# Patient Record
Sex: Female | Born: 1935
Health system: Southern US, Community
[De-identification: ages and names within clinical notes are randomized; demographics above are authoritative.]

## PROBLEM LIST (undated history)

## (undated) DIAGNOSIS — E559 Vitamin D deficiency, unspecified: Secondary | ICD-10-CM

## (undated) DIAGNOSIS — E039 Hypothyroidism, unspecified: Secondary | ICD-10-CM

## (undated) DIAGNOSIS — C801 Malignant (primary) neoplasm, unspecified: Secondary | ICD-10-CM

## (undated) DIAGNOSIS — K648 Other hemorrhoids: Secondary | ICD-10-CM

## (undated) DIAGNOSIS — I428 Other cardiomyopathies: Secondary | ICD-10-CM

## (undated) DIAGNOSIS — D509 Iron deficiency anemia, unspecified: Secondary | ICD-10-CM

## (undated) DIAGNOSIS — K5732 Diverticulitis of large intestine without perforation or abscess without bleeding: Secondary | ICD-10-CM

## (undated) DIAGNOSIS — C4490 Unspecified malignant neoplasm of skin, unspecified: Secondary | ICD-10-CM

## (undated) DIAGNOSIS — Z90710 Acquired absence of both cervix and uterus: Secondary | ICD-10-CM

## (undated) DIAGNOSIS — Z5111 Encounter for antineoplastic chemotherapy: Secondary | ICD-10-CM

## (undated) DIAGNOSIS — E785 Hyperlipidemia, unspecified: Secondary | ICD-10-CM

## (undated) DIAGNOSIS — I10 Essential (primary) hypertension: Secondary | ICD-10-CM

## (undated) DIAGNOSIS — I447 Left bundle-branch block, unspecified: Secondary | ICD-10-CM

## (undated) DIAGNOSIS — K219 Gastro-esophageal reflux disease without esophagitis: Secondary | ICD-10-CM

## (undated) DIAGNOSIS — C8589 Other specified types of non-Hodgkin lymphoma, extranodal and solid organ sites: Secondary | ICD-10-CM

## (undated) HISTORY — DX: Other specified types of non-hodgkin lymphoma, extranodal and solid organ sites: C85.89

## (undated) HISTORY — DX: Hyperlipidemia, unspecified: E78.5

## (undated) HISTORY — DX: Diverticulitis of large intestine without perforation or abscess without bleeding: K57.32

## (undated) HISTORY — DX: Left bundle-branch block, unspecified: I44.7

## (undated) HISTORY — DX: Other cardiomyopathies: I42.8

## (undated) HISTORY — DX: Gastro-esophageal reflux disease without esophagitis: K21.9

## (undated) HISTORY — DX: Other hemorrhoids: K64.8

## (undated) HISTORY — DX: Malignant (primary) neoplasm, unspecified: C80.1

## (undated) HISTORY — DX: Vitamin D deficiency, unspecified: E55.9

## (undated) HISTORY — DX: Unspecified malignant neoplasm of skin, unspecified: C44.90

## (undated) HISTORY — DX: Essential (primary) hypertension: I10

## (undated) HISTORY — PX: OTHER SURGICAL HISTORY: SHX169

## (undated) HISTORY — PX: TOE SURGERY: SHX1073

## (undated) HISTORY — DX: Hypothyroidism, unspecified: E03.9

## (undated) HISTORY — DX: Iron deficiency anemia, unspecified: D50.9

## (undated) HISTORY — DX: Acquired absence of both cervix and uterus: Z90.710

---
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
## 1975-07-07 DIAGNOSIS — Z90710 Acquired absence of both cervix and uterus: Secondary | ICD-10-CM

## 1975-07-07 HISTORY — DX: Acquired absence of both cervix and uterus: Z90.710

## 2002-12-18 ENCOUNTER — Encounter: Admission: RE | Admit: 2002-12-18 | Discharge: 2002-12-18 | Payer: Self-pay | Admitting: Orthopaedic Surgery

## 2002-12-18 ENCOUNTER — Encounter: Payer: Self-pay | Admitting: Orthopaedic Surgery

## 2002-12-19 ENCOUNTER — Ambulatory Visit (HOSPITAL_BASED_OUTPATIENT_CLINIC_OR_DEPARTMENT_OTHER): Admission: RE | Admit: 2002-12-19 | Discharge: 2002-12-19 | Payer: Self-pay | Admitting: Orthopaedic Surgery

## 2003-04-26 ENCOUNTER — Encounter: Admission: RE | Admit: 2003-04-26 | Discharge: 2003-07-03 | Payer: Self-pay | Admitting: Orthopaedic Surgery

## 2005-05-01 ENCOUNTER — Ambulatory Visit: Payer: Self-pay | Admitting: Cardiology

## 2005-05-19 ENCOUNTER — Ambulatory Visit: Payer: Self-pay

## 2005-05-19 ENCOUNTER — Encounter: Payer: Self-pay | Admitting: Cardiology

## 2006-03-11 ENCOUNTER — Ambulatory Visit: Payer: Self-pay | Admitting: Gastroenterology

## 2006-03-17 ENCOUNTER — Ambulatory Visit: Payer: Self-pay | Admitting: Gastroenterology

## 2006-03-31 ENCOUNTER — Ambulatory Visit: Payer: Self-pay | Admitting: Cardiology

## 2006-04-06 ENCOUNTER — Encounter: Payer: Self-pay | Admitting: Cardiovascular Disease

## 2006-04-06 ENCOUNTER — Ambulatory Visit: Payer: Self-pay

## 2006-12-01 ENCOUNTER — Ambulatory Visit: Payer: Self-pay | Admitting: Gastroenterology

## 2007-04-12 ENCOUNTER — Ambulatory Visit: Payer: Self-pay | Admitting: Cardiology

## 2007-04-12 LAB — CONVERTED CEMR LAB
ALT: 21 units/L (ref 0–35)
Albumin: 3.6 g/dL (ref 3.5–5.2)
BUN: 15 mg/dL (ref 6–23)
Bilirubin, Direct: 0.1 mg/dL (ref 0.0–0.3)
Cholesterol: 112 mg/dL (ref 0–200)
Creatinine, Ser: 0.6 mg/dL (ref 0.4–1.2)
Glucose, Bld: 96 mg/dL (ref 70–99)
HDL: 53.9 mg/dL (ref >39.0)
Potassium: 3.8 meq/L (ref 3.5–5.1)
Sodium: 145 meq/L (ref 135–145)

## 2007-04-19 ENCOUNTER — Ambulatory Visit: Payer: Self-pay | Admitting: Cardiology

## 2007-07-20 ENCOUNTER — Ambulatory Visit: Payer: Self-pay | Admitting: Gastroenterology

## 2007-08-02 ENCOUNTER — Ambulatory Visit: Payer: Self-pay | Admitting: Cardiology

## 2007-08-02 LAB — CONVERTED CEMR LAB
AST: 20 units/L (ref 0–37)
Cholesterol: 222 mg/dL (ref 0–200)
Total CHOL/HDL Ratio: 4.4
Triglycerides: 217 mg/dL (ref 0–149)
VLDL: 43 mg/dL — ABNORMAL HIGH (ref 0–40)

## 2007-09-13 ENCOUNTER — Ambulatory Visit: Payer: Self-pay | Admitting: Cardiology

## 2007-09-13 LAB — CONVERTED CEMR LAB
AST: 18 units/L (ref 0–37)
Albumin: 3.4 g/dL — ABNORMAL LOW (ref 3.5–5.2)
HDL: 54.4 mg/dL (ref >39.0)
Triglycerides: 118 mg/dL (ref 0–149)
VLDL: 24 mg/dL (ref 0–40)

## 2007-12-09 DIAGNOSIS — K5732 Diverticulitis of large intestine without perforation or abscess without bleeding: Secondary | ICD-10-CM | POA: Insufficient documentation

## 2007-12-09 DIAGNOSIS — I428 Other cardiomyopathies: Secondary | ICD-10-CM | POA: Insufficient documentation

## 2007-12-09 DIAGNOSIS — I10 Essential (primary) hypertension: Secondary | ICD-10-CM | POA: Insufficient documentation

## 2007-12-09 DIAGNOSIS — Z8572 Personal history of non-Hodgkin lymphomas: Secondary | ICD-10-CM | POA: Insufficient documentation

## 2007-12-09 DIAGNOSIS — I447 Left bundle-branch block, unspecified: Secondary | ICD-10-CM | POA: Insufficient documentation

## 2007-12-09 DIAGNOSIS — E785 Hyperlipidemia, unspecified: Secondary | ICD-10-CM | POA: Insufficient documentation

## 2007-12-09 DIAGNOSIS — I5020 Unspecified systolic (congestive) heart failure: Secondary | ICD-10-CM | POA: Insufficient documentation

## 2008-04-23 ENCOUNTER — Ambulatory Visit: Payer: Self-pay | Admitting: Cardiology

## 2008-04-23 LAB — CONVERTED CEMR LAB
ALT: 14 units/L (ref 0–35)
Bilirubin, Direct: 0.1 mg/dL (ref 0.0–0.3)
CO2: 32 meq/L (ref 19–32)
Cholesterol: 270 mg/dL (ref 0–200)
Direct LDL: 157.9 mg/dL
Glucose, Bld: 92 mg/dL (ref 70–99)
HDL: 46.5 mg/dL (ref >39.0)
Potassium: 3.9 meq/L (ref 3.5–5.1)
Sodium: 141 meq/L (ref 135–145)
Total Protein: 6.6 g/dL (ref 6.0–8.3)
VLDL: 38 mg/dL (ref 0–40)

## 2008-04-25 ENCOUNTER — Ambulatory Visit: Payer: Self-pay | Admitting: Cardiology

## 2008-06-05 ENCOUNTER — Ambulatory Visit: Payer: Self-pay | Admitting: Cardiology

## 2008-06-05 LAB — CONVERTED CEMR LAB
ALT: 17 units/L (ref 0–35)
AST: 23 units/L (ref 0–37)
Albumin: 3.4 g/dL — ABNORMAL LOW (ref 3.5–5.2)
Cholesterol: 138 mg/dL (ref 0–200)
Total Bilirubin: 0.7 mg/dL (ref 0.3–1.2)

## 2008-11-26 ENCOUNTER — Encounter: Payer: Self-pay | Admitting: Cardiology

## 2009-02-12 ENCOUNTER — Encounter (INDEPENDENT_AMBULATORY_CARE_PROVIDER_SITE_OTHER): Payer: Self-pay | Admitting: *Deleted

## 2009-04-24 ENCOUNTER — Ambulatory Visit: Payer: Self-pay | Admitting: Cardiology

## 2009-04-30 ENCOUNTER — Encounter (INDEPENDENT_AMBULATORY_CARE_PROVIDER_SITE_OTHER): Payer: Self-pay | Admitting: *Deleted

## 2009-04-30 LAB — CONVERTED CEMR LAB
BUN: 14 mg/dL (ref 6–23)
Bilirubin, Direct: 0 mg/dL (ref 0.0–0.3)
Calcium: 9 mg/dL (ref 8.4–10.5)
Cholesterol: 134 mg/dL (ref 0–200)
GFR calc non Af Amer: 102.34 mL/min (ref >60)
Glucose, Bld: 89 mg/dL (ref 70–99)
HDL: 50.3 mg/dL (ref >39.00)
Sodium: 144 meq/L (ref 135–145)
Total Bilirubin: 0.7 mg/dL (ref 0.3–1.2)
Total Protein: 6.2 g/dL (ref 6.0–8.3)
Triglycerides: 132 mg/dL (ref 0.0–149.0)

## 2009-05-02 ENCOUNTER — Ambulatory Visit: Payer: Self-pay | Admitting: Cardiology

## 2009-05-20 ENCOUNTER — Ambulatory Visit: Payer: Self-pay

## 2009-05-20 ENCOUNTER — Ambulatory Visit: Payer: Self-pay | Admitting: Internal Medicine

## 2009-05-20 ENCOUNTER — Encounter: Payer: Self-pay | Admitting: Cardiology

## 2009-05-20 ENCOUNTER — Ambulatory Visit (HOSPITAL_COMMUNITY): Admission: RE | Admit: 2009-05-20 | Discharge: 2009-05-20 | Payer: Self-pay | Admitting: Cardiology

## 2009-05-23 ENCOUNTER — Telehealth (INDEPENDENT_AMBULATORY_CARE_PROVIDER_SITE_OTHER): Payer: Self-pay

## 2009-05-27 ENCOUNTER — Ambulatory Visit: Payer: Self-pay | Admitting: Cardiovascular Disease

## 2009-05-27 ENCOUNTER — Ambulatory Visit: Payer: Self-pay

## 2009-05-27 ENCOUNTER — Encounter (HOSPITAL_COMMUNITY): Admission: RE | Admit: 2009-05-27 | Discharge: 2009-07-03 | Payer: Self-pay | Admitting: Cardiology

## 2009-06-10 ENCOUNTER — Telehealth: Payer: Self-pay | Admitting: Gastroenterology

## 2009-06-11 ENCOUNTER — Ambulatory Visit: Payer: Self-pay | Admitting: Internal Medicine

## 2009-06-11 DIAGNOSIS — K5900 Constipation, unspecified: Secondary | ICD-10-CM | POA: Insufficient documentation

## 2009-06-11 DIAGNOSIS — R12 Heartburn: Secondary | ICD-10-CM | POA: Insufficient documentation

## 2009-06-19 LAB — CONVERTED CEMR LAB
Basophils Absolute: 0.1 10*3/uL (ref 0.0–0.1)
Basophils Relative: 0.6 % (ref 0.0–3.0)
Eosinophils Relative: 0.4 % (ref 0.0–5.0)
MCHC: 34.7 g/dL (ref 30.0–36.0)
MCV: 94.2 fL (ref 78.0–100.0)
Monocytes Absolute: 0.7 10*3/uL (ref 0.1–1.0)
Neutro Abs: 7.6 10*3/uL (ref 1.4–7.7)
RBC: 4.35 M/uL (ref 3.87–5.11)
WBC: 11 10*3/uL — ABNORMAL HIGH (ref 4.5–10.5)

## 2009-07-10 ENCOUNTER — Ambulatory Visit: Payer: Self-pay | Admitting: Gastroenterology

## 2009-07-18 ENCOUNTER — Telehealth: Payer: Self-pay | Admitting: Cardiology

## 2009-09-30 ENCOUNTER — Telehealth: Payer: Self-pay | Admitting: Cardiology

## 2009-10-10 ENCOUNTER — Telehealth: Payer: Self-pay | Admitting: Cardiology

## 2010-02-04 ENCOUNTER — Telehealth: Payer: Self-pay | Admitting: Cardiology

## 2010-02-11 ENCOUNTER — Ambulatory Visit: Payer: Self-pay | Admitting: Cardiology

## 2010-02-12 LAB — CONVERTED CEMR LAB
Calcium: 8.9 mg/dL (ref 8.4–10.5)
GFR calc non Af Amer: 120.47 mL/min (ref >60)
Glucose, Bld: 95 mg/dL (ref 70–99)
Sodium: 140 meq/L (ref 135–145)

## 2010-04-14 ENCOUNTER — Ambulatory Visit: Payer: Self-pay | Admitting: Cardiology

## 2010-04-24 ENCOUNTER — Telehealth: Payer: Self-pay | Admitting: Gastroenterology

## 2010-08-03 LAB — CONVERTED CEMR LAB
CO2: 30 meq/L (ref 19–32)
Chloride: 103 meq/L (ref 96–112)
Glucose, Bld: 83 mg/dL (ref 70–99)
Potassium: 4.4 meq/L (ref 3.5–5.1)
Sodium: 140 meq/L (ref 135–145)

## 2010-08-07 NOTE — Progress Notes (Signed)
Summary: refill-please resend   Phone Note Refill Request Message from:  Patient on July 18, 2009 10:39 AM  Refills Requested: Medication #1:  crestor 5mg  [facility]    Method Requested: Telephone to Pharmacy Initial call taken by: Migdalia Dk,  July 18, 2009 10:40 AM  Follow-up for Phone Call        called in medication to pharmacy 1 year supply. called and told pt it was sent in Follow-up by: Kem Parkinson,  July 18, 2009 11:05 AM

## 2010-08-07 NOTE — Progress Notes (Signed)
Summary: Pt request call  Medications Added LISINOPRIL 10 MG TABS (LISINOPRIL) Take one tablet by mouth daily       Phone Note Call from Patient Call back at [facility] Phone [phone]   Caller: Patient Summary of Call: Pt request call Initial call taken by: Judie Grieve,  February 04, 2010 10:08 AM  Follow-up for Phone Call        [place] Scherrie Bateman, LPN  February 04, 2010 5:34 PM Pt returning call Judie Grieve  February 05, 2010 8:36 AM Left message to call back Deliah Goody, RN  February 05, 2010 2:31 PM  pt is c/o cramping in her feet and legs. sometimes they are so bad her legs are sore the next day.  she has not taken the lisinopril HCT yesterday or today and the cramping is better. she does not track her bp at home. will foward for dr Jens Som review Deliah Goody, RN  February 05, 2010 3:04 PM   Additional Follow-up for Phone Call Additional follow up Details #1::        dc lisinopril hct and treat with lisinopril 10 mg by mouth daily; bmet one week Ferman Hamming, MD, [facility]  February 05, 2010 3:48 PM  spoke with pt, aware of med change and will come by next week for labs Deliah Goody, RN  February 05, 2010 4:26 PM\par    New/Updated Medications: LISINOPRIL 10 MG TABS (LISINOPRIL) Take one tablet by mouth daily Prescriptions: LISINOPRIL 10 MG TABS (LISINOPRIL) Take one tablet by mouth daily  #30 x 12   Entered by:   Deliah Goody, RN   Authorized by:   Ferman Hamming, MD, [facility]   Signed by:   Deliah Goody, RN on 02/05/2010   Method used:   Faxed to ...       [id number] (retail)       [address]       [place], [place]  [address]       Ph: [phone]       Fax: [phone]   RxID:   [id number]

## 2010-08-07 NOTE — Assessment & Plan Note (Signed)
Summary: yearly/sl  Medications Added METOPROLOL SUCCINATE 25 MG XR24H-TAB (METOPROLOL SUCCINATE) Take one tablet by mouth daily        Primary Tessy Pawelski:  Nolon Nations, MD  CC:  yearly check up.  History of Present Illness: Cindy Mccormick is a pleasant  female with a history of nonischemic cardiomyopathy improved by most recent echocardiogram.  Previous Myoview in February 2003 showed a small nonreversible apical defect. No ischemia. Last echocardiogram was performed in November of 2010. This showed an ejection fraction of 35-40%. However a followup MUGA revealed an ejection fraction of 50% with mild apical hypokinesis. I last saw her in October of 2010. Since then the patient denies any dyspnea on exertion, orthopnea, PND, pedal edema, palpitations, syncope or chest pain. She does occasionally have leg cramping.   Current Medications (verified): 1)  Lisinopril 10 Mg Tabs (Lisinopril) .... Take One Tablet By Mouth Daily 2)  Multivitamins   Tabs (Multiple Vitamin) .... Tab By Mouth Once Daily 3)  Icaps  Caps (Multiple Vitamins-Minerals) .Marland Kitchen.. 1 Tab By Mouth Two Times A Day  Allergies: 1)  ! Furadantin (Nitrofurantoin) 2)  ! Sulfa 3)  ! * Oxycodone 4)  ! * Oxycontin 5)  ! Vicodin 6)  ! Zetia (Ezetimibe)  Past History:  Past Medical History: CARDIOMYOPATHY (ICD-425.4) HYPERTENSION (ICD-401.9) HYPERLIPIDEMIA (ICD-272.4) LEFT BUNDLE BRANCH BLOCK (ICD-426.3) HEMORRHOIDS, INTERNAL (ICD-455.0) Hx of DIVERTICULITIS, COLON (ICD-562.11) Hx of LYMPHOMA (ICD-202.80)  Past Surgical History: Reviewed history from 06/11/2009 and no changes required. Appendectomy1949 Hysterectomy1977 Lymphoma biopsies Toe surgery  Social History: Reviewed history from 07/10/2009 and no changes required.  She does not smoke.  She drinks rarely.  She is widowed, and retired.    Daily Caffeine Use drinks 1/2 and 1/2  Review of Systems       Frequent urination at night but no fevers or chills, productive  cough, hemoptysis, dysphasia, odynophagia, melena, hematochezia, dysuria, hematuria, rash, seizure activity, orthopnea, PND, pedal edema, claudication. Remaining systems are negative.   Vital Signs:  Patient profile:   77 year old female Height:      62 inches Weight:      124 pounds BMI:     22.76 Pulse rate:   70 / minute Resp:     12 per minute BP sitting:   150 / 72  (left arm)  Vitals Entered By: Kem Parkinson (April 14, 2010 9:33 AM)  Physical Exam  General:  Well-developed well-nourished in no acute distress.  Skin is warm and dry.  HEENT is normal.  Neck is supple. No thyromegaly.  Chest is clear to auscultation with normal expansion.  Cardiovascular exam is regular rate and rhythm.  Abdominal exam nontender or distended. No masses palpated. Extremities show no edema. neuro grossly intact    EKG  Procedure date:  04/14/2010  Findings:      Sinus rhythm at a rate of 70. Left bundle branch block.  Impression & Recommendations:  Problem # 1:  CARDIOMYOPATHY (ICD-425.4)  LV function mildly reduced. Continue ACE inhibitor. Check potassium and renal function. Add Toprol 25 mg p.o. daily. The following medications were removed from the medication list:    Carvedilol 6.25 Mg Tabs (Carvedilol) .Marland Kitchen... Take one tablet by mouth twice a day Her updated medication list for this problem includes:    Lisinopril 10 Mg Tabs (Lisinopril) .Marland Kitchen... Take one tablet by mouth daily    Metoprolol Succinate 25 Mg Xr24h-tab (Metoprolol succinate) .Marland Kitchen... Take one tablet by mouth daily  The following medications were removed  from the medication list:    Carvedilol 6.25 Mg Tabs (Carvedilol) .Marland Kitchen... Take one tablet by mouth twice a day Her updated medication list for this problem includes:    Lisinopril 10 Mg Tabs (Lisinopril) .Marland Kitchen... Take one tablet by mouth daily    Metoprolol Succinate 25 Mg Xr24h-tab (Metoprolol succinate) .Marland Kitchen... Take one tablet by mouth daily  Problem # 2:  HYPERTENSION  (ICD-401.9)  Blood pressure mildly elevated. At Toprol as described above. The following medications were removed from the medication list:    Carvedilol 6.25 Mg Tabs (Carvedilol) .Marland Kitchen... Take one tablet by mouth twice a day Her updated medication list for this problem includes:    Lisinopril 10 Mg Tabs (Lisinopril) .Marland Kitchen... Take one tablet by mouth daily    Metoprolol Succinate 25 Mg Xr24h-tab (Metoprolol succinate) .Marland Kitchen... Take one tablet by mouth daily  Orders: TLB-BMP (Basic Metabolic Panel-BMET) (80048-METABOL)  The following medications were removed from the medication list:    Carvedilol 6.25 Mg Tabs (Carvedilol) .Marland Kitchen... Take one tablet by mouth twice a day Her updated medication list for this problem includes:    Lisinopril 10 Mg Tabs (Lisinopril) .Marland Kitchen... Take one tablet by mouth daily    Metoprolol Succinate 25 Mg Xr24h-tab (Metoprolol succinate) .Marland Kitchen... Take one tablet by mouth daily  Problem # 3:  HYPERLIPIDEMIA (ICD-272.4) Management per primary care.  Problem # 4:  Hx of LYMPHOMA (ICD-202.80) Followed at [facility].  Patient Instructions: 1)  Your physician recommends that you schedule a follow-up appointment in: ONE YEAR 2)  Your physician has recommended you make the following change in your medication: START METOPROLOL SUCC 25MG  ONE TABLET ONCE DAILY Prescriptions: LISINOPRIL 10 MG TABS (LISINOPRIL) Take one tablet by mouth daily  #90 x 12   Entered by:   Deliah Goody, RN   Authorized by:   Ferman Hamming, MD, [facility]   Signed by:   Deliah Goody, RN on 04/14/2010   Method used:   Electronically to        [facility] (retail)       [address]       [place], [place]  [address]       Ph: [phone]       Fax: [phone]   RxID:   [id number] METOPROLOL SUCCINATE 25 MG XR24H-TAB (METOPROLOL SUCCINATE) Take one tablet by mouth daily  #30 x 12   Entered by:   Deliah Goody, RN   Authorized by:   Ferman Hamming, MD, [facility]   Signed by:   Deliah Goody, RN  on 04/14/2010   Method used:   Electronically to        [facility] (retail)       [address]       [place], [place]  [address]       Ph: [phone]       Fax: [phone]   RxID:   [id number]

## 2010-08-07 NOTE — Assessment & Plan Note (Signed)
Summary: F/U/ nausea    History of Present Illness Visit Type: Follow-up Visit Primary GI MD: Melvia Heaps MD [facility] Primary Provider: Nolon Nations, MD Chief Complaint: follow-up abdominal pain h/o diverticulitis  pt. states she is feeling better History of Present Illness:   Cindy Mccormick has returned for followup of her abdominal pain, nausea and vomiting.  She received a course of Cipro and Flagyl with resolution of her symptoms.  She described tightness, pain, nausea and vomiting .  Over  3-4 days she did not move her bowels She has known diverticular disease and was presumed to have acute diverticulitis.   GI Review of Systems      Denies abdominal pain, acid reflux, belching, bloating, chest pain, dysphagia with liquids, dysphagia with solids, heartburn, loss of appetite, nausea, vomiting, vomiting blood, weight loss, and  weight gain.        Denies anal fissure, black tarry stools, change in bowel habit, constipation, diarrhea, diverticulosis, fecal incontinence, heme positive stool, hemorrhoids, irritable bowel syndrome, jaundice, light color stool, liver problems, rectal bleeding, and  rectal pain.    Current Medications (verified): 1)  Lisinopril-Hydrochlorothiazide 10-12.5 Mg Tabs (Lisinopril-Hydrochlorothiazide) .Marland Kitchen.. 1 Tab By Mouth Once Daily 2)  Multivitamins   Tabs (Multiple Vitamin) .... Tab By Mouth Once Daily 3)  Icaps  Caps (Multiple Vitamins-Minerals) .Marland Kitchen.. 1 Tab By Mouth Two Times A Day 4)  Metoprolol Succinate 25 Mg Xr24h-Tab (Metoprolol Succinate) .... Take One Tablet By Mouth Daily 5)  Promethazine Hcl 12.5 Mg Tabs (Promethazine Hcl) .... Take One By Mouth Every 4-6 Hours As Needed For Nausea. 6)  Cipro 250 Mg Tabs (Ciprofloxacin Hcl) .... Take 1 Tab Twice Daily X 10 Days 7)  Flagyl 500 Mg Tabs (Metronidazole) .... Take 1 Tab 3 Times Daily X 10 Days 8)  Prochlorperazine Maleate 10 Mg Tabs (Prochlorperazine Maleate) .... Take Every 4-6 Hours As Needed For  Nausea  Allergies (verified): 1)  ! Furadantin (Nitrofurantoin) 2)  ! Sulfa 3)  ! * Oxycodone 4)  ! * Oxycontin 5)  ! Vicodin 6)  ! Zetia (Ezetimibe)  Past History:  Past Medical History: Reviewed history from 05/01/2009 and no changes required. Current Problems:  CARDIOMYOPATHY (ICD-425.4) HYPERTENSION (ICD-401.9) HYPERLIPIDEMIA (ICD-272.4) LEFT BUNDLE BRANCH BLOCK (ICD-426.3) HEMORRHOIDS, INTERNAL (ICD-455.0) Hx of DIVERTICULITIS, COLON (ICD-562.11) ENCOUNTER FOR LONG-TERM USE OF OTHER MEDICATIONS (ICD-V58.69) Hx of LYMPHOMA (ICD-202.80)  Past Surgical History: Reviewed history from 06/11/2009 and no changes required. Appendectomy1949 Hysterectomy1977 Lymphoma biopsies Toe surgery  Family History: Reviewed history from 06/11/2009 and no changes required.   Pertinent for mother with lymphoma.  No FH of Colon Cancer:  Social History: Reviewed history from 05/01/2009 and no changes required.  She does not smoke.  She drinks rarely.  She is widowed, and retired.    Daily Caffeine Use drinks 1/2 and 1/2  Review of Systems       The patient complains of sleeping problems and urination - excessive.  The patient denies allergy/sinus, anemia, anxiety-new, arthritis/joint pain, back pain, blood in urine, breast changes/lumps, change in vision, confusion, cough, coughing up blood, depression-new, fainting, fatigue, fever, headaches-new, hearing problems, heart murmur, heart rhythm changes, itching, muscle pains/cramps, night sweats, nosebleeds, shortness of breath, skin rash, sore throat, swelling of feet/legs, swollen lymph glands, thirst - excessive, urination changes/pain, urine leakage, vision changes, and voice change.    Vital Signs:  Patient profile:   77 year old female Height:      62 inches Weight:      125  pounds BMI:     22.95 Pulse rate:   80 / minute Pulse rhythm:   regular BP sitting:   140 / 70  (left arm)  Vitals Entered By: Milford Cage NCMA (July 10, 2009 9:00 AM)   Impression & Recommendations:  Problem # 1:  ABDOMINAL PAIN -GENERALIZED (ICD-789.07) Assessment Improved Though she was treated for acute diverticulitis, symptoms also suggest possible bowel obstruction.  In either case, symptoms have resolved.  Recommendations #1 if patient develops recurrent symptoms I would proceed with plain films of the abdomen and possible CT of the abdomen  Patient Instructions: 1)  CC Dr. Nolon Nations

## 2010-08-07 NOTE — Progress Notes (Signed)
Summary: PT HAVING PROBLEMS WITH NEW MEDICATION   Phone Note Call from Patient   Caller: Patient Summary of Call: PT CALLING PROBLEMS WITH THE NEW MEDICATION Initial call taken by: Judie Grieve,  October 10, 2009 4:23 PM  Follow-up for Phone Call        spoke with pt, on monday started coreg. she is having trouble sleeping. alittle itching and she just doesn't feel well. pt wants to stop the coreg, okay given for pt to stop coreg. will foward to dr Jens Som for his review Deliah Goody, RN  October 10, 2009 5:48 PM\par  Additional Follow-up for Phone Call Additional follow up Details #1::        Continue off coreg Ferman Hamming, MD, [facility]  October 12, 2009 4:16 PM Left message to call back Deliah Goody, RN  October 15, 2009 11:01 AM  pt aware Deliah Goody, RN  October 15, 2009 1:58 PM\par

## 2010-08-07 NOTE — Progress Notes (Signed)
Summary: SIDE EFFCET FROM MEDICATION  Medications Added CARVEDILOL 6.25 MG TABS (CARVEDILOL) Take one tablet by mouth twice a day       Phone Note Call from Patient Call back at Home Phone [phone] Call back at [phone]   Caller: Patient Summary of Call: SIDE EFFECT FROM MEDICATION( METOPROLOL) Initial call taken by: Judie Grieve,  September 30, 2009 8:33 AM  Follow-up for Phone Call        She has been having side effects since Dec.  Unable to sleep and itching 30 min after taking the pill.  At first she just thought it was because the loss of her husband and daughter and then the holidays but now she thinks its the pill.  Will ask Dr Jens Som tomorrow what other medication he wants to try and call her back.  If she is not at home ok to leave a message. Dennis Bast, RN, BSN  September 30, 2009 3:11 PM  Additional Follow-up for Phone Call Additional follow up Details #1::        dc toprol; coreg 6.25 mg by mouth two times a day Ferman Hamming, MD, [facility]  September 30, 2009 4:09 PM Left message to call back Deliah Goody, RN  October 01, 2009 4:03 PM Pt returning call about medication call pt at [phone] [facility]  October 02, 2009 8:17 AM  spoke with pt, she is aware of the med change and will call if she has any further problems Deliah Goody, RN  October 04, 2009 4:25 PM      New/Updated Medications: CARVEDILOL 6.25 MG TABS (CARVEDILOL) Take one tablet by mouth twice a day Prescriptions: CARVEDILOL 6.25 MG TABS (CARVEDILOL) Take one tablet by mouth twice a day  #60 x 12   Entered by:   Deliah Goody, RN   Authorized by:   Ferman Hamming, MD, [facility]   Signed by:   Deliah Goody, RN on 10/04/2009   Method used:   Electronically to        [facility] [address] (retail)       [address] [place], [place]  [address]       Ph: [phone] or [phone]       Fax: [phone]   RxID:   [id number]

## 2010-08-07 NOTE — Progress Notes (Signed)
Summary: loose stool   Phone Note Call from Patient Call back at Home Phone [phone]   Caller: Patient Call For: Dr. Arlyce Dice Reason for Call: Talk to Nurse Summary of Call: starting Monday morning, pt has had very loose stool Initial call taken by: Vallarie Mare,  April 24, 2010 9:37 AM  Follow-up for Phone Call        Patient c/o a few days of loose stool.  Stools are semiformed.  She  is advised to start on a bland diet, Imodium or Pepto as directed on the box/bottle.  She is asked to call back if her symptoms don't improve.   Follow-up by: Darcey Nora RN, CGRN,  April 24, 2010 9:54 AM  Additional Follow-up for Phone Call Additional follow up Details #1::        ok Additional Follow-up by: Louis Meckel MD,  April 24, 2010 12:13 PM

## 2010-09-05 ENCOUNTER — Encounter: Payer: Self-pay | Admitting: Cardiovascular Disease

## 2010-09-05 DIAGNOSIS — I739 Peripheral vascular disease, unspecified: Secondary | ICD-10-CM | POA: Insufficient documentation

## 2010-09-09 ENCOUNTER — Encounter (INDEPENDENT_AMBULATORY_CARE_PROVIDER_SITE_OTHER): Payer: Medicare Other

## 2010-09-09 ENCOUNTER — Encounter: Payer: Self-pay | Admitting: Cardiovascular Disease

## 2010-09-09 DIAGNOSIS — I739 Peripheral vascular disease, unspecified: Secondary | ICD-10-CM

## 2010-09-09 DIAGNOSIS — M79609 Pain in unspecified limb: Secondary | ICD-10-CM

## 2010-09-11 NOTE — Miscellaneous (Signed)
Summary: Orders Update  Clinical Lists Changes  Problems: Added new problem of UNSPECIFIED PERIPHERAL VASCULAR DISEASE (ICD-443.9) Orders: Added new Test order of Arterial Duplex Lower Extremity (Arterial Duplex Low) - Signed 

## 2010-09-30 ENCOUNTER — Telehealth: Payer: Self-pay | Admitting: Cardiology

## 2010-09-30 NOTE — Telephone Encounter (Signed)
All Cardiac faxed to Jodie @ [facility] @ [phone] 09/30/10/KM

## 2010-09-30 NOTE — Telephone Encounter (Signed)
Faxed MUGA to [facility] @ [facility] ([phone])

## 2010-11-18 NOTE — Assessment & Plan Note (Signed)
[facility]                         GASTROENTEROLOGY OFFICE NOTE   NAME:Cindy Mccormick, Cindy Mccormick                     MRN:          [id number]  DATE:07/20/2007                            DOB:          June 29, 1928    PROBLEM:  Diverticulitis.   [name] has returned for re-evaluation.  Approximately two weeks ago,  she developed severe left lower quadrant pain.  She was empirically  started on Cipro and Flagyl.  The patient eventually subsided.  This is  unlike her previous pain from the standpoint of severity.  She currently  is feeling well and is back to baseline.   EXAMINATION:  VITAL SIGNS:  Pulse 68, blood pressure 110/68, weight 128.  HEENT: EOMI.  PERRLA.  Sclerae are anicteric.  Conjunctivae are pink.  NECK:  Supple without thyromegaly, adenopathy or carotid bruits.  CHEST:  Clear to auscultation and percussion without adventitious  sounds.  CARDIAC:  Regular rhythm; normal S1 S2.  There are no murmurs, gallops  or rubs.  ABDOMEN:  Bowel sounds are normoactive.  Abdomen is soft, nontender and  nondistended.  There are no abdominal masses, tenderness, splenic  enlargement or hepatomegaly.  EXTREMITIES:  Full range of motion.  No cyanosis, clubbing or edema.  RECTAL:  Deferred.   IMPRESSION:  Acute diverticulitis - resolved.   RECOMMENDATIONS:  1. Continue Nu-Lev as needed.  2. Dietary restriction, with regard to seeds and berries.     Barbette Hair. Arlyce Dice, MD,FACG  Electronically Signed    RDK/MedQ  DD: 07/20/2007  DT: 07/20/2007  Job #: [id number]

## 2010-11-18 NOTE — Assessment & Plan Note (Signed)
[facility]                            CARDIOLOGY OFFICE NOTE   NAME:Cindy Mccormick, Cindy Mccormick                     MRN:          [id number]  DATE:04/19/2007                            DOB:          [birth year]    Cindy Mccormick is a pleasant 77 year old female who has a history of a  nonischemic cardiomyopathy felt secondary to chemotherapy, and also left  bundle branch block.  She also has hyperlipidemia.  Her most recent  echocardiogram was on April 06, 2006.  Her LV function had returned to  normal.  There was mild left atrial enlargement.  Since I last saw her,  there is no dyspnea, chest pain, palpitation, or syncope, and there is  no pedal edema.  However, she does have significant cramping in her  lower extremities.   MEDICATIONS:  Include:  1. Lisinopril/hydrochlorothiazide 10/12.5 mg p.o. daily.  2. Multivitamin daily.  3. Vytorin 10/40 daily.   PHYSICAL EXAMINATION:  Shows a blood pressure of 118/69.  Her pulse is  77.  She weighs 134 pounds.  HEENT:  Normal.  NECK:  Supple with no bruits.  CHEST:  Clear.  CARDIOVASCULAR EXAM:  Regular rate and rhythm.  ABDOMINAL EXAM:  Shows no pulsatile masses.  No bruits.  EXTREMITIES:  Show no edema.  Her electrocardiogram shows sinus rhythm with a left bundle branch  block.   DIAGNOSIS:  1. Nonischemic cardiomyopathy.  We will continue with her      lisinopril/hydrochlorothiazide.  Note, her most recent      echocardiogram showed that her LV function has returned to normal.  2. Left bundle branch block.  3. Hyperlipidemia.  Her most recent lipids and liver were outstanding.      However, she has developed possible myalgias.  I will discontinue      Vytorin.  If her symptoms improve in 4 weeks, then we will change      to Pravachol.  If her symptoms do not improve, then we will resume      her Vytorin.  4. Hypertension.  Her blood pressure is well controlled on the present      medications.  5. Remote  history of lymphoma.   We will see her back in 12 months.     Cindy Mccormick Cindy Som, MD, [facility]  Electronically Signed   BSC/MedQ  DD: 04/19/2007  DT: 04/20/2007  Job #: [id number]

## 2010-11-18 NOTE — Assessment & Plan Note (Signed)
[facility]                         GASTROENTEROLOGY OFFICE NOTE   NAME:Cindy Mccormick, Cindy Mccormick                     MRN:          [id number]  DATE:12/01/2006                            DOB:          1927/10/27    PROBLEM:  Abdominal pain.   Ms. Cindy Mccormick is a pleasant 77 year old white female who is here for  evaluation of abdominal pain.  On 2 occasions she has had severe crampy  abdominal pain in the left lower quadrant that was relieved by a bowel  movement.  On each occasion she had been eating large amounts of  strawberries.  She has extensive diverticulosis, especially in the left  colon, as demonstrated by colonoscopy in September 2007.  There is no  history of fever or hematochezia.   PAST MEDICAL HISTORY:  Pertinent for hypertension.  She has a history of  lymphoma, and nonischemic cardiomyopathy, felt secondary to  chemotherapy.  She is status post appendectomy and hysterectomy.   FAMILY HISTORY:  Pertinent for mother with lymphoma.   MEDICATIONS:  Include lisinopril and Vytorin.   ALLERGIES:  1. FURADANTIN.  2. SULFA.  3. OXYCODONE.  4. VICODIN.   She does not smoke.  She drinks rarely.  She is widowed, and retired.   REVIEW OF SYSTEMS:  Positive for loss of hearing.   PHYSICAL EXAMINATION:  Pulse 68.  Blood pressure 110/66.  Weight 129.  HEENT: EOMI. PERRLA. Sclerae are anicteric.  Conjunctivae are pink.  NECK:  Supple without thyromegaly, adenopathy or carotid bruits.  CHEST:  Clear to auscultation and percussion without adventitious  sounds.  CARDIAC:  Regular rhythm; normal S1 S2.  There are no murmurs, gallops  or rubs.  ABDOMEN:  Bowel sounds are normoactive.  Abdomen is soft, non-tender and  non-distended.  There are no abdominal masses, tenderness, splenic  enlargement or hepatomegaly.  EXTREMITIES:  Full range of motion.  No cyanosis, clubbing or edema.  RECTAL:  Deferred.   IMPRESSION:  Intermittent left lower quadrant pain.   This appears diet-  related.  I suspect this is related to her diverticulosis, and perhaps  intake of seeds.   RECOMMENDATION:  1. Avoid seeds and berries.  2. NuLev 0.25 mg sublingual p.r.n.     Barbette Hair. Cindy Dice, MD,FACG  Electronically Signed    RDK/MedQ  DD: 12/01/2006  DT: 12/01/2006  Job #: [id number]   cc:   Nolon Nations, MD

## 2010-11-18 NOTE — Assessment & Plan Note (Signed)
[facility]                            CARDIOLOGY OFFICE NOTE   NAME:Cindy Mccormick, Cindy Mccormick                     MRN:          [id number]  DATE:04/25/2008                            DOB:          05/16/1928    Cindy Mccormick is a pleasant 77 year old female with a history of nonischemic  cardiomyopathy improved by most recent echocardiogram.  This was on  April 06, 2006, which showed her LV function had returned to normal.  There was mild left atrial enlargement.  Since I last saw her, she is  doing well from symptomatic standpoint with no dyspnea, chest pain,  palpitations, or syncope.  She has had vertigo recently.  Note, she  discontinued her Pravachol as it was causing myalgias as well as Zetia  by her report.   MEDICATIONS:  At present include lisinopril and HCTZ 10/12.5 mg tablets  daily and multivitamin daily.   PHYSICAL EXAMINATION:  VITAL SIGNS:  Shows a blood pressure of 138/74  and her pulse is 69.  She weighs 128 pounds.  HEENT:  Normal.  NECK:  Supple.  CHEST:  Clear.  CARDIOVASCULAR:  Regular rhythm.  ABDOMEN:  Shows no tenderness.  EXTREMITIES:  Showed no edema.   Her electrocardiogram shows a sinus rhythm at a rate of 66.  There is  probable limb lead reversal.  There is a left bundle-branch block.   DIAGNOSES:  1. History of nonischemic cardiomyopathy - the patient's left      ventricular function has returned to normal.  She will continue on      lisinopril and HCTZ.  Her recent BMET showed normal potassium as      well as BUN and creatinine.  2. Left bundle-branch block.  3. Hyperlipidemia - she has not tolerated Vytorin or Pravachol.  I      will try Crestor 5 mg p.o. daily.  We will check lipids and liver      in 6 weeks and adjust as indicated.  Note, her recent cholesterol      was 270 with an LDL of 158.  Also note, she has not tolerated      Zetia.  4. Hypertension - blood pressure is adequately controlled.  5. Remote history  of lymphoma.   She will see [place] back in 12 months.     Madolyn Frieze Jens Som, MD, [facility]  Electronically Signed    BSC/MedQ  DD: 04/25/2008  DT: 04/26/2008  Job #: [id number]

## 2010-11-21 NOTE — Assessment & Plan Note (Signed)
[facility]                              CARDIOLOGY OFFICE NOTE   NAME:Cindy Mccormick, Cindy Mccormick                     MRN:          [id number]  DATE:03/31/2006                            DOB:          08-29-1927    Cindy Mccormick is a very pleasant 77 year old female, who has previously been  followed by Dr. Andee Lineman.  She has a history of nonischemic cardiomyopathy  felt secondary to chemotherapy and also a left bundle branch block.  She has  had her chemotherapy for a lymphoma in the past, which is apparently  quiescent.  Her most recent echocardiogram was performed on May 19, 2005.  Her ejection fraction was mild to moderately decreased, on the 40% to  45% range.  There was otherwise no significant abnormality noted.  Note she  did have a nuclear study performed in February of 2003 that showed a small,  nonreversible apical defect with no ischemia.  The ejection fraction was 56%  at that time.  Since she was last seen, she denies any dyspnea, chest pain,  palpitations or syncope.   MEDICATIONS:  1. Lisinopril HCT 10/12.5 mg tablets one p.o. daily.  2. Multivitamin 1 p.o. daily.   PHYSICAL EXAMINATION:  Shows a blood pressure of 120/78 and her pulse is 58.  She weighs 126 pounds.  NECK:  Supple with no bruits.  CHEST:  Clear.  CARDIOVASCULAR:  Exam reveals a regular rate and rhythm.  EXTREMITIES:  Show no edema.   Her electrocardiogram today shows a sinus rhythm at a rate of 58.  There is  a left bundle branch block.  It is unchanged from previous.   DIAGNOSES:  1. History of nonischemic cardiomyopathy, felt secondary to chemotherapy.  2. Left bundle branch block.  3. Remote history of lymphoma.  4. Hypertension.   PLAN:  Cindy Mccormick is doing well from a symptomatic standpoint.  Her blood  pressure is well controlled.  We will check a BMET today to follow her  potassium and renal function, given her ACE inhibitor and the diuretic use.  I will also  check a TSH and lipids.  We will repeat her echocardiogram to re-  quantify her LV function.  She will see me back in 1 year.  Note she is not  on a beta blocker, but her ejection fraction is in the 40% to 45% range, and  her heart rate is 58.  We will therefore not add this at this point.            ______________________________  Madolyn Frieze. Jens Som, MD, [facility]     BSC/MedQ  DD:  03/31/2006  DT:  04/02/2006  Job #:  [id number]

## 2010-11-21 NOTE — Op Note (Signed)
Cindy Mccormick, Cindy Mccormick                        ACCOUNT NO.:  [id number]   MEDICAL RECORD NO.:  [id number]                   PATIENT TYPE:  AMB   LOCATION:  DSC                                  FACILITY:  [facility]   PHYSICIAN:  Lubertha Basque. Jerl Santos, M.D.             DATE OF BIRTH:  [birth year]   DATE OF PROCEDURE:  12/19/2002  DATE OF DISCHARGE:                                 OPERATIVE REPORT   PREOPERATIVE DIAGNOSES:  1. Left knee joint lateral meniscus.  2. Left knee chondromalacia.   POSTOPERATIVE DIAGNOSES:  1. Left knee joint lateral meniscus.  2. Left knee chondromalacia.   PROCEDURE:  1. Partial lateral meniscectomy, left knee.  2. Left knee chondroplasty of the medial femoral condyle.   ANESTHESIA:  Knee block, MAC.   SURGEON:  Lubertha Basque. Jerl Santos, M.D.   ASSISTANT:  Lindwood Qua, P.A.   INDICATIONS FOR PROCEDURE:  The patient is a 77 year old woman with several  months of difficulty bending her left knee.  She has had pain with rest and  pain with activity, and cannot squat down.  She has failed oral anti-  inflammatories and a cortisone injection.  She is offered operative  intervention to consist of an arthroscopy.  An informed operative consent  was obtained after a discussion of the possible complications of, reaction  to anesthesia, and infection.   DESCRIPTION OF PROCEDURE:  The patient was taken to the operating suite  where a knee block was applied, along with MAC.  She was positioned supine  and prepped and draped in the normal sterile fashion.  After the  administration of preoperative IV antibiotics, an arthroscopy of the left  knee was performed through two inferior portals.  The suprapatellar pouch  was benign, as was the patellofemoral joint.  She had grade 3 changes of the  medial femoral condyle and medial compartment, addressed with a thorough  chondroplasty back to stable tissues.  Her medial meniscus appeared intact.  The ACL was intact.  The  lateral compartment exhibited a complex tear of the  posterior middle horns.  This was addressed with a 25% partial medial  meniscectomy.  Basically I removed almost the entire posterior horn back to  the popliteus.  The knee was thoroughly irrigated at the end of the case,  followed by the placement of Marcaine with epinephrine and Depo-Medrol.  Adaptic was applied to the wounds, followed by dry gauze and a loose Ace  wrap.  The estimated blood loss and intraoperative fluids can be obtained from the  anesthesia records.   DISPOSITION:  The patient is taken to the recovery room in stable condition.    PLAN:  For her to go home the same day and follow up in the office in less  than one week.  I will contact her by phone tonight.  Lubertha Basque Jerl Santos, M.D.    PGD/MEDQ  D:  12/19/2002  T:  12/19/2002  Job:  [id number]

## 2011-01-19 ENCOUNTER — Telehealth: Payer: Self-pay | Admitting: Cardiology

## 2011-01-19 DIAGNOSIS — E7800 Pure hypercholesterolemia, unspecified: Secondary | ICD-10-CM

## 2011-01-19 DIAGNOSIS — Z79899 Other long term (current) drug therapy: Secondary | ICD-10-CM

## 2011-01-19 NOTE — Telephone Encounter (Signed)
Pt would  Like to have lab work prior to appt in Oct.

## 2011-01-19 NOTE — Telephone Encounter (Signed)
Spoke with pt, orders placed for pt to have fasting labs prior to appt Cindy Mccormick

## 2011-04-14 ENCOUNTER — Encounter: Payer: Self-pay | Admitting: Cardiology

## 2011-04-14 ENCOUNTER — Encounter: Payer: Self-pay | Admitting: *Deleted

## 2011-04-15 ENCOUNTER — Encounter: Payer: Self-pay | Admitting: Cardiology

## 2011-04-15 ENCOUNTER — Ambulatory Visit (INDEPENDENT_AMBULATORY_CARE_PROVIDER_SITE_OTHER): Payer: Medicare Other | Admitting: Cardiology

## 2011-04-15 ENCOUNTER — Telehealth: Payer: Self-pay | Admitting: Cardiology

## 2011-04-15 DIAGNOSIS — I428 Other cardiomyopathies: Secondary | ICD-10-CM

## 2011-04-15 DIAGNOSIS — I10 Essential (primary) hypertension: Secondary | ICD-10-CM

## 2011-04-15 DIAGNOSIS — E785 Hyperlipidemia, unspecified: Secondary | ICD-10-CM

## 2011-04-15 MED ORDER — CARVEDILOL 3.125 MG PO TABS: 3.1250 mg | ORAL_TABLET | Freq: Two times a day (BID) | ORAL | Status: DC

## 2011-04-15 NOTE — Assessment & Plan Note (Signed)
Blood pressure mildly elevated. Add Coreg.

## 2011-04-15 NOTE — Assessment & Plan Note (Signed)
Plan continue ACE inhibitor. She did not tolerate Toprol because of itching. We'll try Coreg 3.125 mg p.o. B.i.d. Repeat echocardiogram.

## 2011-04-15 NOTE — Progress Notes (Signed)
HPI:Cindy Mccormick is a pleasant  female with a history of nonischemic cardiomyopathy improved by most recent echocardiogram.  Previous Myoview in February 2003 showed a small nonreversible apical defect. No ischemia. Last echocardiogram was performed in November of 2010. This showed an ejection fraction of 35-40%. However a followup MUGA revealed an ejection fraction of 50% with mild apical hypokinesis. I last saw her in October of 2011. Since then the patient denies any dyspnea on exertion, orthopnea, PND, pedal edema, palpitations, syncope or chest pain.   Current Outpatient Prescriptions  Medication Sig Dispense Refill  . cephALEXin (KEFLEX) 250 MG capsule Take 1 tablet by mouth Daily.        . clonazePAM (KLONOPIN) 1 MG disintegrating tablet Take 1 tablet by mouth as needed.      . desmopressin (DDAVP) 0.2 MG tablet Take 1 tablet by mouth Daily.      Marland Kitchen lisinopril (PRINIVIL,ZESTRIL) 10 MG tablet Take 1 capsule by mouth Daily.      . Multiple Vitamin (MULTIVITAMIN) capsule Take 1 capsule by mouth daily.        . Multiple Vitamins-Minerals (ICAPS PO) Take 1 tablet by mouth 2 (two) times daily.           Past Medical History  Diagnosis Date  . LEFT BUNDLE BRANCH BLOCK   . HYPERTENSION   . HYPERLIPIDEMIA   . CARDIOMYOPATHY   . LYMPHOMA   . DIVERTICULITIS, COLON     Past Surgical History  Procedure Date  . Appendectomy   . Toe surgery   . Lymphoma biopsies     History   Social History  . Marital Status: Widowed    Spouse Name: N/A    Number of Children: N/A  . Years of Education: N/A   Occupational History  . Not on file.   Social History Main Topics  . Smoking status: Former Games developer  . Smokeless tobacco: Not on file  . Alcohol Use: Yes  . Drug Use: Not on file  . Sexually Active: Not on file   Other Topics Concern  . Not on file   Social History Narrative  . No narrative on file    ROS: no fevers or chills, productive cough, hemoptysis, dysphasia, odynophagia, melena,  hematochezia, dysuria, hematuria, rash, seizure activity, orthopnea, PND, pedal edema, claudication. Remaining systems are negative.  Physical Exam: Well-developed well-nourished in no acute distress.  Skin is warm and dry.  HEENT is normal.  Neck is supple. No thyromegaly.  Chest is clear to auscultation with normal expansion.  Cardiovascular exam is regular rate and rhythm.  Abdominal exam nontender or distended. No masses palpated. Extremities show no edema. neuro grossly intact  ECG sinus rhythm at a rate of 77. Left anterior fasicular block. Cannot rule out prior septal infarct. Nonspecific T-wave changes.

## 2011-04-15 NOTE — Telephone Encounter (Signed)
Spoke with pt, questions answered Cindy Mccormick  

## 2011-04-15 NOTE — Assessment & Plan Note (Signed)
Management per primary care. 

## 2011-04-15 NOTE — Patient Instructions (Signed)
Your physician wants you to follow-up in: ONE YEAR You will receive a reminder letter in the mail two months in advance. If you don't receive a letter, please call our office to schedule the follow-up appointment.   Your physician has requested that you have an echocardiogram. Echocardiography is a painless test that uses sound waves to create images of your heart. It provides your doctor with information about the size and shape of your heart and how well your heart's chambers and valves are working. This procedure takes approximately one hour. There are no restrictions for this procedure.   START CARVEDILOL 3.125 MG ONCE DAILY

## 2011-04-15 NOTE — Telephone Encounter (Signed)
Pt was seen this am and has a question about her print out.  Please give her a call.

## 2011-04-22 ENCOUNTER — Ambulatory Visit (HOSPITAL_COMMUNITY): Payer: Medicare Other | Attending: Cardiology | Admitting: Radiology

## 2011-04-22 ENCOUNTER — Encounter: Payer: Self-pay | Admitting: *Deleted

## 2011-04-22 DIAGNOSIS — I10 Essential (primary) hypertension: Secondary | ICD-10-CM | POA: Insufficient documentation

## 2011-04-22 DIAGNOSIS — I428 Other cardiomyopathies: Secondary | ICD-10-CM

## 2011-04-22 DIAGNOSIS — E785 Hyperlipidemia, unspecified: Secondary | ICD-10-CM | POA: Insufficient documentation

## 2011-04-22 DIAGNOSIS — I080 Rheumatic disorders of both mitral and aortic valves: Secondary | ICD-10-CM | POA: Insufficient documentation

## 2011-04-27 ENCOUNTER — Telehealth: Payer: Self-pay | Admitting: Cardiology

## 2011-04-27 NOTE — Telephone Encounter (Signed)
Pt was given Dr Ludwig Clarks message about increasing the coreg and needing an ov.  No ov soon, will forward to Dr Ludwig Clarks nurse to make appt for pt.  Pt also is requesting results of echo.

## 2011-04-27 NOTE — Telephone Encounter (Signed)
Pt was calling about echo test results please call

## 2011-04-29 ENCOUNTER — Telehealth: Payer: Self-pay | Admitting: Cardiology

## 2011-04-29 NOTE — Telephone Encounter (Signed)
Please call pt about echo results

## 2011-04-30 ENCOUNTER — Telehealth: Payer: Self-pay | Admitting: *Deleted

## 2011-04-30 NOTE — Telephone Encounter (Signed)
Pt returning call to [facility] regarding ECHO results. Please call back.

## 2011-04-30 NOTE — Telephone Encounter (Signed)
Pt returning Cindy Mccormick's call, wanting results of echo--results given and f/u appointt made to see dr Jens Som for explanation--nt

## 2011-05-04 ENCOUNTER — Telehealth: Payer: Self-pay | Admitting: Cardiology

## 2011-05-04 DIAGNOSIS — I428 Other cardiomyopathies: Secondary | ICD-10-CM

## 2011-05-04 DIAGNOSIS — E785 Hyperlipidemia, unspecified: Secondary | ICD-10-CM

## 2011-05-04 DIAGNOSIS — I10 Essential (primary) hypertension: Secondary | ICD-10-CM

## 2011-05-04 MED ORDER — CARVEDILOL 3.125 MG PO TABS: 3.1250 mg | ORAL_TABLET | Freq: Two times a day (BID) | ORAL | Status: DC

## 2011-05-04 NOTE — Telephone Encounter (Signed)
Carvedilol refill  1 tab twice daily [facility] [place]

## 2011-05-08 ENCOUNTER — Telehealth: Payer: Self-pay | Admitting: Cardiology

## 2011-05-08 NOTE — Telephone Encounter (Signed)
Pt calling stating dr Jens Som wanted her to take coreg 6.125mg  BID and [facility] will not fill it as no one called them to change it(the note on ECHO result per dr Jens Som was to increase to 6.25 BID--her previous dose was 3.125-- advised pt i would take care of it and called [facility] and ordered coreg 6.25mg  BID--#60 may refillx10---nt

## 2011-05-08 NOTE — Telephone Encounter (Signed)
Pt is taking coreg 3.125 twice a day  She wants to make sure this correct Please call

## 2011-05-18 ENCOUNTER — Ambulatory Visit: Payer: Medicare Other | Admitting: Cardiology

## 2011-05-21 ENCOUNTER — Encounter: Payer: Self-pay | Admitting: Cardiology

## 2011-05-21 ENCOUNTER — Ambulatory Visit (INDEPENDENT_AMBULATORY_CARE_PROVIDER_SITE_OTHER): Payer: Medicare Other | Admitting: Cardiology

## 2011-05-21 DIAGNOSIS — E785 Hyperlipidemia, unspecified: Secondary | ICD-10-CM

## 2011-05-21 DIAGNOSIS — I10 Essential (primary) hypertension: Secondary | ICD-10-CM

## 2011-05-21 DIAGNOSIS — I428 Other cardiomyopathies: Secondary | ICD-10-CM

## 2011-05-21 MED ORDER — CARVEDILOL 12.5 MG PO TABS: 12.5000 mg | ORAL_TABLET | Freq: Two times a day (BID) | ORAL | Status: DC

## 2011-05-21 NOTE — Assessment & Plan Note (Signed)
Patient's LV function worse on recent echocardiogram. Etiology unclear. Check TSH and Myoview. She did receive chemotherapy in the past but I do not have the details. Certainly this could have contributed depending on which agent was used. Continue ACE inhibitor. Increase carvedilol to 12.5 mg p.o. B.i.d. Once her medications have been titrated plan repeat echocardiogram. If ejection fraction less than or equal to 35% we will discuss ICD.

## 2011-05-21 NOTE — Assessment & Plan Note (Signed)
Management per primary care. 

## 2011-05-21 NOTE — Assessment & Plan Note (Signed)
Blood pressure improved. Increase Coreg for cardiomyopathy.

## 2011-05-21 NOTE — Progress Notes (Signed)
HPI:Cindy Mccormick is a pleasant female with a history of nonischemic cardiomyopathy improved on fu echocardiogram. Previous Myoview in February 2003 showed a small nonreversible apical defect. No ischemia. Previous echocardiogram was performed in November of 2010. This showed an ejection fraction of 35-40%. However a followup MUGA revealed an ejection fraction of 50% with mild apical hypokinesis. Repeat echo in Oct of 2012 showed EF 25 to 30, mild LAE and trace AI and MR. I last saw her in October of 2012. Since then the patient denies any dyspnea on exertion, orthopnea, PND, pedal edema, palpitations, syncope or chest pain.    Current Outpatient Prescriptions  Medication Sig Dispense Refill  . carvedilol (COREG) 3.125 MG tablet Take 1 tablet (3.125 mg total) by mouth 2 (two) times daily.  60 tablet  11  . cephALEXin (KEFLEX) 250 MG capsule Take 1 tablet by mouth Daily.        Marland Kitchen desmopressin (DDAVP) 0.2 MG tablet Take 1 tablet by mouth Daily.      Marland Kitchen lisinopril (PRINIVIL,ZESTRIL) 10 MG tablet Take 1 capsule by mouth Daily.      . Multiple Vitamin (MULTIVITAMIN) capsule Take 1 capsule by mouth daily.        . Multiple Vitamins-Minerals (ICAPS PO) Take 1 tablet by mouth 2 (two) times daily.           Past Medical History  Diagnosis Date  . LEFT BUNDLE BRANCH BLOCK   . HYPERTENSION   . HYPERLIPIDEMIA   . CARDIOMYOPATHY   . LYMPHOMA   . DIVERTICULITIS, COLON   . Hemorrhoids, internal   . H/O: hysterectomy 1977    Past Surgical History  Procedure Date  . Appendectomy 1949  . Toe surgery   . Lymphoma biopsies     History   Social History  . Marital Status: Widowed    Spouse Name: N/A    Number of Children: N/A  . Years of Education: N/A   Occupational History  . retired Other   Social History Main Topics  . Smoking status: Former Games developer  . Smokeless tobacco: Not on file  . Alcohol Use: Yes     rarely  . Drug Use: Not on file  . Sexually Active: Not on file   Other Topics  Concern  . Not on file   Social History Narrative  . No narrative on file    ROS: no fevers or chills, productive cough, hemoptysis, dysphasia, odynophagia, melena, hematochezia, dysuria, hematuria, rash, seizure activity, orthopnea, PND, pedal edema, claudication. Remaining systems are negative.  Physical Exam: Well-developed well-nourished in no acute distress.  Skin is warm and dry.  HEENT is normal.  Neck is supple. No thyromegaly.  Chest is clear to auscultation with normal expansion.  Cardiovascular exam is regular rate and rhythm.  Abdominal exam nontender or distended. No masses palpated. Extremities show no edema. neuro grossly intact

## 2011-05-21 NOTE — Patient Instructions (Signed)
Your physician recommends that you schedule a follow-up appointment in: 8 WEEKS  Your physician recommends that you return for lab work in: TODAY  Your physician has requested that you have an adenosine myoview. For further information please visit [email or web address] Please follow instruction sheet, as given.  INCREASE CARVEDILOL 12.5 MGS TWICE DAILY

## 2011-05-25 ENCOUNTER — Other Ambulatory Visit: Payer: Self-pay | Admitting: Cardiology

## 2011-05-25 ENCOUNTER — Telehealth: Payer: Self-pay | Admitting: Cardiology

## 2011-05-25 NOTE — Telephone Encounter (Signed)
Pt rtn your call

## 2011-05-25 NOTE — Telephone Encounter (Signed)
New message:  Pt has some information to give you.

## 2011-05-25 NOTE — Telephone Encounter (Signed)
Spoke with pt, she called to let dr Jens Som know the chemo drugs she has taken. From dec 1996 through June 1997 she took the following, cytoxan, vincristine, adriamycin and prednisone. She is going to have the other medicine she took after 1997 faxed to [place]. Will forward for dr Jens Som review Cindy Mccormick

## 2011-05-25 NOTE — Telephone Encounter (Signed)
Ok  Cindy Mccormick  

## 2011-05-27 ENCOUNTER — Telehealth: Payer: Self-pay | Admitting: Cardiology

## 2011-05-27 NOTE — Telephone Encounter (Signed)
New Msg: Pt calling stating that medication pt last took when she had lymphoma is being faxed to our office from Dr. Cira Servant office. Please return pt call to discuss further if necessary.

## 2011-06-08 ENCOUNTER — Encounter (HOSPITAL_COMMUNITY): Payer: Medicare Other | Admitting: Radiology

## 2011-06-08 ENCOUNTER — Ambulatory Visit (HOSPITAL_COMMUNITY): Payer: Medicare Other | Attending: Cardiology | Admitting: Radiology

## 2011-06-08 DIAGNOSIS — I10 Essential (primary) hypertension: Secondary | ICD-10-CM | POA: Insufficient documentation

## 2011-06-08 DIAGNOSIS — Z8249 Family history of ischemic heart disease and other diseases of the circulatory system: Secondary | ICD-10-CM | POA: Insufficient documentation

## 2011-06-08 DIAGNOSIS — I739 Peripheral vascular disease, unspecified: Secondary | ICD-10-CM

## 2011-06-08 DIAGNOSIS — I447 Left bundle-branch block, unspecified: Secondary | ICD-10-CM | POA: Insufficient documentation

## 2011-06-08 DIAGNOSIS — E785 Hyperlipidemia, unspecified: Secondary | ICD-10-CM | POA: Insufficient documentation

## 2011-06-08 DIAGNOSIS — I428 Other cardiomyopathies: Secondary | ICD-10-CM | POA: Insufficient documentation

## 2011-06-08 DIAGNOSIS — Z87891 Personal history of nicotine dependence: Secondary | ICD-10-CM | POA: Insufficient documentation

## 2011-06-08 MED ORDER — TECHNETIUM TC 99M TETROFOSMIN IV KIT
33.0000 | PACK | Freq: Once | INTRAVENOUS | Status: AC | PRN
  Administered 2011-06-08: 33 via INTRAVENOUS

## 2011-06-08 MED ORDER — ADENOSINE (DIAGNOSTIC) 3 MG/ML IV SOLN
0.5600 mg/kg | Freq: Once | INTRAVENOUS | Status: AC
  Administered 2011-06-08: 32.1 mg via INTRAVENOUS

## 2011-06-08 MED ORDER — TECHNETIUM TC 99M TETROFOSMIN IV KIT
11.0000 | PACK | Freq: Once | INTRAVENOUS | Status: AC | PRN
  Administered 2011-06-08: 11 via INTRAVENOUS

## 2011-06-08 NOTE — Progress Notes (Signed)
[facility] SITE 3 NUCLEAR MED [address] [place] [address] [phone]  Cardiology Nuclear Med Study  Cindy Mccormick is a 77 y.o. female [id number] 01-01-1928   Nuclear Med Background Indication for Stress Test:  Evaluation for Ischemia History:  '81 Cath:OK per patient; '03 MPS:No ischemia, EF=56%; 10/12 Echo:EF=25-30%, mild LAE, trace AI, MR Cardiac Risk Factors: Family History - CAD, History of Smoking, Hypertension, LBBB and Lipids  Symptoms:  No symptoms.   Nuclear Pre-Procedure Caffeine/Decaff Intake:  None NPO After: 9:00pm   Lungs:  Clear.  O2 SAT 98% on RA. IV 0.9% NS with Angio Cath:  20g  IV Site: R Antecubital  IV Started by:  Stanton Kidney, EMT-P  Chest Size (in):  36 Cup Size: C  Height: 5\' 2"  (1.575 m)  Weight:  126 lb (57.153 kg)  BMI:  Body mass index is 23.05 kg/(m^2). Tech Comments:  Coreg Held this am, per patient.    Nuclear Med Study 1 or 2 day study: 1 day  Stress Test Type:  Adenosine  Reading MD: Olga Millers, MD  Order Authorizing Provider:  Olga Millers, MD  Resting Radionuclide: Technetium 58m Tetrofosmin  Resting Radionuclide Dose: 11.0 mCi   Stress Radionuclide:  Technetium 79m Tetrofosmin  Stress Radionuclide Dose: 33.0 mCi           Stress Protocol Rest HR: 66 Stress HR: 83  Rest BP: 149/76 Stress BP: 141/65  Exercise Time (min): n/a METS: n/a   Predicted Max HR: 139 bpm % Max HR: 59.71 bpm Rate Pressure Product: 41324   Dose of Adenosine (mg):  32.1 Dose of Lexiscan: n/a mg  Dose of Atropine (mg): n/a Dose of Dobutamine: n/a mcg/kg/min (at max HR)  Stress Test Technologist: Smiley Houseman, CMA-N  Nuclear Technologist:  Domenic Polite, CNMT     Rest Procedure:  Myocardial perfusion imaging was performed at rest 45 minutes following the intravenous administration of Technetium 85m Tetrofosmin.  Rest ECG: LBBB  Stress Procedure:  The patient received IV adenosine at 140 mcg/kg/min for 4 minutes.   There were no significant changes with infusion.  She did c/o chest tightness.  Technetium 65m Tetrofosmin was injected at the 2 minute mark and quantitative spect images were obtained after a 45 minute delay.  Stress ECG: Uninteretable due to baseline LBBB  QPS Raw Data Images:  Acquisition technically good; normal left ventricular size. Stress Images:  There is decreased uptake in the apex. Rest Images:  There is decreased uptake in the apex, less prominent compared to the stress images. Subtraction (SDS):  These findings are consistent with apical thinning and very mild apical ischemia. Transient Ischemic Dilatation (Normal <1.22):  1.10 Lung/Heart Ratio (Normal <0.45):  0.31  Quantitative Gated Spect Images QGS EDV:  94 ml QGS ESV:  52 ml QGS cine images:  Mild global hypokinesis. QGS EF: 45%  Impression Exercise Capacity:  Adenosine study with no exercise. BP Response:  Normal blood pressure response. Clinical Symptoms:  There is chest tightness\ ECG Impression:  Baseline:  LBBB.  EKG uninterpretable due to LBBB at rest and stress. Comparison with Prior Nuclear Study: Apical defect slightly more prominent compared to previous images.  Overall Impression:  Abnormal stress nuclear study with a small partially reversible apical defect suggestive of apical thinning and very mild apical ischemia.  Olga Millers

## 2011-06-11 ENCOUNTER — Telehealth: Payer: Self-pay | Admitting: Cardiology

## 2011-06-11 NOTE — Telephone Encounter (Signed)
New Msg: Pt calling wanting to speak to nurse regarding pt coreg. Pt is taking dose in the am and pm and pt wants to discuss the possibility of reducing that dose and/or frequency. Please return pt call to discuss further.

## 2011-06-11 NOTE — Telephone Encounter (Signed)
Pt states she is becoming tired out and sleepy from the Coreg.  It affects her in the am and the pm.  It has been bothering her since the last increase in doseage.  She wants to know if she can decrease the dose or only take it at night.

## 2011-06-11 NOTE — Telephone Encounter (Signed)
Continue present dose for another 2 weeks to see if tolerance improves; if not resume previous dose at that time Cindy Mccormick

## 2011-06-11 NOTE — Telephone Encounter (Signed)
Pt was notified.  

## 2011-06-23 ENCOUNTER — Telehealth: Payer: Self-pay | Admitting: Cardiology

## 2011-06-23 NOTE — Telephone Encounter (Signed)
New message:  Pt called and wanted to ask about Coreg and taking a lower dose.  Has an appt. 1-8.

## 2011-06-23 NOTE — Telephone Encounter (Signed)
Spoke with pt, she does feel some better on the current dosage of the carvedilol. She continue with the current until seen.

## 2011-06-26 NOTE — Telephone Encounter (Signed)
New problem:  C/o sided effect from medication coreg  - itching at night.

## 2011-06-26 NOTE — Telephone Encounter (Signed)
Pt states she continued the 12.5mg  bid of coreg but is still tired out and is now itching from the higher dose.  She states she did ok on the 6.25mg  bid but not the 12.5mg .  Please advise.

## 2011-06-26 NOTE — Telephone Encounter (Signed)
Resume previous dose of coreg Cindy Mccormick  

## 2011-07-01 NOTE — Telephone Encounter (Signed)
Left message for pt to take 6.25 mg of coreg bid

## 2011-07-14 ENCOUNTER — Encounter: Payer: Self-pay | Admitting: Cardiology

## 2011-07-14 ENCOUNTER — Ambulatory Visit (INDEPENDENT_AMBULATORY_CARE_PROVIDER_SITE_OTHER): Payer: Medicare Other | Admitting: Cardiology

## 2011-07-14 DIAGNOSIS — I10 Essential (primary) hypertension: Secondary | ICD-10-CM

## 2011-07-14 DIAGNOSIS — E785 Hyperlipidemia, unspecified: Secondary | ICD-10-CM

## 2011-07-14 DIAGNOSIS — I428 Other cardiomyopathies: Secondary | ICD-10-CM

## 2011-07-14 MED ORDER — CARVEDILOL 6.25 MG PO TABS: 6.2500 mg | ORAL_TABLET | Freq: Two times a day (BID) | ORAL | Status: DC

## 2011-07-14 NOTE — Assessment & Plan Note (Signed)
Blood pressure mildly elevated in office but controlled when she checks at home. Continue present medications.

## 2011-07-14 NOTE — Progress Notes (Signed)
   HPI:Cindy Mccormick is a pleasant female with a history of nonischemic cardiomyopathy improved on fu echocardiogram. Previous echocardiogram was performed in November of 2010. This showed an ejection fraction of 35-40%. However a followup MUGA revealed an ejection fraction of 50% with mild apical hypokinesis. Repeat echo in Oct of 2012 showed EF 25 to 30, mild LAE and trace AI and MR. Because of her worsening LV function we scheduled a Myoview which was performed in November of 2012 and showed an ejection fraction of 45%. There was a small partially reversible apical defect suggestive of thinning versus mild ischemia. TSH was normal. Since I last saw her, the patient denies any dyspnea on exertion, orthopnea, PND, pedal edema, palpitations, syncope or chest pain.    Current Outpatient Prescriptions  Medication Sig Dispense Refill  . carvedilol (COREG) 6.25 MG tablet Take 6.25 mg by mouth 2 (two) times daily with a meal.        . cephALEXin (KEFLEX) 250 MG capsule Take 1 tablet by mouth Daily.        Marland Kitchen desmopressin (DDAVP) 0.2 MG tablet Take 1 tablet by mouth Daily.      Marland Kitchen lisinopril (PRINIVIL,ZESTRIL) 10 MG tablet TAKE ONE (1) TABLET(S) ONCE DAILY  90 tablet  0  . Multiple Vitamin (MULTIVITAMIN) capsule Take 1 capsule by mouth daily.        . Multiple Vitamins-Minerals (ICAPS PO) Take 1 tablet by mouth 2 (two) times daily.           Past Medical History  Diagnosis Date  . LEFT BUNDLE BRANCH BLOCK   . HYPERTENSION   . HYPERLIPIDEMIA   . CARDIOMYOPATHY   . LYMPHOMA   . DIVERTICULITIS, COLON   . Hemorrhoids, internal   . H/O: hysterectomy 1977    Past Surgical History  Procedure Date  . Appendectomy 1949  . Toe surgery   . Lymphoma biopsies     History   Social History  . Marital Status: Widowed    Spouse Name: N/A    Number of Children: N/A  . Years of Education: N/A   Occupational History  . retired Other   Social History Main Topics  . Smoking status: Former Games developer  .  Smokeless tobacco: Not on file  . Alcohol Use: Yes     rarely  . Drug Use: Not on file  . Sexually Active: Not on file   Other Topics Concern  . Not on file   Social History Narrative  . No narrative on file    ROS: no fevers or chills, productive cough, hemoptysis, dysphasia, odynophagia, melena, hematochezia, dysuria, hematuria, rash, seizure activity, orthopnea, PND, pedal edema, claudication. Remaining systems are negative.  Physical Exam: Well-developed well-nourished in no acute distress.  Skin is warm and dry.  HEENT is normal.  Neck is supple. No thyromegaly.  Chest is clear to auscultation with normal expansion.  Cardiovascular exam is regular rate and rhythm.  Abdominal exam nontender or distended. No masses palpated. Extremities show no edema. neuro grossly intact

## 2011-07-14 NOTE — Assessment & Plan Note (Signed)
Management per primary care. 

## 2011-07-14 NOTE — Assessment & Plan Note (Signed)
Left ventricular function better on nuclear study and echocardiogram. Continue present dose of lisinopril and carvedilol. She did not tolerate higher doses of Coreg because of increased fatigue. Her blood pressure at home is running 110-120. Would not advance medications at this point.

## 2011-07-14 NOTE — Patient Instructions (Signed)
Your physician wants you to follow-up in:  6 months. You will receive a reminder letter in the mail two months in advance. If you don't receive a letter, please call our office to schedule the follow-up appointment.   

## 2011-07-21 ENCOUNTER — Telehealth: Payer: Self-pay | Admitting: Cardiology

## 2011-07-21 NOTE — Telephone Encounter (Signed)
Spoke with pt, pt questions answered.

## 2011-07-21 NOTE — Telephone Encounter (Signed)
New msg Patient said she had a lot of questions she would like to discuss with you

## 2011-08-28 ENCOUNTER — Other Ambulatory Visit: Payer: Self-pay | Admitting: Cardiology

## 2012-01-14 ENCOUNTER — Telehealth: Payer: Self-pay | Admitting: Cardiology

## 2012-01-14 ENCOUNTER — Ambulatory Visit (INDEPENDENT_AMBULATORY_CARE_PROVIDER_SITE_OTHER): Payer: Medicare Other | Admitting: Cardiology

## 2012-01-14 ENCOUNTER — Encounter: Payer: Self-pay | Admitting: Cardiology

## 2012-01-14 VITALS — BP 161/86 | HR 82 | Ht 62.0 in | Wt 124.0 lb

## 2012-01-14 DIAGNOSIS — I428 Other cardiomyopathies: Secondary | ICD-10-CM

## 2012-01-14 DIAGNOSIS — E785 Hyperlipidemia, unspecified: Secondary | ICD-10-CM

## 2012-01-14 DIAGNOSIS — I10 Essential (primary) hypertension: Secondary | ICD-10-CM

## 2012-01-14 MED ORDER — METOPROLOL SUCCINATE ER 25 MG PO TB24: 25.0000 mg | ORAL_TABLET | Freq: Every day | ORAL | Status: DC

## 2012-01-14 NOTE — Telephone Encounter (Signed)
Patient would like return call from Nurse DM regarding medication discussion, she can be reached at hm#

## 2012-01-14 NOTE — Progress Notes (Signed)
   HPI Ms. Kennard is a pleasant female with a history of nonischemic cardiomyopathy improved on fu echocardiogram. Previous echocardiogram was performed in November of 2010. This showed an ejection fraction of 35-40%. However a followup MUGA revealed an ejection fraction of 50% with mild apical hypokinesis. Repeat echo in Oct of 2012 showed EF 25 to 30, mild LAE and trace AI and MR. Because of her worsening LV function we scheduled a Myoview which was performed in November of 2012 and showed an ejection fraction of 45%. There was a small partially reversible apical defect suggestive of thinning versus mild ischemia. TSH was normal. Since I last saw her in Jan 2013, the patient denies any dyspnea on exertion, orthopnea, PND, pedal edema, palpitations, syncope or chest pain. She did stop her a.m. dose of carvedilol because of fatigue. :   Current Outpatient Prescriptions  Medication Sig Dispense Refill  . cephALEXin (KEFLEX) 250 MG capsule Take 1 tablet by mouth Daily.        Marland Kitchen desmopressin (DDAVP) 0.2 MG tablet Take 1 tablet by mouth Daily.      Marland Kitchen lisinopril (PRINIVIL,ZESTRIL) 10 MG tablet TAKE ONE (1) TABLET(S) ONCE DAILY  90 tablet  2  . Multiple Vitamin (MULTIVITAMIN) capsule Take 1 capsule by mouth daily.        . Multiple Vitamins-Minerals (ICAPS PO) Take 1 tablet by mouth 2 (two) times daily.        . metoprolol succinate (TOPROL XL) 25 MG 24 hr tablet Take 1 tablet (25 mg total) by mouth daily.  30 tablet  11     Past Medical History  Diagnosis Date  . LEFT BUNDLE BRANCH BLOCK   . HYPERTENSION   . HYPERLIPIDEMIA   . CARDIOMYOPATHY   . LYMPHOMA   . DIVERTICULITIS, COLON   . Hemorrhoids, internal   . H/O: hysterectomy 1977    Past Surgical History  Procedure Date  . Appendectomy 1949  . Toe surgery   . Lymphoma biopsies     History   Social History  . Marital Status: Widowed    Spouse Name: N/A    Number of Children: N/A  . Years of Education: N/A   Occupational History    . retired Other   Social History Main Topics  . Smoking status: Former Games developer  . Smokeless tobacco: Not on file  . Alcohol Use: Yes     rarely  . Drug Use: Not on file  . Sexually Active: Not on file   Other Topics Concern  . Not on file   Social History Narrative  . No narrative on file    ROS: some fatigue but no fevers or chills, productive cough, hemoptysis, dysphasia, odynophagia, melena, hematochezia, dysuria, hematuria, rash, seizure activity, orthopnea, PND, pedal edema, claudication. Remaining systems are negative.  Physical Exam: Well-developed well-nourished in no acute distress.  Skin is warm and dry.  HEENT is normal.  Neck is supple.  Chest is clear to auscultation with normal expansion.  Cardiovascular exam is regular rate and rhythm.  Abdominal exam nontender or distended. No masses palpated. Extremities show no edema. neuro grossly intact  ECG sinus rhythm at a rate of 82. Occasional PVCs. IVCD. Cannot rule out prior anterior infarct. Nonspecific ST changes.

## 2012-01-14 NOTE — Assessment & Plan Note (Signed)
Management per primary care. 

## 2012-01-14 NOTE — Telephone Encounter (Signed)
Spoke with pt, she had tried toprol in the past and was unable to tolerate due to itching. Will make dr Jens Som aware.

## 2012-01-14 NOTE — Telephone Encounter (Signed)
Dc toprol; change coreg to 3.125 mg po BID Cindy Mccormick

## 2012-01-14 NOTE — Assessment & Plan Note (Signed)
Blood pressure is elevated today but she has followed her pressure at home and it is well controlled. Continue present medications.

## 2012-01-14 NOTE — Telephone Encounter (Signed)
Spoke with pt, Aware of dr crenshaw's recommendations.  °

## 2012-01-14 NOTE — Telephone Encounter (Signed)
Error

## 2012-01-14 NOTE — Patient Instructions (Addendum)
Your physician wants you to follow-up in: ONE YEAR WITH DR Shelda Pal will receive a reminder letter in the mail two months in advance. If you don't receive a letter, please call our office to schedule the follow-up appointment.   STOP CARVEDILOL  START METOPROLOL SUCC 25 MG ONCE DAILY AT BEDTIME

## 2012-01-14 NOTE — Assessment & Plan Note (Addendum)
Patient is doing well from a symptomatic standpoint with no dyspnea. She did decrease her carvedilol to once every evening because of fatigue when she took the medication in the morning. I will discontinue carvedilol and begin Toprol 25 mg by mouth each bedtime. We will increase medications as tolerated. We'll plan to repeat echocardiogram when she returns in one year.

## 2012-01-28 ENCOUNTER — Telehealth: Payer: Self-pay | Admitting: Cardiology

## 2012-01-28 NOTE — Telephone Encounter (Signed)
New Problem:    Patient called in to let you know that her BP: 116/60 HR: 79.  Please call back if you have any questions.

## 2012-01-28 NOTE — Telephone Encounter (Signed)
Left message for pt, BP is great. She will call back with questions or problems

## 2012-05-10 ENCOUNTER — Other Ambulatory Visit: Payer: Self-pay | Admitting: Cardiology

## 2012-05-25 ENCOUNTER — Telehealth: Payer: Self-pay | Admitting: Cardiology

## 2012-05-25 NOTE — Telephone Encounter (Signed)
Spoke with pt, she is going to mail copy of PET scan she had done.

## 2012-05-25 NOTE — Telephone Encounter (Signed)
plz return call to pt [phone], regarding info on PET Scan

## 2012-06-04 ENCOUNTER — Other Ambulatory Visit: Payer: Self-pay | Admitting: Cardiology

## 2012-08-09 ENCOUNTER — Other Ambulatory Visit: Payer: Self-pay | Admitting: Cardiology

## 2012-08-12 ENCOUNTER — Emergency Department (HOSPITAL_BASED_OUTPATIENT_CLINIC_OR_DEPARTMENT_OTHER)
Admission: EM | Admit: 2012-08-12 | Discharge: 2012-08-12 | Disposition: A | Discharge status: Home / Self Care | Payer: Medicare Other | Attending: Emergency Medicine | Admitting: Emergency Medicine

## 2012-08-12 ENCOUNTER — Encounter (HOSPITAL_BASED_OUTPATIENT_CLINIC_OR_DEPARTMENT_OTHER): Payer: Self-pay

## 2012-08-12 DIAGNOSIS — I10 Essential (primary) hypertension: Secondary | ICD-10-CM | POA: Insufficient documentation

## 2012-08-12 DIAGNOSIS — Z87891 Personal history of nicotine dependence: Secondary | ICD-10-CM | POA: Insufficient documentation

## 2012-08-12 DIAGNOSIS — Z8679 Personal history of other diseases of the circulatory system: Secondary | ICD-10-CM | POA: Insufficient documentation

## 2012-08-12 DIAGNOSIS — Z79899 Other long term (current) drug therapy: Secondary | ICD-10-CM | POA: Insufficient documentation

## 2012-08-12 DIAGNOSIS — R197 Diarrhea, unspecified: Secondary | ICD-10-CM | POA: Insufficient documentation

## 2012-08-12 DIAGNOSIS — Z8719 Personal history of other diseases of the digestive system: Secondary | ICD-10-CM | POA: Insufficient documentation

## 2012-08-12 DIAGNOSIS — Z90710 Acquired absence of both cervix and uterus: Secondary | ICD-10-CM | POA: Insufficient documentation

## 2012-08-12 DIAGNOSIS — Z859 Personal history of malignant neoplasm, unspecified: Secondary | ICD-10-CM | POA: Insufficient documentation

## 2012-08-12 DIAGNOSIS — N390 Urinary tract infection, site not specified: Secondary | ICD-10-CM | POA: Insufficient documentation

## 2012-08-12 HISTORY — DX: Encounter for antineoplastic chemotherapy: Z51.11

## 2012-08-12 LAB — BASIC METABOLIC PANEL
BUN: 9 mg/dL (ref 6–23)
CO2: 26 mEq/L (ref 19–32)
Chloride: 100 mEq/L (ref 96–112)
Creatinine, Ser: 0.5 mg/dL (ref 0.50–1.10)
GFR calc Af Amer: >90 mL/min (ref >90)
Glucose, Bld: 98 mg/dL (ref 70–99)

## 2012-08-12 LAB — CBC WITH DIFFERENTIAL/PLATELET
Basophils Relative: 1 % (ref 0–1)
HCT: 37.5 % (ref 36.0–46.0)
Hemoglobin: 12.3 g/dL (ref 12.0–15.0)
Lymphs Abs: 0.7 10*3/uL (ref 0.7–4.0)
MCHC: 32.8 g/dL (ref 30.0–36.0)
Monocytes Absolute: 0.5 10*3/uL (ref 0.1–1.0)
Monocytes Relative: 9 % (ref 3–12)
Neutro Abs: 4.6 10*3/uL (ref 1.7–7.7)

## 2012-08-12 LAB — URINE MICROSCOPIC-ADD ON

## 2012-08-12 LAB — URINALYSIS, ROUTINE W REFLEX MICROSCOPIC
Bilirubin Urine: NEGATIVE
Glucose, UA: NEGATIVE mg/dL
Ketones, ur: 15 mg/dL — AB
pH: 6 (ref 5.0–8.0)

## 2012-08-12 MED ORDER — CEPHALEXIN 500 MG PO CAPS: 500.0000 mg | ORAL_CAPSULE | Freq: Three times a day (TID) | ORAL | Status: DC

## 2012-08-12 MED ORDER — CEPHALEXIN 250 MG PO CAPS
500.0000 mg | ORAL_CAPSULE | Freq: Once | ORAL | Status: AC
  Administered 2012-08-12: 500 mg via ORAL
  Filled 2012-08-12: qty 2

## 2012-08-12 NOTE — ED Notes (Signed)
Pt reports diarrhea since Sunday.  She took Imodium yesterday, received relief but awakened during the night with diarrhea.

## 2012-08-12 NOTE — ED Notes (Signed)
MD at bedside. 

## 2012-08-12 NOTE — ED Provider Notes (Signed)
History     CSN: [id number]  Arrival date & time 08/12/12  4098   First MD Initiated Contact with Patient 08/12/12 1046      Chief Complaint  Patient presents with  . Diarrhea    (Consider location/radiation/quality/duration/timing/severity/associated sxs/prior treatment) HPI Pt presents with c/o diarrhea.  Had multiple watery stools approx 5 days ago which resolved, then symptoms recurred last night.  Today she has had no further diarrhea.  No abdominal pain.  No vomiting.  No fever/chills.  States she has been drinking increased amounts of fluids to try to maintain hydration.  Denies dysuria, no urgency or frequency.  There are no other associated systemic symptoms, there are no other alleviating or modifying factors. No blood in stools.  No recent travel or specific sick contacts.   Past Medical History  Diagnosis Date  . LEFT BUNDLE BRANCH BLOCK   . HYPERTENSION   . HYPERLIPIDEMIA   . CARDIOMYOPATHY   . LYMPHOMA   . DIVERTICULITIS, COLON   . Hemorrhoids, internal   . H/O: hysterectomy 1977  . Maintenance chemotherapy     Past Surgical History  Procedure Date  . Appendectomy 1949  . Toe surgery   . Lymphoma biopsies     Family History  Problem Relation Age of Onset  . Lymphoma Mother     History  Substance Use Topics  . Smoking status: Former Games developer  . Smokeless tobacco: Not on file  . Alcohol Use: Yes     Comment: rarely    OB History    Grav Para Term Preterm Abortions TAB SAB Ect Mult Living                  Review of Systems ROS reviewed and all otherwise negative except for mentioned in HPI  Allergies  Ezetimibe; Hydrocodone-acetaminophen; Nitrofurantoin; Oxycodone hcl; and Sulfonamide derivatives  Home Medications   Current Outpatient Rx  Name  Route  Sig  Dispense  Refill  . CARVEDILOL 3.125 MG PO TABS      TAKE ONE TABLET BY MOUTH TWICE DAILY   180 tablet   0   . CEPHALEXIN 500 MG PO CAPS   Oral   Take 1 capsule (500 mg total) by  mouth 3 (three) times daily.   30 capsule   0   . DESMOPRESSIN ACETATE 0.2 MG PO TABS   Oral   Take 1 tablet by mouth Daily.         Marland Kitchen LISINOPRIL 10 MG PO TABS      TAKE ONE (1) TABLET(S) ONCE DAILY   90 tablet   0   . METOPROLOL SUCCINATE ER 25 MG PO TB24   Oral   Take 1 tablet (25 mg total) by mouth daily.   30 tablet   11   . MULTIVITAMINS PO CAPS   Oral   Take 1 capsule by mouth daily.           . ICAPS PO   Oral   Take 1 tablet by mouth 2 (two) times daily.             BP 131/66  Pulse 73  Temp 98.5 F (36.9 C) (Oral)  Resp 16  Ht 5\' 2"  (1.575 m)  Wt 122 lb (55.339 kg)  BMI 22.31 kg/m2  SpO2 96% Vitals reviewed Physical Exam Physical Examination: General appearance - alert, well appearing, and in no distress Mental status - alert, oriented to person, place, and time Eyes - no scleral icterus, no  conjunctival injection Mouth - mucous membranes moist, pharynx normal without lesions Chest - clear to auscultation, no wheezes, rales or rhonchi, symmetric air entry Heart - normal rate, regular rhythm, normal S1, S2, no murmurs, rubs, clicks or gallops Abdomen - soft, nontender, nondistended, no masses or organomegaly, nabs Extremities - peripheral pulses normal, no pedal edema, no clubbing or cyanosis Skin - normal coloration and turgor, no rashes  ED Course  Procedures (including critical care time)  Labs Reviewed  CBC WITH DIFFERENTIAL - Abnormal; Notable for the following:    Neutrophils Relative 78 (*)     Lymphocytes Relative 11 (*)     All other components within normal limits  BASIC METABOLIC PANEL - Abnormal; Notable for the following:    GFR calc non Af Amer 88 (*)     All other components within normal limits  URINALYSIS, ROUTINE W REFLEX MICROSCOPIC - Abnormal; Notable for the following:    Color, Urine AMBER (*)  BIOCHEMICALS MAY BE AFFECTED BY COLOR   APPearance TURBID (*)     Hgb urine dipstick LARGE (*)     Ketones, ur 15 (*)      Protein, ur >300 (*)     Leukocytes, UA SMALL (*)     All other components within normal limits  URINE MICROSCOPIC-ADD ON - Abnormal; Notable for the following:    Squamous Epithelial / LPF FEW (*)     Bacteria, UA MANY (*)     All other components within normal limits  URINE CULTURE   No results found.   1. Diarrhea   2. Urinary tract infection       MDM  Pt presenting with multiple episodes of watery stools.  Vitals and exam reassuring with patient appearing well hydrated and benign abdominal exam.  Urinalysis c/w UTI- will start on keflex, urine culture sent.  Doubt bacterial cause for diarrhea.  Discharged with strict return precautions.  Pt agreeable with plan.        Ethelda Chick, MD 08/12/12 [phone]

## 2012-08-13 LAB — URINE CULTURE

## 2012-09-12 ENCOUNTER — Other Ambulatory Visit: Payer: Self-pay | Admitting: Cardiology

## 2012-11-16 ENCOUNTER — Other Ambulatory Visit: Payer: Self-pay | Admitting: Cardiology

## 2012-11-20 ENCOUNTER — Other Ambulatory Visit: Payer: Self-pay | Admitting: Cardiology

## 2012-11-22 NOTE — Telephone Encounter (Signed)
I sent a refill for  lisinopril. Seems as if Carvedilol was stopped last office visit, the hospital note has it  on med list, before i send in refill will route this to Dr. Jens Som nurse for review to see if pt is suppose to be on this. Called pt several time no answer

## 2013-01-18 ENCOUNTER — Emergency Department (HOSPITAL_BASED_OUTPATIENT_CLINIC_OR_DEPARTMENT_OTHER): Payer: Medicare Other

## 2013-01-18 ENCOUNTER — Encounter (HOSPITAL_BASED_OUTPATIENT_CLINIC_OR_DEPARTMENT_OTHER): Payer: Self-pay

## 2013-01-18 ENCOUNTER — Emergency Department (HOSPITAL_BASED_OUTPATIENT_CLINIC_OR_DEPARTMENT_OTHER)
Admission: EM | Admit: 2013-01-18 | Discharge: 2013-01-18 | Disposition: A | Discharge status: Home / Self Care | Payer: Medicare Other | Attending: Emergency Medicine | Admitting: Emergency Medicine

## 2013-01-18 DIAGNOSIS — Z8719 Personal history of other diseases of the digestive system: Secondary | ICD-10-CM | POA: Insufficient documentation

## 2013-01-18 DIAGNOSIS — Z8679 Personal history of other diseases of the circulatory system: Secondary | ICD-10-CM | POA: Insufficient documentation

## 2013-01-18 DIAGNOSIS — Y9389 Activity, other specified: Secondary | ICD-10-CM | POA: Insufficient documentation

## 2013-01-18 DIAGNOSIS — Z5111 Encounter for antineoplastic chemotherapy: Secondary | ICD-10-CM | POA: Insufficient documentation

## 2013-01-18 DIAGNOSIS — Z8639 Personal history of other endocrine, nutritional and metabolic disease: Secondary | ICD-10-CM | POA: Insufficient documentation

## 2013-01-18 DIAGNOSIS — S82831A Other fracture of upper and lower end of right fibula, initial encounter for closed fracture: Secondary | ICD-10-CM

## 2013-01-18 DIAGNOSIS — S82899A Other fracture of unspecified lower leg, initial encounter for closed fracture: Secondary | ICD-10-CM | POA: Insufficient documentation

## 2013-01-18 DIAGNOSIS — I10 Essential (primary) hypertension: Secondary | ICD-10-CM | POA: Insufficient documentation

## 2013-01-18 DIAGNOSIS — Y929 Unspecified place or not applicable: Secondary | ICD-10-CM | POA: Insufficient documentation

## 2013-01-18 DIAGNOSIS — Z79899 Other long term (current) drug therapy: Secondary | ICD-10-CM | POA: Insufficient documentation

## 2013-01-18 DIAGNOSIS — Z862 Personal history of diseases of the blood and blood-forming organs and certain disorders involving the immune mechanism: Secondary | ICD-10-CM | POA: Insufficient documentation

## 2013-01-18 DIAGNOSIS — Z87898 Personal history of other specified conditions: Secondary | ICD-10-CM | POA: Insufficient documentation

## 2013-01-18 DIAGNOSIS — X500XXA Overexertion from strenuous movement or load, initial encounter: Secondary | ICD-10-CM | POA: Insufficient documentation

## 2013-01-18 DIAGNOSIS — Z87891 Personal history of nicotine dependence: Secondary | ICD-10-CM | POA: Insufficient documentation

## 2013-01-18 MED ORDER — TRAMADOL HCL 50 MG PO TABS: 50.0000 mg | ORAL_TABLET | Freq: Four times a day (QID) | ORAL | Status: DC | PRN

## 2013-01-18 NOTE — ED Notes (Signed)
Twisted right ankle approx 7pm

## 2013-01-18 NOTE — ED Provider Notes (Signed)
Medical screening examination/treatment/procedure(s) were performed by non-physician practitioner and as supervising physician I was immediately available for consultation/collaboration.  Zade Falkner R. Temia Debroux, MD 01/18/13 2344 

## 2013-01-18 NOTE — ED Provider Notes (Signed)
History    CSN: [id number] Arrival date & time 01/18/13  1940  First MD Initiated Contact with Patient 01/18/13 2026     Chief Complaint  Patient presents with  . Ankle Injury   (Consider location/radiation/quality/duration/timing/severity/associated sxs/prior Treatment) Patient is a 79 y.o. female presenting with ankle pain. The history is provided by the patient. No language interpreter was used.  Ankle Pain Location:  Ankle Time since incident:  2 hours Injury: yes   Ankle location:  R ankle Pain details:    Quality:  Aching   Severity:  Mild   Onset quality:  Sudden   Timing:  Constant   Progression:  Worsening Worsened by:  Nothing tried Ineffective treatments:  None tried Pt twisted her right ankle,  Pt has swelling and pain Past Medical History  Diagnosis Date  . LEFT BUNDLE BRANCH BLOCK   . HYPERTENSION   . HYPERLIPIDEMIA   . CARDIOMYOPATHY   . LYMPHOMA   . DIVERTICULITIS, COLON   . Hemorrhoids, internal   . H/O: hysterectomy 1977  . Maintenance chemotherapy    Past Surgical History  Procedure Laterality Date  . Appendectomy  1949  . Toe surgery    . Lymphoma biopsies     Family History  Problem Relation Age of Onset  . Lymphoma Mother    History  Substance Use Topics  . Smoking status: Former Games developer  . Smokeless tobacco: Not on file  . Alcohol Use: No   OB History   Grav Para Term Preterm Abortions TAB SAB Ect Mult Living                 Review of Systems  Musculoskeletal: Positive for myalgias and joint swelling.  All other systems reviewed and are negative.    Allergies  Ezetimibe; Hydrocodone-acetaminophen; Nitrofurantoin; Oxycodone hcl; and Sulfonamide derivatives  Home Medications   Current Outpatient Rx  Name  Route  Sig  Dispense  Refill  . carvedilol (COREG) 3.125 MG tablet      TAKE ONE TABLET BY MOUTH TWICE DAILY   180 tablet   0   . cephALEXin (KEFLEX) 500 MG capsule   Oral   Take 1 capsule (500 mg total) by mouth  3 (three) times daily.   30 capsule   0   . desmopressin (DDAVP) 0.2 MG tablet   Oral   Take 1 tablet by mouth Daily.         Marland Kitchen lisinopril (PRINIVIL,ZESTRIL) 10 MG tablet      TAKE ONE TABLET BY MOUTH ONE TIME DAILY   90 tablet   0   . EXPIRED: metoprolol succinate (TOPROL XL) 25 MG 24 hr tablet   Oral   Take 1 tablet (25 mg total) by mouth daily.   30 tablet   11   . Multiple Vitamin (MULTIVITAMIN) capsule   Oral   Take 1 capsule by mouth daily.           . Multiple Vitamins-Minerals (ICAPS PO)   Oral   Take 1 tablet by mouth 2 (two) times daily.            BP 170/81  Pulse 106  Temp(Src) 98.8 F (37.1 C) (Oral)  Resp 20  Ht 5\' 2"  (1.575 m)  Wt 116 lb (52.617 kg)  BMI 21.21 kg/m2  SpO2 98% Physical Exam  Nursing note and vitals reviewed. Constitutional: She is oriented to person, place, and time.  Musculoskeletal: She exhibits tenderness.  Swollen right lateral ankle,  From,  nv and ns intact  Neurological: She is alert and oriented to person, place, and time. She has normal reflexes.  Skin: Skin is warm.  Psychiatric: She has a normal mood and affect.    ED Course  Procedures (including critical care time) Labs Reviewed - No data to display No results found. 1. Traumatic closed nondisplaced fracture of distal fibula, right, initial encounter     MDM  Cam walker,  Ice, elevate,   Elson Areas, PA-C 01/18/13 2133

## 2013-01-23 ENCOUNTER — Ambulatory Visit (INDEPENDENT_AMBULATORY_CARE_PROVIDER_SITE_OTHER): Payer: Medicare Other | Admitting: Family Medicine

## 2013-01-23 ENCOUNTER — Encounter: Payer: Self-pay | Admitting: Family Medicine

## 2013-01-23 VITALS — BP 127/78 | HR 87 | Ht 62.0 in | Wt 116.0 lb

## 2013-01-23 DIAGNOSIS — M25571 Pain in right ankle and joints of right foot: Secondary | ICD-10-CM

## 2013-01-23 DIAGNOSIS — M25579 Pain in unspecified ankle and joints of unspecified foot: Secondary | ICD-10-CM

## 2013-01-23 NOTE — Patient Instructions (Addendum)
You have a small distal fibula fracture. Continue wearing the cam boot every time you're up and walking around. Ok to take off to ice, bathe, sleep, if just sitting on the couch. Ice 15 minutes at a time 3-4 times a day as needed for pain and swelling. Advil or tylenol as needed for pain. ACE wrap and/or elevation to help with swelling. Follow up with me in 2 weeks to repeat x-rays. Anticipate around 6 weeks from when you injured it for this to heal completely.

## 2013-01-24 ENCOUNTER — Encounter: Payer: Self-pay | Admitting: Family Medicine

## 2013-01-24 DIAGNOSIS — M25571 Pain in right ankle and joints of right foot: Secondary | ICD-10-CM | POA: Insufficient documentation

## 2013-01-24 NOTE — Assessment & Plan Note (Signed)
Right distal fibula fracture - at the level of the ankle joint but is nondisplaced.  Pain is fairly mild at this point.  Advised we continue with cam walker.  Icing, nsaids as needed.  Elevation and ace wrap.  Should do well with conservative treatment over 6-8 weeks.  These are at higher risk of nonunion than if it was below the ankle joint.  Follow up in 2 weeks to repeat eval and x-rays.

## 2013-01-24 NOTE — Progress Notes (Signed)
Patient ID: Cindy Mccormick, female   DOB: 05/14/1928, 79 y.o.   MRN: [id number]  PCP: Garth Schlatter, MD  Subjective:   HPI: Patient is a 79 y.o. female here for right ankle injury.  Patient reports on 7/16 she was sitting in a recliner - went to get up to answer phone on the porch and when she did she felt a pop lateral ankle. Difficulty bearing weight after this and she felt nauseated. + swelling and bruising. No prior history of ankle injuries or any other fractures. No history of osteoporosis - has been screened for this. Has tramadol but not taking anything for pain. Using cam walker.  Past Medical History  Diagnosis Date  . LEFT BUNDLE BRANCH BLOCK   . HYPERTENSION   . HYPERLIPIDEMIA   . CARDIOMYOPATHY   . LYMPHOMA   . DIVERTICULITIS, COLON   . Hemorrhoids, internal   . H/O: hysterectomy 1977  . Maintenance chemotherapy     Current Outpatient Prescriptions on File Prior to Visit  Medication Sig Dispense Refill  . carvedilol (COREG) 3.125 MG tablet TAKE ONE TABLET BY MOUTH TWICE DAILY  180 tablet  0  . desmopressin (DDAVP) 0.2 MG tablet Take 1 tablet by mouth Daily.      Marland Kitchen lisinopril (PRINIVIL,ZESTRIL) 10 MG tablet TAKE ONE TABLET BY MOUTH ONE TIME DAILY  90 tablet  0  . metoprolol succinate (TOPROL XL) 25 MG 24 hr tablet Take 1 tablet (25 mg total) by mouth daily.  30 tablet  11  . Multiple Vitamin (MULTIVITAMIN) capsule Take 1 capsule by mouth daily.        . Multiple Vitamins-Minerals (ICAPS PO) Take 1 tablet by mouth 2 (two) times daily.        . traMADol (ULTRAM) 50 MG tablet Take 1 tablet (50 mg total) by mouth every 6 (six) hours as needed for pain.  15 tablet  0   No current facility-administered medications on file prior to visit.    Past Surgical History  Procedure Laterality Date  . Appendectomy  1949  . Toe surgery    . Lymphoma biopsies      Allergies  Allergen Reactions  . Ezetimibe   . Hydrocodone-Acetaminophen   . Nitrofurantoin   .  Oxycodone Hcl   . Sulfonamide Derivatives     History   Social History  . Marital Status: Widowed    Spouse Name: N/A    Number of Children: N/A  . Years of Education: N/A   Occupational History  . retired Other   Social History Main Topics  . Smoking status: Former Games developer  . Smokeless tobacco: Not on file  . Alcohol Use: No  . Drug Use: Not on file  . Sexually Active: Not on file   Other Topics Concern  . Not on file   Social History Narrative  . No narrative on file    Family History  Problem Relation Age of Onset  . Lymphoma Mother   . Sudden death Father   . Hypertension Neg Hx   . Hyperlipidemia Neg Hx   . Heart attack Neg Hx   . Diabetes Neg Hx     BP 127/78  Pulse 87  Ht 5\' 2"  (1.575 m)  Wt 116 lb (52.617 kg)  BMI 21.21 kg/m2  Review of Systems: See HPI above.    Objective:  Physical Exam:  Gen: NAD  R foot/ankle: Mild swelling lateral ankle.  Bruising into heel.  No other deformity. FROM ankle.  TTP lateral malleolus.  No navicular, base 5th, medial malleolus, other foot/ankle TTP, fibular head tenderness. Thompsons test negative. NV intact distally.    Assessment & Plan:  1. Right distal fibula fracture - at the level of the ankle joint but is nondisplaced.  Pain is fairly mild at this point.  Advised we continue with cam walker.  Icing, nsaids as needed.  Elevation and ace wrap.  Should do well with conservative treatment over 6-8 weeks.  These are at higher risk of nonunion than if it was below the ankle joint.  Follow up in 2 weeks to repeat eval and x-rays.

## 2013-01-25 ENCOUNTER — Ambulatory Visit: Payer: Medicare Other | Admitting: Cardiology

## 2013-01-27 ENCOUNTER — Telehealth: Payer: Self-pay | Admitting: Family Medicine

## 2013-01-27 NOTE — Telephone Encounter (Signed)
See above

## 2013-02-06 ENCOUNTER — Ambulatory Visit (HOSPITAL_BASED_OUTPATIENT_CLINIC_OR_DEPARTMENT_OTHER)
Admission: RE | Admit: 2013-02-06 | Discharge: 2013-02-06 | Disposition: A | Discharge status: Home / Self Care | Payer: Medicare Other | Source: Ambulatory Visit | Attending: Family Medicine | Admitting: Family Medicine

## 2013-02-06 ENCOUNTER — Ambulatory Visit (INDEPENDENT_AMBULATORY_CARE_PROVIDER_SITE_OTHER): Payer: 59 | Admitting: Family Medicine

## 2013-02-06 ENCOUNTER — Encounter: Payer: Self-pay | Admitting: Family Medicine

## 2013-02-06 VITALS — BP 162/86 | HR 80 | Ht 62.0 in | Wt 115.0 lb

## 2013-02-06 DIAGNOSIS — M25571 Pain in right ankle and joints of right foot: Secondary | ICD-10-CM

## 2013-02-06 DIAGNOSIS — S8290XD Unspecified fracture of unspecified lower leg, subsequent encounter for closed fracture with routine healing: Secondary | ICD-10-CM | POA: Insufficient documentation

## 2013-02-06 DIAGNOSIS — M25579 Pain in unspecified ankle and joints of unspecified foot: Secondary | ICD-10-CM

## 2013-02-06 NOTE — Patient Instructions (Addendum)
You have a small distal fibula fracture that is healing well. Continue wearing the cam boot every time you're up and walking around - if you use a regular shoe at nighttime be very careful! Ok to take off to ice, bathe, sleep, if just sitting on the couch. Ice 15 minutes at a time 3-4 times a day as needed for pain and swelling. Advil or tylenol as needed for pain. ACE wrap and/or elevation to help with swelling. Follow up with me around 8/27.

## 2013-02-08 ENCOUNTER — Encounter: Payer: Self-pay | Admitting: Family Medicine

## 2013-02-08 NOTE — Progress Notes (Signed)
Patient ID: Cindy Mccormick, female   DOB: 02-12-1928, 79 y.o.   MRN: [id number]  PCP: Garth Schlatter, MD  Subjective:   HPI: Patient is a 79 y.o. female here for f/u right ankle injury.  7/21: Patient reports on 7/16 she was sitting in a recliner - went to get up to answer phone on the porch and when she did she felt a pop lateral ankle. Difficulty bearing weight after this and she felt nauseated. + swelling and bruising. No prior history of ankle injuries or any other fractures. No history of osteoporosis - has been screened for this. Has tramadol but not taking anything for pain. Using cam walker.  8/4: Patient reports ankle feels much better than last visit. Pain about a 1/10. Using cam walker regularly. Not needing to ice or use meds regularly. Elevating foot a lot. Has been having lightheadedness, vertigo - not taking any meds in association with this. Has not discussed with her PCP or [facility] she [facility] for this.  Past Medical History  Diagnosis Date  . LEFT BUNDLE BRANCH BLOCK   . HYPERTENSION   . HYPERLIPIDEMIA   . CARDIOMYOPATHY   . LYMPHOMA   . DIVERTICULITIS, COLON   . Hemorrhoids, internal   . H/O: hysterectomy 1977  . Maintenance chemotherapy     Current Outpatient Prescriptions on File Prior to Visit  Medication Sig Dispense Refill  . carvedilol (COREG) 3.125 MG tablet TAKE ONE TABLET BY MOUTH TWICE DAILY  180 tablet  0  . desmopressin (DDAVP) 0.2 MG tablet Take 1 tablet by mouth Daily.      Marland Kitchen lisinopril (PRINIVIL,ZESTRIL) 10 MG tablet TAKE ONE TABLET BY MOUTH ONE TIME DAILY  90 tablet  0  . metoprolol succinate (TOPROL XL) 25 MG 24 hr tablet Take 1 tablet (25 mg total) by mouth daily.  30 tablet  11  . Multiple Vitamin (MULTIVITAMIN) capsule Take 1 capsule by mouth daily.        . Multiple Vitamins-Minerals (ICAPS PO) Take 1 tablet by mouth 2 (two) times daily.        . traMADol (ULTRAM) 50 MG tablet Take 1 tablet (50 mg  total) by mouth every 6 (six) hours as needed for pain.  15 tablet  0   No current facility-administered medications on file prior to visit.    Past Surgical History  Procedure Laterality Date  . Appendectomy  1949  . Toe surgery    . Lymphoma biopsies      Allergies  Allergen Reactions  . Ezetimibe   . Hydrocodone-Acetaminophen   . Nitrofurantoin   . Oxycodone Hcl   . Sulfonamide Derivatives     History   Social History  . Marital Status: Widowed    Spouse Name: N/A    Number of Children: N/A  . Years of Education: N/A   Occupational History  . retired Other   Social History Main Topics  . Smoking status: Former Games developer  . Smokeless tobacco: Not on file  . Alcohol Use: No  . Drug Use: Not on file  . Sexually Active: Not on file   Other Topics Concern  . Not on file   Social History Narrative  . No narrative on file    Family History  Problem Relation Age of Onset  . Lymphoma Mother   . Sudden death Father   . Hypertension Neg Hx   . Hyperlipidemia Neg Hx   . Heart attack Neg Hx   .  Diabetes Neg Hx     BP 162/86  Pulse 80  Ht 5\' 2"  (1.575 m)  Wt 115 lb (52.164 kg)  BMI 21.03 kg/m2  Review of Systems: See HPI above.    Objective:  Physical Exam:  Gen: NAD  R foot/ankle: Mild swelling lateral ankle.  Bruising improved.  No other deformity. FROM ankle. Minimal TTP lateral malleolus.  No navicular, base 5th, medial malleolus, other foot/ankle TTP, fibular head tenderness. Thompsons test negative. NV intact distally.    Assessment & Plan:  1. Right distal fibula fracture - repeat radiographs show no additional displacement.  Clinically healing well.  Continue using Cam walker.  Advil or tylenol as needed.  Icing as needed.  Elevate, ace wrap.  F/u at 6 weeks out for reevaluation.  Advised to follow-up with PCP or the [facility] she's been to previously for dizziness/vertigo.

## 2013-02-08 NOTE — Assessment & Plan Note (Signed)
Right distal fibula fracture - repeat radiographs show no additional displacement.  Clinically healing well.  Continue using Cam walker.  Advil or tylenol as needed.  Icing as needed.  Elevate, ace wrap.  F/u at 6 weeks out for reevaluation.

## 2013-02-28 ENCOUNTER — Other Ambulatory Visit: Payer: Self-pay | Admitting: Cardiology

## 2013-03-01 ENCOUNTER — Encounter: Payer: Self-pay | Admitting: Family Medicine

## 2013-03-01 ENCOUNTER — Ambulatory Visit (INDEPENDENT_AMBULATORY_CARE_PROVIDER_SITE_OTHER): Payer: 59 | Admitting: Family Medicine

## 2013-03-01 VITALS — BP 138/83 | HR 74 | Ht 62.0 in | Wt 115.0 lb

## 2013-03-01 DIAGNOSIS — M25571 Pain in right ankle and joints of right foot: Secondary | ICD-10-CM

## 2013-03-01 DIAGNOSIS — M25579 Pain in unspecified ankle and joints of unspecified foot: Secondary | ICD-10-CM

## 2013-03-02 ENCOUNTER — Telehealth: Payer: Self-pay | Admitting: Cardiology

## 2013-03-02 NOTE — Telephone Encounter (Signed)
Left message for pt to call.

## 2013-03-02 NOTE — Telephone Encounter (Signed)
New Problem  Pt returning call for medication

## 2013-03-02 NOTE — Telephone Encounter (Signed)
Pt requested a refill of carvedilol, pharmacy told her we told them not to refill it and she wonders why, pls call,  up until 1100am, or after 300pm, or cell [phone]

## 2013-03-03 ENCOUNTER — Encounter: Payer: Self-pay | Admitting: Family Medicine

## 2013-03-03 MED ORDER — CARVEDILOL 3.125 MG PO TABS: 3.1250 mg | ORAL_TABLET | Freq: Two times a day (BID) | ORAL | Status: DC

## 2013-03-03 NOTE — Assessment & Plan Note (Signed)
Right distal fibula fracture - Clinically healed at this point.  Discontinue cam walker.  Advil or tylenol if needed.  F/u prn.

## 2013-03-03 NOTE — Telephone Encounter (Signed)
F/up   Returning call about medications.

## 2013-03-03 NOTE — Telephone Encounter (Signed)
Spoke with pt, explained to pt that according to the last office note she was to stop the carvedilol and change to metoprolol. The pt reports she never got the metoprolol and continues to take carvedilol. She has a follow up appt next month. Script for carvedilol sent to pharm.

## 2013-03-03 NOTE — Progress Notes (Signed)
Patient ID: Cindy Mccormick, female   DOB: [birth year], 79 y.o.   MRN: [id number]  PCP: Garth Schlatter, MD  Subjective:   HPI: Patient is a 79 y.o. female here for f/u right ankle injury.  7/21: Patient reports on 7/16 she was sitting in a recliner - went to get up to answer phone on the porch and when she did she felt a pop lateral ankle. Difficulty bearing weight after this and she felt nauseated. + swelling and bruising. No prior history of ankle injuries or any other fractures. No history of osteoporosis - has been screened for this. Has tramadol but not taking anything for pain. Using cam walker.  8/4: Patient reports ankle feels much better than last visit. Pain about a 1/10. Using cam walker regularly. Not needing to ice or use meds regularly. Elevating foot a lot. Has been having lightheadedness, vertigo - not taking any meds in association with this. Has not discussed with her PCP or the [facility] where she [facility] for this.  8/28: Patient states she is doing well. No complaints of pain. Dizziness has gone away. Not using boot to sleep and no pain then. Able to walk around house a little without boot and no pain.  Past Medical History  Diagnosis Date  . LEFT BUNDLE BRANCH BLOCK   . HYPERTENSION   . HYPERLIPIDEMIA   . CARDIOMYOPATHY   . LYMPHOMA   . DIVERTICULITIS, COLON   . Hemorrhoids, internal   . H/O: hysterectomy 1977  . Maintenance chemotherapy     Current Outpatient Prescriptions on File Prior to Visit  Medication Sig Dispense Refill  . carvedilol (COREG) 3.125 MG tablet TAKE ONE TABLET BY MOUTH TWICE DAILY  180 tablet  0  . desmopressin (DDAVP) 0.2 MG tablet Take 1 tablet by mouth Daily.      Marland Kitchen lisinopril (PRINIVIL,ZESTRIL) 10 MG tablet TAKE ONE TABLET BY MOUTH ONE TIME DAILY  90 tablet  0  . metoprolol succinate (TOPROL XL) 25 MG 24 hr tablet Take 1 tablet (25 mg total) by mouth daily.  30 tablet  11  . Multiple Vitamin  (MULTIVITAMIN) capsule Take 1 capsule by mouth daily.        . Multiple Vitamins-Minerals (ICAPS PO) Take 1 tablet by mouth 2 (two) times daily.         No current facility-administered medications on file prior to visit.    Past Surgical History  Procedure Laterality Date  . Appendectomy  1949  . Toe surgery    . Lymphoma biopsies      Allergies  Allergen Reactions  . Ezetimibe   . Hydrocodone-Acetaminophen   . Nitrofurantoin   . Oxycodone Hcl   . Sulfonamide Derivatives     History   Social History  . Marital Status: Widowed    Spouse Name: N/A    Number of Children: N/A  . Years of Education: N/A   Occupational History  . retired Other   Social History Main Topics  . Smoking status: Former Games developer  . Smokeless tobacco: Not on file  . Alcohol Use: No  . Drug Use: Not on file  . Sexual Activity: Not on file   Other Topics Concern  . Not on file   Social History Narrative  . No narrative on file    Family History  Problem Relation Age of Onset  . Lymphoma Mother   . Sudden death Father   . Hypertension Neg Hx   . Hyperlipidemia Neg Hx   .  Heart attack Neg Hx   . Diabetes Neg Hx     BP 138/83  Pulse 74  Ht 5\' 2"  (1.575 m)  Wt 115 lb (52.164 kg)  BMI 21.03 kg/m2  Review of Systems: See HPI above.    Objective:  Physical Exam:  Gen: NAD  R foot/ankle: Mild swelling lateral ankle.  Bruising resolved.  No other deformity. FROM ankle. No TTP lateral malleolus.  No navicular, base 5th, medial malleolus, other foot/ankle TTP, fibular head tenderness. Thompsons test negative. NV intact distally.    Assessment & Plan:  1. Right distal fibula fracture - Clinically healed at this point.  Discontinue cam walker.  Advil or tylenol if needed.  F/u prn.

## 2013-03-23 ENCOUNTER — Encounter: Payer: Self-pay | Admitting: Cardiology

## 2013-03-23 ENCOUNTER — Ambulatory Visit (INDEPENDENT_AMBULATORY_CARE_PROVIDER_SITE_OTHER): Payer: 59 | Admitting: Cardiology

## 2013-03-23 VITALS — BP 124/68 | HR 68 | Ht 62.0 in | Wt 118.8 lb

## 2013-03-23 DIAGNOSIS — I10 Essential (primary) hypertension: Secondary | ICD-10-CM

## 2013-03-23 DIAGNOSIS — I428 Other cardiomyopathies: Secondary | ICD-10-CM

## 2013-03-23 MED ORDER — ASPIRIN EC 81 MG PO TBEC: 81.0000 mg | DELAYED_RELEASE_TABLET | Freq: Every day | ORAL | Status: DC

## 2013-03-23 NOTE — Progress Notes (Signed)
   HPI: Ms. Comp is a pleasant female with a history of nonischemic cardiomyopathy improved on fu echocardiogram. Previous echocardiogram was performed in November of 2010. This showed an ejection fraction of 35-40%. However a followup MUGA revealed an ejection fraction of 50% with mild apical hypokinesis. Repeat echo in Oct of 2012 showed EF 25 to 30, mild LAE and trace AI and MR. Because of her worsening LV function we scheduled a Myoview which was performed in November of 2012 and showed an ejection fraction of 45%. There was a small partially reversible apical defect suggestive of thinning versus mild ischemia. TSH was normal. Since I last saw her in July 2013, she denies dyspnea, chest pain or syncope.   Current Outpatient Prescriptions  Medication Sig Dispense Refill  . carvedilol (COREG) 3.125 MG tablet Take 1 tablet (3.125 mg total) by mouth 2 (two) times daily with a meal.  180 tablet  4  . desmopressin (DDAVP) 0.2 MG tablet Take 1 tablet by mouth Daily.      Marland Kitchen lisinopril (PRINIVIL,ZESTRIL) 10 MG tablet TAKE ONE TABLET BY MOUTH ONE TIME DAILY  90 tablet  0  . Multiple Vitamin (MULTIVITAMIN) capsule Take 1 capsule by mouth daily.         No current facility-administered medications for this visit.     Past Medical History  Diagnosis Date  . LEFT BUNDLE BRANCH BLOCK   . HYPERTENSION   . HYPERLIPIDEMIA   . CARDIOMYOPATHY   . LYMPHOMA   . DIVERTICULITIS, COLON   . Hemorrhoids, internal   . H/O: hysterectomy 1977  . Maintenance chemotherapy     Past Surgical History  Procedure Laterality Date  . Appendectomy  1949  . Toe surgery    . Lymphoma biopsies      History   Social History  . Marital Status: Widowed    Spouse Name: N/A    Number of Children: N/A  . Years of Education: N/A   Occupational History  . retired Other   Social History Main Topics  . Smoking status: Former Games developer  . Smokeless tobacco: Not on file  . Alcohol Use: No  . Drug Use: Not on file  .  Sexual Activity: Not on file   Other Topics Concern  . Not on file   Social History Narrative  . No narrative on file    ROS: no fevers or chills, productive cough, hemoptysis, dysphasia, odynophagia, melena, hematochezia, dysuria, hematuria, rash, seizure activity, orthopnea, PND, pedal edema, claudication. Remaining systems are negative.  Physical Exam: Well-developed well-nourished in no acute distress.  Skin is warm and dry.  HEENT is normal.  Neck is supple.  Chest is clear to auscultation with normal expansion.  Cardiovascular exam is regular rate and rhythm.  Abdominal exam nontender or distended. No masses palpated. Extremities show no edema. neuro grossly intact  ECG sinus rhythm, left bundle branch block. Occasional PVC.

## 2013-03-23 NOTE — Patient Instructions (Addendum)
Your physician wants you to follow-up in: ONE YEAR WITH DR Shelda Pal will receive a reminder letter in the mail two months in advance. If you don't receive a letter, please call our office to schedule the follow-up appointment.   START ASPIRIN 81 MG ONCE DAILY  Your physician has requested that you have an echocardiogram. Echocardiography is a painless test that uses sound waves to create images of your heart. It provides your doctor with information about the size and shape of your heart and how well your heart's chambers and valves are working. This procedure takes approximately one hour. There are no restrictions for this procedure.

## 2013-03-23 NOTE — Assessment & Plan Note (Signed)
Blood pressure controlled.continue present medications. 

## 2013-03-23 NOTE — Assessment & Plan Note (Signed)
Continue ACE inhibitor and beta blocker. We discussed increasing her Coreg but previous attempts at this caused hypotension and dizziness. I have added enteric-coated aspirin 81 mg daily given previous mildly abnormal nuclear study. Repeat echocardiogram.

## 2013-03-28 ENCOUNTER — Ambulatory Visit (HOSPITAL_COMMUNITY): Payer: Medicare Other | Attending: Cardiology | Admitting: Radiology

## 2013-03-28 DIAGNOSIS — I10 Essential (primary) hypertension: Secondary | ICD-10-CM | POA: Insufficient documentation

## 2013-03-28 DIAGNOSIS — I447 Left bundle-branch block, unspecified: Secondary | ICD-10-CM | POA: Diagnosis not present

## 2013-03-28 DIAGNOSIS — Z87891 Personal history of nicotine dependence: Secondary | ICD-10-CM | POA: Diagnosis not present

## 2013-03-28 DIAGNOSIS — C8589 Other specified types of non-Hodgkin lymphoma, extranodal and solid organ sites: Secondary | ICD-10-CM | POA: Diagnosis not present

## 2013-03-28 DIAGNOSIS — E119 Type 2 diabetes mellitus without complications: Secondary | ICD-10-CM | POA: Diagnosis not present

## 2013-03-28 DIAGNOSIS — Z9221 Personal history of antineoplastic chemotherapy: Secondary | ICD-10-CM | POA: Diagnosis not present

## 2013-03-28 DIAGNOSIS — I428 Other cardiomyopathies: Secondary | ICD-10-CM | POA: Insufficient documentation

## 2013-03-28 DIAGNOSIS — I080 Rheumatic disorders of both mitral and aortic valves: Secondary | ICD-10-CM | POA: Insufficient documentation

## 2013-03-28 NOTE — Progress Notes (Signed)
Echocardiogram performed.  

## 2013-04-17 ENCOUNTER — Other Ambulatory Visit: Payer: Self-pay | Admitting: Cardiology

## 2013-06-07 ENCOUNTER — Ambulatory Visit (INDEPENDENT_AMBULATORY_CARE_PROVIDER_SITE_OTHER): Payer: 59 | Admitting: Family Medicine

## 2013-06-07 VITALS — BP 136/83 | HR 91 | Ht 62.0 in | Wt 119.0 lb

## 2013-06-07 DIAGNOSIS — S39012A Strain of muscle, fascia and tendon of lower back, initial encounter: Secondary | ICD-10-CM

## 2013-06-07 DIAGNOSIS — M545 Low back pain, unspecified: Secondary | ICD-10-CM

## 2013-06-07 DIAGNOSIS — S335XXA Sprain of ligaments of lumbar spine, initial encounter: Secondary | ICD-10-CM

## 2013-06-07 NOTE — Patient Instructions (Signed)
You have a lumbar strain. Start physical therapy for stretches, exercises, modalities. Do home exercises as directed by them on days you don't go to therapy. Activities as tolerated otherwise. Take tylenol for baseline pain relief (1-2 extra strength tabs 3x/day) If needed can take aleve as well. Strengthening of low back muscles, abdominal musculature are key for long term pain relief. Follow up with me in 6 weeks or as needed.

## 2013-06-08 ENCOUNTER — Encounter: Payer: Self-pay | Admitting: Family Medicine

## 2013-06-08 DIAGNOSIS — M545 Low back pain, unspecified: Secondary | ICD-10-CM | POA: Insufficient documentation

## 2013-06-08 NOTE — Progress Notes (Signed)
Patient ID: Cindy Mccormick, female   DOB: [birth year], 79 y.o.   MRN: [id number]  PCP: Garth Schlatter, MD  Subjective:   HPI: Patient is a 79 y.o. female here for low back pain.  Patient returns for right low back pain that has been mild since August 27th. Seemed to come on and worsen by wearing boot on the right side (see previous visits). Some radiation into groin at times. No numbness/tingling. No bowel/bladder dysfunction. Tried heat, advil. No new injuries. No fevers, chills, sweats, other complaints.  Past Medical History  Diagnosis Date  . LEFT BUNDLE BRANCH BLOCK   . HYPERTENSION   . HYPERLIPIDEMIA   . CARDIOMYOPATHY   . LYMPHOMA   . DIVERTICULITIS, COLON   . Hemorrhoids, internal   . H/O: hysterectomy 1977  . Maintenance chemotherapy     Current Outpatient Prescriptions on File Prior to Visit  Medication Sig Dispense Refill  . aspirin EC 81 MG tablet Take 1 tablet (81 mg total) by mouth daily.  90 tablet  3  . carvedilol (COREG) 3.125 MG tablet Take 1 tablet (3.125 mg total) by mouth 2 (two) times daily with a meal.  180 tablet  4  . desmopressin (DDAVP) 0.2 MG tablet Take 1 tablet by mouth Daily.      Marland Kitchen lisinopril (PRINIVIL,ZESTRIL) 10 MG tablet TAKE ONE TABLET BY MOUTH ONE TIME DAILY  90 tablet  3  . Multiple Vitamin (MULTIVITAMIN) capsule Take 1 capsule by mouth daily.         No current facility-administered medications on file prior to visit.    Past Surgical History  Procedure Laterality Date  . Appendectomy  1949  . Toe surgery    . Lymphoma biopsies      Allergies  Allergen Reactions  . Ezetimibe   . Hydrocodone-Acetaminophen   . Nitrofurantoin   . Oxycodone Hcl   . Sulfonamide Derivatives     History   Social History  . Marital Status: Widowed    Spouse Name: N/A    Number of Children: N/A  . Years of Education: N/A   Occupational History  . retired Other   Social History Main Topics  . Smoking status: Former Games developer  .  Smokeless tobacco: Not on file  . Alcohol Use: No  . Drug Use: Not on file  . Sexual Activity: Not on file   Other Topics Concern  . Not on file   Social History Narrative  . No narrative on file    Family History  Problem Relation Age of Onset  . Lymphoma Mother   . Sudden death Father   . Hypertension Neg Hx   . Hyperlipidemia Neg Hx   . Heart attack Neg Hx   . Diabetes Neg Hx     BP 136/83  Pulse 91  Ht 5\' 2"  (1.575 m)  Wt 119 lb (53.978 kg)  BMI 21.76 kg/m2  Review of Systems: See HPI above.    Objective:  Physical Exam:  Gen: NAD  Back: No gross deformity, scoliosis. No midline TTP.  Mild right paraspinal lumbar tenderness just to right if spine. FROM without pain currently. Strength LEs 5/5 all muscle groups.   1+ MSRs in patellar and achilles tendons, equal bilaterally. Negative SLRs. Sensation intact to light touch bilaterally. Negative logroll bilateral hips Negative fabers and piriformis stretches.    Assessment & Plan:  1. Low back pain - no focal bony tenderness and no red flags.  Consistent with strain and likely  related to wearing cam walker for several weeks.  Start physical therapy and home exercise program.  Tylenol, aleve as needed.  F/u in 6 weeks or prn.  Consider radiographs if not improving.

## 2013-06-08 NOTE — Assessment & Plan Note (Signed)
no focal bony tenderness and no red flags.  Consistent with strain and likely related to wearing cam walker for several weeks.  Start physical therapy and home exercise program.  Tylenol, aleve as needed.  F/u in 6 weeks or prn.  Consider radiographs if not improving.

## 2013-06-14 ENCOUNTER — Ambulatory Visit: Payer: Medicare Other | Attending: Family Medicine | Admitting: Physical Therapy

## 2013-06-14 DIAGNOSIS — M25559 Pain in unspecified hip: Secondary | ICD-10-CM | POA: Insufficient documentation

## 2013-06-14 DIAGNOSIS — IMO0001 Reserved for inherently not codable concepts without codable children: Secondary | ICD-10-CM | POA: Insufficient documentation

## 2013-06-21 ENCOUNTER — Ambulatory Visit: Payer: Medicare Other | Admitting: Physical Therapy

## 2013-06-27 ENCOUNTER — Ambulatory Visit: Payer: Medicare Other | Admitting: Physical Therapy

## 2013-06-27 ENCOUNTER — Telehealth: Payer: Self-pay | Admitting: Family Medicine

## 2013-06-27 NOTE — Telephone Encounter (Signed)
I would recommend she go to one more physical therapy visit to get a full home exercise program and do that daily in addition to her silver sneakers program.  Thanks!

## 2013-07-03 ENCOUNTER — Ambulatory Visit: Payer: Medicare Other | Admitting: Physical Therapy

## 2013-07-07 ENCOUNTER — Ambulatory Visit: Payer: Medicare Other | Attending: Family Medicine | Admitting: Physical Therapy

## 2013-07-07 DIAGNOSIS — IMO0001 Reserved for inherently not codable concepts without codable children: Secondary | ICD-10-CM | POA: Insufficient documentation

## 2013-07-07 DIAGNOSIS — M25559 Pain in unspecified hip: Secondary | ICD-10-CM | POA: Insufficient documentation

## 2013-07-11 ENCOUNTER — Ambulatory Visit: Payer: Medicare Other | Admitting: Physical Therapy

## 2013-07-18 ENCOUNTER — Ambulatory Visit: Payer: Medicare Other | Admitting: Physical Therapy

## 2013-07-25 ENCOUNTER — Ambulatory Visit: Payer: Medicare Other | Admitting: Physical Therapy

## 2013-08-01 ENCOUNTER — Ambulatory Visit: Payer: Medicare Other | Admitting: Physical Therapy

## 2013-08-08 ENCOUNTER — Ambulatory Visit: Payer: 59 | Attending: Family Medicine | Admitting: Physical Therapy

## 2013-08-08 DIAGNOSIS — M25559 Pain in unspecified hip: Secondary | ICD-10-CM | POA: Insufficient documentation

## 2013-08-08 DIAGNOSIS — IMO0001 Reserved for inherently not codable concepts without codable children: Secondary | ICD-10-CM | POA: Insufficient documentation

## 2013-08-15 ENCOUNTER — Ambulatory Visit: Payer: 59 | Admitting: Physical Therapy

## 2013-11-07 ENCOUNTER — Emergency Department (HOSPITAL_COMMUNITY): Payer: Medicare Other

## 2013-11-07 ENCOUNTER — Encounter (HOSPITAL_COMMUNITY): Payer: Self-pay | Admitting: Radiology

## 2013-11-07 ENCOUNTER — Inpatient Hospital Stay (HOSPITAL_COMMUNITY)
Admission: EM | Admit: 2013-11-07 | Discharge: 2013-11-17 | DRG: 091 | Disposition: A | Discharge status: Skilled Nursing Facility | Payer: Medicare Other | Attending: Internal Medicine | Admitting: Internal Medicine

## 2013-11-07 DIAGNOSIS — R4789 Other speech disturbances: Secondary | ICD-10-CM | POA: Diagnosis not present

## 2013-11-07 DIAGNOSIS — C8590 Non-Hodgkin lymphoma, unspecified, unspecified site: Secondary | ICD-10-CM

## 2013-11-07 DIAGNOSIS — I428 Other cardiomyopathies: Secondary | ICD-10-CM | POA: Diagnosis present

## 2013-11-07 DIAGNOSIS — K648 Other hemorrhoids: Secondary | ICD-10-CM | POA: Diagnosis present

## 2013-11-07 DIAGNOSIS — R710 Precipitous drop in hematocrit: Secondary | ICD-10-CM | POA: Diagnosis not present

## 2013-11-07 DIAGNOSIS — R471 Dysarthria and anarthria: Secondary | ICD-10-CM | POA: Diagnosis not present

## 2013-11-07 DIAGNOSIS — E872 Acidosis, unspecified: Secondary | ICD-10-CM | POA: Diagnosis not present

## 2013-11-07 DIAGNOSIS — Z9221 Personal history of antineoplastic chemotherapy: Secondary | ICD-10-CM | POA: Diagnosis not present

## 2013-11-07 DIAGNOSIS — Z87898 Personal history of other specified conditions: Secondary | ICD-10-CM

## 2013-11-07 DIAGNOSIS — G929 Unspecified toxic encephalopathy: Principal | ICD-10-CM | POA: Diagnosis present

## 2013-11-07 DIAGNOSIS — E861 Hypovolemia: Secondary | ICD-10-CM | POA: Diagnosis present

## 2013-11-07 DIAGNOSIS — D759 Disease of blood and blood-forming organs, unspecified: Secondary | ICD-10-CM | POA: Diagnosis present

## 2013-11-07 DIAGNOSIS — R339 Retention of urine, unspecified: Secondary | ICD-10-CM | POA: Diagnosis present

## 2013-11-07 DIAGNOSIS — M25571 Pain in right ankle and joints of right foot: Secondary | ICD-10-CM

## 2013-11-07 DIAGNOSIS — R4701 Aphasia: Secondary | ICD-10-CM | POA: Diagnosis not present

## 2013-11-07 DIAGNOSIS — I5022 Chronic systolic (congestive) heart failure: Secondary | ICD-10-CM | POA: Diagnosis present

## 2013-11-07 DIAGNOSIS — R809 Proteinuria, unspecified: Secondary | ICD-10-CM | POA: Diagnosis present

## 2013-11-07 DIAGNOSIS — E781 Pure hyperglyceridemia: Secondary | ICD-10-CM | POA: Diagnosis present

## 2013-11-07 DIAGNOSIS — R1310 Dysphagia, unspecified: Secondary | ICD-10-CM | POA: Diagnosis not present

## 2013-11-07 DIAGNOSIS — R29898 Other symptoms and signs involving the musculoskeletal system: Secondary | ICD-10-CM | POA: Diagnosis not present

## 2013-11-07 DIAGNOSIS — G92 Toxic encephalopathy: Principal | ICD-10-CM | POA: Diagnosis present

## 2013-11-07 DIAGNOSIS — E785 Hyperlipidemia, unspecified: Secondary | ICD-10-CM | POA: Diagnosis present

## 2013-11-07 DIAGNOSIS — Z807 Family history of other malignant neoplasms of lymphoid, hematopoietic and related tissues: Secondary | ICD-10-CM | POA: Diagnosis not present

## 2013-11-07 DIAGNOSIS — E46 Unspecified protein-calorie malnutrition: Secondary | ICD-10-CM | POA: Diagnosis present

## 2013-11-07 DIAGNOSIS — K5900 Constipation, unspecified: Secondary | ICD-10-CM

## 2013-11-07 DIAGNOSIS — I447 Left bundle-branch block, unspecified: Secondary | ICD-10-CM

## 2013-11-07 DIAGNOSIS — I6350 Cerebral infarction due to unspecified occlusion or stenosis of unspecified cerebral artery: Secondary | ICD-10-CM | POA: Diagnosis not present

## 2013-11-07 DIAGNOSIS — K5732 Diverticulitis of large intestine without perforation or abscess without bleeding: Secondary | ICD-10-CM

## 2013-11-07 DIAGNOSIS — I959 Hypotension, unspecified: Secondary | ICD-10-CM | POA: Diagnosis present

## 2013-11-07 DIAGNOSIS — E870 Hyperosmolality and hypernatremia: Secondary | ICD-10-CM | POA: Diagnosis present

## 2013-11-07 DIAGNOSIS — Z87440 Personal history of urinary (tract) infections: Secondary | ICD-10-CM | POA: Diagnosis not present

## 2013-11-07 DIAGNOSIS — I10 Essential (primary) hypertension: Secondary | ICD-10-CM | POA: Diagnosis present

## 2013-11-07 DIAGNOSIS — Z7982 Long term (current) use of aspirin: Secondary | ICD-10-CM | POA: Diagnosis not present

## 2013-11-07 DIAGNOSIS — K644 Residual hemorrhoidal skin tags: Secondary | ICD-10-CM | POA: Diagnosis present

## 2013-11-07 DIAGNOSIS — I739 Peripheral vascular disease, unspecified: Secondary | ICD-10-CM | POA: Diagnosis present

## 2013-11-07 DIAGNOSIS — E871 Hypo-osmolality and hyponatremia: Secondary | ICD-10-CM | POA: Diagnosis present

## 2013-11-07 DIAGNOSIS — E878 Other disorders of electrolyte and fluid balance, not elsewhere classified: Secondary | ICD-10-CM

## 2013-11-07 DIAGNOSIS — J189 Pneumonia, unspecified organism: Secondary | ICD-10-CM | POA: Diagnosis present

## 2013-11-07 DIAGNOSIS — Z79899 Other long term (current) drug therapy: Secondary | ICD-10-CM

## 2013-11-07 DIAGNOSIS — Z87891 Personal history of nicotine dependence: Secondary | ICD-10-CM | POA: Diagnosis not present

## 2013-11-07 DIAGNOSIS — M545 Low back pain, unspecified: Secondary | ICD-10-CM | POA: Diagnosis present

## 2013-11-07 DIAGNOSIS — R569 Unspecified convulsions: Secondary | ICD-10-CM | POA: Diagnosis present

## 2013-11-07 DIAGNOSIS — D696 Thrombocytopenia, unspecified: Secondary | ICD-10-CM | POA: Diagnosis present

## 2013-11-07 DIAGNOSIS — K921 Melena: Secondary | ICD-10-CM

## 2013-11-07 DIAGNOSIS — K5731 Diverticulosis of large intestine without perforation or abscess with bleeding: Secondary | ICD-10-CM | POA: Diagnosis present

## 2013-11-07 DIAGNOSIS — K625 Hemorrhage of anus and rectum: Secondary | ICD-10-CM | POA: Diagnosis not present

## 2013-11-07 DIAGNOSIS — N170 Acute kidney failure with tubular necrosis: Secondary | ICD-10-CM | POA: Diagnosis present

## 2013-11-07 DIAGNOSIS — C8589 Other specified types of non-Hodgkin lymphoma, extranodal and solid organ sites: Secondary | ICD-10-CM

## 2013-11-07 DIAGNOSIS — N3944 Nocturnal enuresis: Secondary | ICD-10-CM | POA: Diagnosis present

## 2013-11-07 DIAGNOSIS — R4182 Altered mental status, unspecified: Secondary | ICD-10-CM | POA: Diagnosis present

## 2013-11-07 DIAGNOSIS — R531 Weakness: Secondary | ICD-10-CM

## 2013-11-07 DIAGNOSIS — Z1211 Encounter for screening for malignant neoplasm of colon: Secondary | ICD-10-CM | POA: Diagnosis not present

## 2013-11-07 DIAGNOSIS — D62 Acute posthemorrhagic anemia: Secondary | ICD-10-CM | POA: Diagnosis not present

## 2013-11-07 DIAGNOSIS — R9401 Abnormal electroencephalogram [EEG]: Secondary | ICD-10-CM | POA: Diagnosis not present

## 2013-11-07 DIAGNOSIS — N390 Urinary tract infection, site not specified: Secondary | ICD-10-CM | POA: Diagnosis present

## 2013-11-07 DIAGNOSIS — I5020 Unspecified systolic (congestive) heart failure: Secondary | ICD-10-CM | POA: Diagnosis present

## 2013-11-07 DIAGNOSIS — Z8572 Personal history of non-Hodgkin lymphomas: Secondary | ICD-10-CM | POA: Diagnosis present

## 2013-11-07 DIAGNOSIS — N179 Acute kidney failure, unspecified: Secondary | ICD-10-CM

## 2013-11-07 DIAGNOSIS — R12 Heartburn: Secondary | ICD-10-CM

## 2013-11-07 LAB — URINALYSIS, ROUTINE W REFLEX MICROSCOPIC
Glucose, UA: NEGATIVE mg/dL
Ketones, ur: 15 mg/dL — AB
Leukocytes, UA: NEGATIVE
Nitrite: NEGATIVE
PROTEIN: 100 mg/dL — AB
Specific Gravity, Urine: 1.021 (ref 1.005–1.030)
UROBILINOGEN UA: 0.2 mg/dL (ref 0.0–1.0)
pH: 5 (ref 5.0–8.0)

## 2013-11-07 LAB — BASIC METABOLIC PANEL
BUN: 33 mg/dL — ABNORMAL HIGH (ref 6–23)
CALCIUM: 8.8 mg/dL (ref 8.4–10.5)
CO2: 24 meq/L (ref 19–32)
Chloride: 84 mEq/L — ABNORMAL LOW (ref 96–112)
Creatinine, Ser: 1.16 mg/dL — ABNORMAL HIGH (ref 0.50–1.10)
GFR calc Af Amer: 49 mL/min — ABNORMAL LOW (ref >90)
GFR, EST NON AFRICAN AMERICAN: 42 mL/min — AB (ref >90)
Glucose, Bld: 113 mg/dL — ABNORMAL HIGH (ref 70–99)
POTASSIUM: 4 meq/L (ref 3.7–5.3)
SODIUM: 122 meq/L — AB (ref 137–147)

## 2013-11-07 LAB — CBC WITH DIFFERENTIAL/PLATELET
BASOS ABS: 0 10*3/uL (ref 0.0–0.1)
Basophils Relative: 0 % (ref 0–1)
EOS PCT: 1 % (ref 0–5)
Eosinophils Absolute: 0.1 10*3/uL (ref 0.0–0.7)
HCT: 35.2 % — ABNORMAL LOW (ref 36.0–46.0)
Hemoglobin: 12.5 g/dL (ref 12.0–15.0)
LYMPHS PCT: 14 % (ref 12–46)
Lymphs Abs: 1.3 10*3/uL (ref 0.7–4.0)
MCH: 32 pg (ref 26.0–34.0)
MCHC: 35.5 g/dL (ref 30.0–36.0)
MCV: 90 fL (ref 78.0–100.0)
Monocytes Absolute: 0.3 10*3/uL (ref 0.1–1.0)
Monocytes Relative: 3 % (ref 3–12)
NEUTROS PCT: 82 % — AB (ref 43–77)
Neutro Abs: 7.7 10*3/uL (ref 1.7–7.7)
PLATELETS: 188 10*3/uL (ref 150–400)
RBC: 3.91 MIL/uL (ref 3.87–5.11)
RDW: 13.2 % (ref 11.5–15.5)
WBC: 9.3 10*3/uL (ref 4.0–10.5)

## 2013-11-07 LAB — I-STAT TROPONIN, ED: TROPONIN I, POC: 0 ng/mL (ref 0.00–0.08)

## 2013-11-07 LAB — URINE MICROSCOPIC-ADD ON

## 2013-11-07 LAB — I-STAT CG4 LACTIC ACID, ED: LACTIC ACID, VENOUS: 0.64 mmol/L (ref 0.5–2.2)

## 2013-11-07 MED ORDER — SODIUM CHLORIDE 0.9 % IV SOLN
INTRAVENOUS | Status: DC
  Administered 2013-11-07: 23:00:00 via INTRAVENOUS

## 2013-11-07 MED ORDER — DEXTROSE 5 % IV SOLN
1.0000 g | Freq: Once | INTRAVENOUS | Status: AC
  Administered 2013-11-07: 1 g via INTRAVENOUS
  Filled 2013-11-07: qty 10

## 2013-11-07 MED ORDER — DEXTROSE 5 % IV SOLN
500.0000 mg | Freq: Once | INTRAVENOUS | Status: AC
  Administered 2013-11-08: 500 mg via INTRAVENOUS

## 2013-11-07 NOTE — ED Notes (Signed)
Dr. David at bedside. 

## 2013-11-07 NOTE — ED Provider Notes (Signed)
CSN: [id number]     Arrival date & time 11/07/13  2037 History   First MD Initiated Contact with Patient 11/07/13 2052     Chief Complaint  Patient presents with  . Altered Mental Status     (Consider location/radiation/quality/duration/timing/severity/associated sxs/prior Treatment) The history is provided by the patient and medical records.   Level 5 Caveat:  AMS This is an 80 year old female with past medical history significant for hypertension, hyperlipidemia, cardiomyopathy, lymphoma, presenting to the ED for altered metal status.  Per patient's son, patient's mental status and decrease the past several days. He states noticed a significant change yesterday, took her to the urologist office and she was diagnosed with a urinary tract infection. She was started on doxycycline. Son states he called her earlier this morning and she seemed to be getting worse so primary care physician encourage her to be further evaluated here. Patient has had no recent fever or illness.  She denies any chest pain, shortness of breath, abdominal pain, nausea, vomiting, or diarrhea. Mild non-productive cough.  No fever or chills. No recent changes in medications.  Patient has no documented history of dementia.  Son states at baseline pt is independent, lives alone, walks unassisted, and is usually able to carry on a normal conversation.  No recent falls, injuries, or head trauma.  Past Medical History  Diagnosis Date  . LEFT BUNDLE BRANCH BLOCK   . HYPERTENSION   . HYPERLIPIDEMIA   . CARDIOMYOPATHY   . LYMPHOMA   . DIVERTICULITIS, COLON   . Hemorrhoids, internal   . H/O: hysterectomy 1977  . Maintenance chemotherapy    Past Surgical History  Procedure Laterality Date  . Appendectomy  1949  . Toe surgery    . Lymphoma biopsies     Family History  Problem Relation Age of Onset  . Lymphoma Mother   . Sudden death Father   . Hypertension Neg Hx   . Hyperlipidemia Neg Hx   . Heart attack Neg Hx   .  Diabetes Neg Hx    History  Substance Use Topics  . Smoking status: Former Research scientist (life sciences)  . Smokeless tobacco: Not on file  . Alcohol Use: No   OB History   Grav Para Term Preterm Abortions TAB SAB Ect Mult Living                 Review of Systems  Unable to perform ROS: Mental status change      Allergies  Ezetimibe; Hydrocodone-acetaminophen; Nitrofurantoin; Oxycodone hcl; and Sulfonamide derivatives  Home Medications   Prior to Admission medications   Medication Sig Start Date End Date Taking? Authorizing Provider  aspirin EC 81 MG tablet Take 1 tablet (81 mg total) by mouth daily. 03/23/13  Yes Lelon Perla, MD  carvedilol (COREG) 3.125 MG tablet Take 1 tablet (3.125 mg total) by mouth 2 (two) times daily with a meal. 03/03/13  Yes Lelon Perla, MD  clonazePAM (KLONOPIN) 1 MG tablet Take 1 mg by mouth at bedtime as needed. 10/16/13  Yes Historical Provider, MD  desmopressin (DDAVP) 0.2 MG tablet Take 1 tablet by mouth at bedtime.  02/19/11  Yes Historical Provider, MD  doxycycline (VIBRAMYCIN) 100 MG capsule Take 100 mg by mouth 2 (two) times daily.   Yes Historical Provider, MD  lisinopril (PRINIVIL,ZESTRIL) 10 MG tablet Take 10 mg by mouth daily.   Yes Historical Provider, MD  Multiple Vitamin (MULTIVITAMIN WITH MINERALS) TABS tablet Take 1 tablet by mouth daily.   Yes  Historical Provider, MD  lisinopril (PRINIVIL,ZESTRIL) 10 MG tablet TAKE ONE TABLET BY MOUTH ONE TIME DAILY 04/17/13   Lelon Perla, MD  Multiple Vitamin (MULTIVITAMIN) capsule Take 1 capsule by mouth daily.      Historical Provider, MD   BP 163/72  Pulse 95  Temp(Src) 99.2 F (37.3 C) (Oral)  Resp 20  SpO2 95%  Physical Exam  Nursing note and vitals reviewed. Constitutional: She appears well-developed and well-nourished. No distress.  HENT:  Head: Normocephalic and atraumatic.  Mouth/Throat: Oropharynx is clear and moist.  Dry mucous membranes  Eyes: Conjunctivae and EOM are normal. Pupils are  equal, round, and reactive to light.  Neck: Normal range of motion. Neck supple.  Cardiovascular: Normal rate, regular rhythm and normal heart sounds.   Pulmonary/Chest: Effort normal and breath sounds normal. No respiratory distress. She has no wheezes.  Abdominal: Soft. Bowel sounds are normal. There is no tenderness. There is no guarding.  Musculoskeletal: Normal range of motion. She exhibits no edema.  Neurological: She is alert. She has normal strength. She displays no tremor. No cranial nerve deficit or sensory deficit. She displays no seizure activity.  Patient is alert, oriented to self and place only; answers questions and follows commands when prompted, occasionally slow to respond; normal strength upper and lower extremities bilaterally; no facial asymmetry appreciated; normal sensation to light touch throughout  Skin: Skin is warm and dry. She is not diaphoretic.  Psychiatric: She has a normal mood and affect.    ED Course  Procedures (including critical care time) Labs Review Labs Reviewed  CBC WITH DIFFERENTIAL - Abnormal; Notable for the following:    HCT 35.2 (*)    Neutrophils Relative % 82 (*)    All other components within normal limits  BASIC METABOLIC PANEL - Abnormal; Notable for the following:    Sodium 122 (*)    Chloride 84 (*)    Glucose, Bld 113 (*)    BUN 33 (*)    Creatinine, Ser 1.16 (*)    GFR calc non Af Amer 42 (*)    GFR calc Af Amer 49 (*)    All other components within normal limits  URINALYSIS, ROUTINE W REFLEX MICROSCOPIC - Abnormal; Notable for the following:    APPearance CLOUDY (*)    Hgb urine dipstick LARGE (*)    Bilirubin Urine SMALL (*)    Ketones, ur 15 (*)    Protein, ur 100 (*)    All other components within normal limits  URINE MICROSCOPIC-ADD ON - Abnormal; Notable for the following:    Bacteria, UA FEW (*)    Casts GRANULAR CAST (*)    All other components within normal limits  URINE CULTURE  I-STAT CG4 LACTIC ACID, ED   I-STAT TROPOININ, ED    Imaging Review Ct Head Wo Contrast  11/07/2013   CLINICAL DATA:  Severe headache.  EXAM: CT HEAD WITHOUT CONTRAST  TECHNIQUE: Contiguous axial images were obtained from the base of the skull through the vertex without intravenous contrast.  COMPARISON:  None.  FINDINGS: Skull and Sinuses:No acute osseous findings. Clear paranasal sinuses.  Orbits: Unremarkable for age.  Brain: No evidence of acute abnormality, such as acute infarction, hemorrhage, hydrocephalus, or mass lesion/mass effect. Enlarged extra-axial spaces in the bilateral frontal and parietal region is related to atrophy given no mass effect to suggest hygroma. Low-attenuation area along the lower left sylvian fissure is most consistent with dilated perivascular space.  IMPRESSION: No acute intracranial abnormality.  Electronically Signed   By: Jorje Guild M.D.   On: 11/07/2013 23:04   Dg Chest Port 1 View  11/07/2013   CLINICAL DATA:  Patient is confused, diagnosed with UTI yesterday  EXAM: PORTABLE CHEST - 1 VIEW  COMPARISON:  CT CHEST-ABD-PELV W/ CM dated 09/08/2013  FINDINGS: There is left lower lobe airspace disease. There is no pleural effusion or pneumothorax. Stable cardiomediastinal silhouette. Thoracic aortic atherosclerosis. Unremarkable osseous structures.  IMPRESSION: Left lower lobe airspace disease which may reflect atelectasis versus is infiltrate.   Electronically Signed   By: Kathreen Devoid   On: 11/07/2013 22:34     EKG Interpretation None      MDM   Final diagnoses:  Altered mental status  UTI (lower urinary tract infection)  Hyponatremia  Hypochloremia   A 9-year-old female presenting for 2 days of altered mental status. Seen yesterday at urology office, diagnosed with UTI but mental status continues to decline.  On exam, pt is alert and oriented to self and place, responses are sensical but somewhat slow.  She appears clinically dry but is afebrile and non-toxic.  Neuro exam is  non-focal.  Will initiate work-up including CT head, CXR, EKG, CBC, BMP, lactic acid, u/a, urine culture.    EKG sinus rhythm without ischemic change. Troponin is negative. CT head negative for acute findings.  Chest x-ray with questionable left lower lobe infiltrate versus atelectasis.  Urine was likely infection, culture pending. Labs with significant hyponatremia and hypochloremia as well as elevated serum creatinine when compared with previous.  Altered mental status is likely multifactorial. Patient has been given fluids, Rocephin, and azithromycin for UTI/CAP coverage.  She will be admitted for further management.  Discussed with Dr. Shanon Brow who will admit to med-surg.  Temp admission orders placed.  Larene Pickett, PA-C 11/08/13 [place], PA-C 11/08/13 [id number]

## 2013-11-07 NOTE — H&P (Signed)
PCP:   Laurel Dimmer, MD   Chief Complaint:  confusion  HPI: 80 yo female highly functioning lives alone has h/o recurrrent lymphoma for over 10 years initially found by her urologist around one of her ureters (several recurrences and rounds of chemo being followed by [facility]), LBBB, htn, hld, cardiomyopathy last EF around 40% comes in with several days of worsening confusion noticed by her son.  About 2 weeks ago she started having lower back pain, went to see her orthopedic doctor and was put on a round of prednisone.  Son says her back pain resolved and she felt wonderful on the prednisone.  Finished that last week.  A couple days after the finishing that she started with pain lower in her back and towards the side (he does not remember which side).  She started to get confused, just mildly on Sunday night (2 night ago).  He took her to see her urologist yesterday who diagnosed her with a uti and placed her on doxycycline.  She took 2 doses of that, but got much worse over the night.  She has not been eating or drinking hardly.  He called ems today when he went to check on her, and she was much worse than when he checked on her 12 hours earlier.  No cough.  She did vomit once today.  No diarrhea.  She complains of no pain, she cannot provide a reliable history due to her mental status changes.  She normally is very alert and with sharp memory.  Review of Systems:  Positive and negative as per HPI otherwise all other systems are negative per son  Past Medical History: Past Medical History  Diagnosis Date  . LEFT BUNDLE BRANCH BLOCK   . HYPERTENSION   . HYPERLIPIDEMIA   . CARDIOMYOPATHY   . LYMPHOMA   . DIVERTICULITIS, COLON   . Hemorrhoids, internal   . H/O: hysterectomy 1977  . Maintenance chemotherapy    Past Surgical History  Procedure Laterality Date  . Appendectomy  1949  . Toe surgery    . Lymphoma biopsies      Medications: Prior to Admission medications   Medication  Sig Start Date End Date Taking? Authorizing Provider  aspirin EC 81 MG tablet Take 1 tablet (81 mg total) by mouth daily. 03/23/13  Yes Lelon Perla, MD  carvedilol (COREG) 3.125 MG tablet Take 1 tablet (3.125 mg total) by mouth 2 (two) times daily with a meal. 03/03/13  Yes Lelon Perla, MD  clonazePAM (KLONOPIN) 1 MG tablet Take 1 mg by mouth at bedtime as needed (for sleep).  10/16/13  Yes Historical Provider, MD  desmopressin (DDAVP) 0.2 MG tablet Take 1 tablet by mouth at bedtime.  02/19/11  Yes Historical Provider, MD  doxycycline (VIBRAMYCIN) 100 MG capsule Take 100 mg by mouth 2 (two) times daily.   Yes Historical Provider, MD  ibuprofen (ADVIL,MOTRIN) 200 MG tablet Take 400 mg by mouth every 6 (six) hours as needed for moderate pain.   Yes Historical Provider, MD  lisinopril (PRINIVIL,ZESTRIL) 10 MG tablet Take 10 mg by mouth daily.   Yes Historical Provider, MD  Multiple Vitamin (MULTIVITAMIN WITH MINERALS) TABS tablet Take 1 tablet by mouth daily.   Yes Historical Provider, MD    Allergies:   Allergies  Allergen Reactions  . Ezetimibe Other (See Comments)  . Hydrocodone-Acetaminophen Other (See Comments)  . Nitrofurantoin Other (See Comments)  . Other Itching and Rash    "chemo" medication given  at [facility] in [place], [place]  . Oxycodone Hcl Other (See Comments)  . Sulfonamide Derivatives Other (See Comments)    Social History:  reports that she has quit smoking. She does not have any smokeless tobacco history on file. She reports that she does not drink alcohol. Her drug history is not on file.  Family History: Family History  Problem Relation Age of Onset  . Lymphoma Mother   . Sudden death Father   . Hypertension Neg Hx   . Hyperlipidemia Neg Hx   . Heart attack Neg Hx   . Diabetes Neg Hx     Physical Exam: Filed Vitals:   11/07/13 2151 11/07/13 2215 11/07/13 2230 11/08/13 0000  BP:  133/75 131/63 147/69  Pulse:  88 87 77  Temp: 98.5 F (36.9 C)      TempSrc: Rectal     Resp:  23 19 22   SpO2:  94% 95% 96%   General appearance: alert, cooperative and no distress Head: Normocephalic, without obvious abnormality, atraumatic Eyes: negative Nose: Nares normal. Septum midline. Mucosa normal. No drainage or sinus tenderness. Neck: no JVD and supple, symmetrical, trachea midline Lungs: clear to auscultation bilaterally Heart: regular rate and rhythm, S1, S2 normal, no murmur, click, rub or gallop Abdomen: soft, non-tender; bowel sounds normal; no masses,  no organomegaly Extremities: extremities normal, atraumatic, no cyanosis or edema Pulses: 2+ and symmetric Skin: Skin color, texture, turgor normal. No rashes or lesions Neurologic: Grossly normal  Oriented to person only.  Can follow simple commands.  Pleasantly confused.   Labs on Admission:   Recent Labs  11/07/13 2125  NA 122*  K 4.0  CL 84*  CO2 24  GLUCOSE 113*  BUN 33*  CREATININE 1.16*  CALCIUM 8.8    Recent Labs  11/07/13 2125  WBC 9.3  NEUTROABS 7.7  HGB 12.5  HCT 35.2*  MCV 90.0  PLT 188    Radiological Exams on Admission: Ct Head Wo Contrast  11/07/2013   CLINICAL DATA:  Severe headache.  EXAM: CT HEAD WITHOUT CONTRAST  TECHNIQUE: Contiguous axial images were obtained from the base of the skull through the vertex without intravenous contrast.  COMPARISON:  None.  FINDINGS: Skull and Sinuses:No acute osseous findings. Clear paranasal sinuses.  Orbits: Unremarkable for age.  Brain: No evidence of acute abnormality, such as acute infarction, hemorrhage, hydrocephalus, or mass lesion/mass effect. Enlarged extra-axial spaces in the bilateral frontal and parietal region is related to atrophy given no mass effect to suggest hygroma. Low-attenuation area along the lower left sylvian fissure is most consistent with dilated perivascular space.  IMPRESSION: No acute intracranial abnormality.   Electronically Signed   By: Jorje Guild M.D.   On: 11/07/2013 23:04    Dg Chest Port 1 View  11/07/2013   CLINICAL DATA:  Patient is confused, diagnosed with UTI yesterday  EXAM: PORTABLE CHEST - 1 VIEW  COMPARISON:  CT CHEST-ABD-PELV W/ CM dated 09/08/2013  FINDINGS: There is left lower lobe airspace disease. There is no pleural effusion or pneumothorax. Stable cardiomediastinal silhouette. Thoracic aortic atherosclerosis. Unremarkable osseous structures.  IMPRESSION: Left lower lobe airspace disease which may reflect atelectasis versus is infiltrate.   Electronically Signed   By: Kathreen Devoid   On: 11/07/2013 22:34    Assessment/Plan  80 yo female with AMS from possible pna, uti, dehydration, and hyponatremia  Principal Problem:   Altered mental status-  Multiple reasons why she could be confused.  No focal deficits.  Treat  infection and dehydration and she should improve over the next couple days.  freq neuro cks.    Active Problems:   LYMPHOMA-  ??? Would recommended ct scan after she is appropriately rehydrated has mild AKI.  She has not had any imaging for lymphoma monitoring in about 6 months....the way in which it was found intially was due to urological issues.    HYPERLIPIDEMIA   HYPERTENSION   CARDIOMYOPATHY-  EF 40%   UNSPECIFIED PERIPHERAL VASCULAR DISEASE   Hyponatremia-  ivf ns repeat labs at 5 am   UTI (lower urinary tract infection) rocephin.  F/u on cultures that were done yesterday in urology office   CAP (community acquired pneumonia)  Per cxr, clinically hard to tell if this is an issue.  pna pathway.  rocphin and azithromycin.    Velena Keegan A Shanon Brow 11/08/2013, 12:13 AM

## 2013-11-07 NOTE — ED Notes (Signed)
EMS/ seen yesterday dx uti. Today per son pt has not improved. Confused/ lethargic and not take oral challenge. Per EMS pt is not increased in symptoms just has not improved.

## 2013-11-07 NOTE — ED Notes (Signed)
CG-4 reported to Lisa Sanders-PA 

## 2013-11-07 NOTE — ED Notes (Addendum)
Pt appears A&Ox4, speaking in clear, complete sentences but can be slow to respond at times. Pt is able to make eye contact.

## 2013-11-07 NOTE — Progress Notes (Signed)
Report received from [place], [place] in ED.

## 2013-11-08 LAB — BASIC METABOLIC PANEL
BUN: 35 mg/dL — AB (ref 6–23)
BUN: 36 mg/dL — AB (ref 6–23)
BUN: 37 mg/dL — AB (ref 6–23)
BUN: 42 mg/dL — ABNORMAL HIGH (ref 6–23)
BUN: 44 mg/dL — ABNORMAL HIGH (ref 6–23)
CALCIUM: 8.5 mg/dL (ref 8.4–10.5)
CALCIUM: 8.3 mg/dL — AB (ref 8.4–10.5)
CALCIUM: 8 mg/dL — AB (ref 8.4–10.5)
CALCIUM: 7.8 mg/dL — AB (ref 8.4–10.5)
CALCIUM: 7.7 mg/dL — AB (ref 8.4–10.5)
CHLORIDE: 89 meq/L — AB (ref 96–112)
CO2: 20 mEq/L (ref 19–32)
CO2: 19 mEq/L (ref 19–32)
CO2: 21 mEq/L (ref 19–32)
CO2: 22 mEq/L (ref 19–32)
CO2: 21 mEq/L (ref 19–32)
CREATININE: 1.26 mg/dL — AB (ref 0.50–1.10)
CREATININE: 1.46 mg/dL — AB (ref 0.50–1.10)
Chloride: 85 mEq/L — ABNORMAL LOW (ref 96–112)
Chloride: 87 mEq/L — ABNORMAL LOW (ref 96–112)
Chloride: 91 mEq/L — ABNORMAL LOW (ref 96–112)
Chloride: 90 mEq/L — ABNORMAL LOW (ref 96–112)
Creatinine, Ser: 1.19 mg/dL — ABNORMAL HIGH (ref 0.50–1.10)
Creatinine, Ser: 1.29 mg/dL — ABNORMAL HIGH (ref 0.50–1.10)
Creatinine, Ser: 1.51 mg/dL — ABNORMAL HIGH (ref 0.50–1.10)
GFR calc Af Amer: 47 mL/min — ABNORMAL LOW (ref >90)
GFR calc Af Amer: 44 mL/min — ABNORMAL LOW (ref >90)
GFR calc Af Amer: 43 mL/min — ABNORMAL LOW (ref >90)
GFR calc non Af Amer: 38 mL/min — ABNORMAL LOW (ref >90)
GFR calc non Af Amer: 37 mL/min — ABNORMAL LOW (ref >90)
GFR calc non Af Amer: 32 mL/min — ABNORMAL LOW (ref >90)
GFR, EST AFRICAN AMERICAN: 37 mL/min — AB (ref >90)
GFR, EST AFRICAN AMERICAN: 35 mL/min — AB (ref >90)
GFR, EST NON AFRICAN AMERICAN: 41 mL/min — AB (ref >90)
GFR, EST NON AFRICAN AMERICAN: 31 mL/min — AB (ref >90)
GLUCOSE: 90 mg/dL (ref 70–99)
GLUCOSE: 94 mg/dL (ref 70–99)
Glucose, Bld: 186 mg/dL — ABNORMAL HIGH (ref 70–99)
Glucose, Bld: 97 mg/dL (ref 70–99)
Glucose, Bld: 97 mg/dL (ref 70–99)
Potassium: 4.1 mEq/L (ref 3.7–5.3)
Potassium: 4.2 mEq/L (ref 3.7–5.3)
Potassium: 4.3 mEq/L (ref 3.7–5.3)
Potassium: 4.4 mEq/L (ref 3.7–5.3)
Potassium: 4.5 mEq/L (ref 3.7–5.3)
SODIUM: 119 meq/L — AB (ref 137–147)
SODIUM: 120 meq/L — AB (ref 137–147)
Sodium: 122 mEq/L — ABNORMAL LOW (ref 137–147)
Sodium: 118 mEq/L — CL (ref 137–147)
Sodium: 123 mEq/L — ABNORMAL LOW (ref 137–147)

## 2013-11-08 LAB — LEGIONELLA ANTIGEN, URINE: Legionella Antigen, Urine: NEGATIVE

## 2013-11-08 LAB — CBC WITH DIFFERENTIAL/PLATELET
BASOS PCT: 0 % (ref 0–1)
Basophils Absolute: 0 10*3/uL (ref 0.0–0.1)
EOS PCT: 1 % (ref 0–5)
Eosinophils Absolute: 0.1 10*3/uL (ref 0.0–0.7)
HCT: 34.9 % — ABNORMAL LOW (ref 36.0–46.0)
HEMOGLOBIN: 12.2 g/dL (ref 12.0–15.0)
LYMPHS ABS: 1 10*3/uL (ref 0.7–4.0)
Lymphocytes Relative: 10 % — ABNORMAL LOW (ref 12–46)
MCH: 31.9 pg (ref 26.0–34.0)
MCHC: 35 g/dL (ref 30.0–36.0)
MCV: 91.4 fL (ref 78.0–100.0)
MONOS PCT: 4 % (ref 3–12)
Monocytes Absolute: 0.4 10*3/uL (ref 0.1–1.0)
NEUTROS PCT: 85 % — AB (ref 43–77)
Neutro Abs: 8.8 10*3/uL — ABNORMAL HIGH (ref 1.7–7.7)
Platelets: 161 10*3/uL (ref 150–400)
RBC: 3.82 MIL/uL — AB (ref 3.87–5.11)
RDW: 13.4 % (ref 11.5–15.5)
WBC: 10.3 10*3/uL (ref 4.0–10.5)

## 2013-11-08 LAB — URINE CULTURE
Colony Count: NO GROWTH
Culture: NO GROWTH

## 2013-11-08 LAB — SODIUM, URINE, RANDOM: Sodium, Ur: <20 mEq/L

## 2013-11-08 LAB — OSMOLALITY: Osmolality: 259 mOsm/kg — ABNORMAL LOW (ref 275–300)

## 2013-11-08 LAB — STREP PNEUMONIAE URINARY ANTIGEN: Strep Pneumo Urinary Antigen: NEGATIVE

## 2013-11-08 MED ORDER — SODIUM CHLORIDE 0.9 % IJ SOLN
10.0000 mL | Freq: Two times a day (BID) | INTRAMUSCULAR | Status: DC
  Administered 2013-11-09 – 2013-11-10 (×2): 10 mL
  Administered 2013-11-10: 30 mL
  Administered 2013-11-11: 10 mL
  Administered 2013-11-11: 20 mL
  Administered 2013-11-12 – 2013-11-13 (×2): 10 mL

## 2013-11-08 MED ORDER — ADULT MULTIVITAMIN W/MINERALS CH
1.0000 | ORAL_TABLET | Freq: Every day | ORAL | Status: DC
  Administered 2013-11-08 – 2013-11-17 (×10): 1 via ORAL
  Filled 2013-11-08 (×10): qty 1

## 2013-11-08 MED ORDER — CLONAZEPAM 0.5 MG PO TABS
1.0000 mg | ORAL_TABLET | Freq: Every evening | ORAL | Status: DC | PRN
  Administered 2013-11-12: 1 mg via ORAL
  Filled 2013-11-08: qty 2

## 2013-11-08 MED ORDER — WITCH HAZEL-GLYCERIN EX PADS
MEDICATED_PAD | CUTANEOUS | Status: DC | PRN
  Filled 2013-11-08: qty 100

## 2013-11-08 MED ORDER — CARVEDILOL 3.125 MG PO TABS
3.1250 mg | ORAL_TABLET | Freq: Two times a day (BID) | ORAL | Status: DC
  Administered 2013-11-08 – 2013-11-09 (×2): 3.125 mg via ORAL
  Filled 2013-11-08 (×5): qty 1

## 2013-11-08 MED ORDER — ENOXAPARIN SODIUM 30 MG/0.3ML ~~LOC~~ SOLN: 30.0000 mg | SUBCUTANEOUS | Status: DC

## 2013-11-08 MED ORDER — DEXTROSE 5 % IV SOLN
500.0000 mg | INTRAVENOUS | Status: DC
  Administered 2013-11-08 – 2013-11-10 (×2): 500 mg via INTRAVENOUS
  Filled 2013-11-08 (×3): qty 500

## 2013-11-08 MED ORDER — ENOXAPARIN SODIUM 40 MG/0.4ML ~~LOC~~ SOLN
40.0000 mg | SUBCUTANEOUS | Status: DC
  Administered 2013-11-08: 40 mg via SUBCUTANEOUS
  Filled 2013-11-08: qty 0.4

## 2013-11-08 MED ORDER — HYDROCORTISONE ACETATE 25 MG RE SUPP
25.0000 mg | Freq: Two times a day (BID) | RECTAL | Status: AC
  Administered 2013-11-08 – 2013-11-11 (×5): 25 mg via RECTAL
  Filled 2013-11-08 (×7): qty 1

## 2013-11-08 MED ORDER — SODIUM CHLORIDE 0.9 % IJ SOLN
10.0000 mL | INTRAMUSCULAR | Status: DC | PRN
  Administered 2013-11-08 – 2013-11-17 (×6): 10 mL

## 2013-11-08 MED ORDER — LISINOPRIL 10 MG PO TABS
10.0000 mg | ORAL_TABLET | Freq: Every day | ORAL | Status: DC
  Administered 2013-11-08: 10 mg via ORAL
  Filled 2013-11-08 (×2): qty 1

## 2013-11-08 MED ORDER — SODIUM CHLORIDE 0.9 % IV SOLN
INTRAVENOUS | Status: DC
  Administered 2013-11-08: 23:00:00 via INTRAVENOUS

## 2013-11-08 MED ORDER — SODIUM CHLORIDE 3 % IV SOLN
INTRAVENOUS | Status: DC
Start: 2013-11-08 — End: 2013-11-08
  Filled 2013-11-08 (×2): qty 500

## 2013-11-08 MED ORDER — IBUPROFEN 400 MG PO TABS
400.0000 mg | ORAL_TABLET | Freq: Four times a day (QID) | ORAL | Status: DC | PRN
  Filled 2013-11-08: qty 1

## 2013-11-08 MED ORDER — FUROSEMIDE 10 MG/ML IJ SOLN: 40.0000 mg | Freq: Once | INTRAMUSCULAR | Status: DC

## 2013-11-08 MED ORDER — SODIUM CHLORIDE 0.9 % IV SOLN
INTRAVENOUS | Status: AC
  Administered 2013-11-08: 01:00:00 via INTRAVENOUS

## 2013-11-08 MED ORDER — DESMOPRESSIN ACETATE 0.2 MG PO TABS
200.0000 ug | ORAL_TABLET | Freq: Every day | ORAL | Status: DC
  Administered 2013-11-08: 200 ug via ORAL
  Filled 2013-11-08 (×2): qty 1

## 2013-11-08 MED ORDER — DEXTROSE 5 % IV SOLN
1.0000 g | INTRAVENOUS | Status: DC
  Administered 2013-11-08 – 2013-11-09 (×2): 1 g via INTRAVENOUS
  Filled 2013-11-08 (×3): qty 10

## 2013-11-08 MED ORDER — ASPIRIN EC 81 MG PO TBEC
81.0000 mg | DELAYED_RELEASE_TABLET | Freq: Every day | ORAL | Status: DC
  Administered 2013-11-08: 81 mg via ORAL
  Filled 2013-11-08: qty 1

## 2013-11-08 NOTE — Progress Notes (Signed)
[facility] PROGRESS NOTE  Cindy Mccormick TIR:[id number] DOB: Aug 04, 1927 DOA: 11/07/2013 PCP: Laurel Dimmer, MD    Assessment/Plan: Hyponatremia  Suspect this to be multifactorial- predominantly secondary to dehydration-given history of vomiting and decreased oral intake. Unfortunately also on DDAVP for nocturnal enuresis. Sodium was 122 on presentation, has decreased 119 this morning- in spite of being on IV fluids but was continued on DDAVP on admission, last dose yesterday evening. For now, stop DDAVP, looks almost euvolemic- but we'll continue IV fluid and recheck sodium.  We'll check a urine osmolality, serum osmolality and urine sodium.  UTI  Was treated previously by her urologist for UTI with doxycyline  UA on admit showed few bacteria  Urine cultures pending  On Rocephin-day 2  CAP?  C-XRY lower lobe airspace disease  On rocephin and azithromycin-day 2  Toxic metabolic encephalopathy  CT-head - unremarkable,  Likely secondary to pneumonia and UTI  Patient's baseline mental function is fully coherent, functioning with no deficits.Patient's currently still confused, only knows the location  Treat UTI, correct hyponatremia and follow clinical course.  Hypertension  Controled with Carvedilol, Lisinopril  History of Lymphoma  has a history of recurrent lymphoma around the ureters.  Follows with a urologist outpatient.   History of nocturnal enuresis - Hold DDAVP for now  Code Status: Full Family Communication: Son at bedside Disposition Plan: Pending PT evual - Home or SNF   Consultants:  none  Procedures:  none  Antibiotics:  Rocephin, Azithromycin  Subjective: Still very confused- not back to her usual baseline.  Objective: Filed Vitals:   11/08/13 0851  BP: 120/69  Pulse: 72  Temp:   Resp:     Intake/Output Summary (Last 24 hours) at 11/08/13 0947 Last data filed at 11/08/13 0507  Gross per 24 hour  Intake    120 ml   Output      0 ml  Net    120 ml   Filed Weights   11/08/13 0048  Weight: 54.8 kg (120 lb 13 oz)    Exam:  Physical Exam  Constitutional: She is oriented to place. She appears well-developed and well-nourished. No distress.  Neck: No JVD present.  Cardiovascular: Normal rate, regular rhythm and normal heart sounds.   Respiratory: Effort normal and breath sounds normal. No stridor. No respiratory distress. She has no wheezes. She exhibits no tenderness.  GI: Soft. Bowel sounds are normal. She exhibits no distension. There is no tenderness. There is no rebound.  Neurological: She is oriented to place. Very confused, Unable to ansewer some simple questions (birthday , day of week) Skin: Skin is warm and dry. She is not diaphoretic. No erythema.      Data Reviewed: Basic Metabolic Panel:  Recent Labs Lab 11/07/13 2125 11/08/13 0105 11/08/13 0815  NA 122* 119* 122*  K 4.0 4.1 4.2  CL 84* 85* 87*  CO2 24 20 19   GLUCOSE 113* 186* 90  BUN 33* 35* 36*  CREATININE 1.16* 1.19* 1.26*  CALCIUM 8.8 8.5 8.3*   CBC:  Recent Labs Lab 11/07/13 2125 11/08/13 0105  WBC 9.3 10.3  NEUTROABS 7.7 8.8*  HGB 12.5 12.2  HCT 35.2* 34.9*  MCV 90.0 91.4  PLT 188 161    Studies: Ct Head Wo Contrast  11/07/2013   CLINICAL DATA:  Severe headache.  EXAM: CT HEAD WITHOUT CONTRAST  TECHNIQUE: Contiguous axial images were obtained from the base of the skull through the vertex without intravenous contrast.  COMPARISON:  None.  FINDINGS: Skull and Sinuses:No acute osseous findings. Clear paranasal sinuses.  Orbits: Unremarkable for age.  Brain: No evidence of acute abnormality, such as acute infarction, hemorrhage, hydrocephalus, or mass lesion/mass effect. Enlarged extra-axial spaces in the bilateral frontal and parietal region is related to atrophy given no mass effect to suggest hygroma. Low-attenuation area along the lower left sylvian fissure is most consistent with dilated perivascular space.   IMPRESSION: No acute intracranial abnormality.   Electronically Signed   By: Jorje Guild M.D.   On: 11/07/2013 23:04   Dg Chest Port 1 View  11/07/2013   CLINICAL DATA:  Patient is confused, diagnosed with UTI yesterday  EXAM: PORTABLE CHEST - 1 VIEW  COMPARISON:  CT CHEST-ABD-PELV W/ CM dated 09/08/2013  FINDINGS: There is left lower lobe airspace disease. There is no pleural effusion or pneumothorax. Stable cardiomediastinal silhouette. Thoracic aortic atherosclerosis. Unremarkable osseous structures.  IMPRESSION: Left lower lobe airspace disease which may reflect atelectasis versus is infiltrate.   Electronically Signed   By: Kathreen Devoid   On: 11/07/2013 22:34    Scheduled Meds: . aspirin EC  81 mg Oral Daily  . azithromycin  500 mg Intravenous Q24H  . carvedilol  3.125 mg Oral BID WC  . cefTRIAXone (ROCEPHIN)  IV  1 g Intravenous Q24H  . enoxaparin (LOVENOX) injection  40 mg Subcutaneous Q24H  . lisinopril  10 mg Oral Daily  . multivitamin with minerals  1 tablet Oral Daily   Continuous Infusions: . sodium chloride 75 mL/hr at 11/08/13 0100    Principal Problem:   Altered mental status Active Problems:   LYMPHOMA   HYPERLIPIDEMIA   HYPERTENSION   CARDIOMYOPATHY   UNSPECIFIED PERIPHERAL VASCULAR DISEASE   Hyponatremia   UTI (lower urinary tract infection)   CAP (community acquired pneumonia)    Time spent: [place], Student-PA   [facility]  If 7PM-7AM, please contact night-coverage at [email or web address], password [facility] 11/08/2013, 9:47 AM  LOS: 1 day   Attending Patient seen and examined, above documentation was reviewed, necessary changes have been made. Patient has encephalopathy likely from underlying UTI and hyponatremia. Hyponatremia is multifactorial, however given the history of decreased oral intake, vomiting, suspect that dehydration is playing a role. Her son was trying to "keep up with fluids"- over the past few days, unfortunately she was also  taking DDAVP. Suspect hyponatremia mostly from dehydration, and worsened by ongoing DDAVP use. Will stop DDAVP (not stop on admission), looks almost euvolemic to me on exam-will continue with IV fluids, recheck sodium, and follow clinical course closely. Suspect that with continued treatment with IV antibiotics, and correction of sodium, her mental status should improve.  Nena Alexander MD

## 2013-11-08 NOTE — Progress Notes (Addendum)
Shift event: Pt here with hyponatremia. Na+ was going down today instead of up. DDAVP was d/c'd. Nephrology was consulted who Rx 3% saline. Attending called this NP at 1900 hrs with instructions to follow Na and if over 122 to stop 3% saline. Na+ now 123.  However, this NP spoke to RN on 5W. Pt never received any 3% saline because there was no bed available yet in SDU and policy prevents hanging 3% on the floor. So, at this point, the Na+ has risen 5 points without any saline and pt is stable otherwise. Therefore, will start 0.9% NS at 50cc/hr and continue to follow serial sodiums. Can hold TF to SDU for now.  Clance Boll, NP [facility] Update: 2330 hr Na came back at 120. Marland Kitchen9% NS increased to 100/hr and ordered TF to SDU for 3% to start and then stop regular IVF. Next Na due around 0530.  Also, RN had paged me earlier secondary to pt having small amount of bleeding from rectum with hemorrhoid. Pt on Anusol supp bid and ordered Tucks pads. Later, RN paged to say that pt had just had 2 stools with moderate to large amount of maroon colored stool. NP to bedside. Stool observed on pad as large amount of dark maroon blood with clots. Rectum without signs of external hemorrhoids, no evidence of thrombosed hemorrhoid. At this time, believe pt is likely having GI bleed. Stat CBC ordered. BP and HR stable.  Given bleeding and Na issue, now will TF pt to SDU. Discussed plan with son at bedside. Son denies that mother has hx of GIB, ulcers, or rectal bleeding in the past.  HOLD ASA 81, d/c Lovenox and start SCDs, and d/c Motrin.  KJKG, NP Update: Hgb back at 9, was over 12 on admission. Type and screen ordered. Consult GI in am. 3% saline being started in SDU. R/p labs due this am. Again, discussed plan with son.  KJKG, NP Update: Pt has had one large melana stool since transfer to SDU. Hgb stable at 9.4 this am. Still awaiting Na this am. HR slightly tachy at 102. BP stable at 140s.  Report given to  oncoming attending at 0700. KJKG, NP

## 2013-11-08 NOTE — Progress Notes (Signed)
Melanie, RN in ED notified that patient had coffee ground emesis prior to transport to 5W11. Patient states that she is currently without any nausea and does not feel as if she has to vomit. Will continue to monitor patient.

## 2013-11-08 NOTE — Progress Notes (Addendum)
CRITICAL VALUE ALERT  Critical value received:  119 Na+  Date of notification:  11/08/2013  Time of notification: 0152  Critical value read back:yes  Nurse who received alert:  Fritzie Prioleau, RN  MD notified (1st page):  Baltazar Najjar, NP   Time of first page:  0153  MD notified (2nd page):  Time of second page:  Responding MD:  Baltazar Najjar, np  Time MD responded:  0200 orders placed for am BMET

## 2013-11-08 NOTE — Progress Notes (Signed)
CRITICAL VALUE ALERT  Critical value received:  Na+ 120  Date of notification:  11/08/2013  Time of notification:  2344  Critical value read back:yes  Nurse who received alert:  Idaly Verret, RN  MD notified (1st page): Baltazar Najjar, NP  Time of first page:  2345  MD notified (2nd page):  Time of second page:  Responding MD: Baltazar Najjar, NP  Time MD responded:  2348, orders placed.

## 2013-11-08 NOTE — Progress Notes (Signed)
Peripherally Inserted Central Catheter/Midline Placement  The IV Nurse has discussed with the patient and/or persons authorized to consent for the patient, the purpose of this procedure and the potential benefits and risks involved with this procedure.  The benefits include less needle sticks, lab draws from the catheter and patient may be discharged home with the catheter.  Risks include, but not limited to, infection, bleeding, blood clot (thrombus formation), and puncture of an artery; nerve damage and irregular heat beat.  Alternatives to this procedure were also discussed.  Consent obtained from daughter in law due to patient being lethargic and confused.   PICC/Midline Placement Documentation        Cindy Mccormick Cindy Mccormick 11/08/2013, 6:29 PM

## 2013-11-08 NOTE — Progress Notes (Signed)
Patient stated that she had to urinate. Both RN and NT in room to assist patient. She needed a lot of cues, "step by step" as to how to get out of the bed and once she was on her feet, patient was extremely unsteady. Pt son also in room, states this is not at all patients baseline, who states that she is independent at home. Patient unable to pivot to bedside commode, even with 2 assist. Patient unable to start stream, it's as if she forgot she had to urinate. She had to be reminded, still unable to start. RN and NT assisted patient back to bed. RN bladder scanned patient which showed 258mL. Patient's son stated that in ED, RN had to I&O cath her in order to gain urine sample as patient had difficulty. Patient not expressing any discomfort or pain. States that she feels ok. Will continue to monitor patient.

## 2013-11-08 NOTE — Consult Note (Signed)
80 yo female highly functioning lives alone has h/o recurrrent lymphoma for over 10 years initially found by her urologist around one of her ureters (several recurrences and rounds of chemo being followed by [facility]), LBBB, htn, hld, cardiomyopathy last EF around 40% comes in with several days of worsening confusion noticed by her son. On admission sodium was 119 meq/l. Of note she takes Desmopressin PO for nocturnal enuresis per urology.  Upon admission she was placed on NS and Desmopressin was stopped today.  We are asked to assist due to worsening sodium, initially up to 122, then 118.  She vomited once PTA. No diarrhea.  She cannot provide a reliable history due to her mental status changes. She normally is very alert and with sharp memory per her daughter who is at the bedside.  Past Medical History  Diagnosis Date  . LEFT BUNDLE BRANCH BLOCK   . HYPERTENSION   . HYPERLIPIDEMIA   . CARDIOMYOPATHY   . LYMPHOMA   . DIVERTICULITIS, COLON   . Hemorrhoids, internal   . H/O: hysterectomy 1977  . Maintenance chemotherapy    Past Surgical History  Procedure Laterality Date  . Appendectomy  1949  . Toe surgery    . Lymphoma biopsies     Social History:  reports that she has quit smoking. She does not have any smokeless tobacco history on file. She reports that she does not drink alcohol. Her drug history is not on file. Allergies:  Allergies  Allergen Reactions  . Ezetimibe Other (See Comments)  . Hydrocodone-Acetaminophen Other (See Comments)  . Nitrofurantoin Other (See Comments)  . Other Itching and Rash    "chemo" medication given at [facility] in [place], [place]  . Oxycodone Hcl Other (See Comments)  . Sulfonamide Derivatives Other (See Comments)   Family History  Problem Relation Age of Onset  . Lymphoma Mother   . Sudden death Father   . Hypertension Neg Hx   . Hyperlipidemia Neg Hx   . Heart attack Neg Hx   . Diabetes Neg Hx     Medications:  Prior to Admission:   Prescriptions prior to admission  Medication Sig Dispense Refill  . aspirin EC 81 MG tablet Take 1 tablet (81 mg total) by mouth daily.  90 tablet  3  . carvedilol (COREG) 3.125 MG tablet Take 1 tablet (3.125 mg total) by mouth 2 (two) times daily with a meal.  180 tablet  4  . clonazePAM (KLONOPIN) 1 MG tablet Take 1 mg by mouth at bedtime as needed (for sleep).       Marland Kitchen desmopressin (DDAVP) 0.2 MG tablet Take 1 tablet by mouth at bedtime.       Marland Kitchen doxycycline (VIBRAMYCIN) 100 MG capsule Take 100 mg by mouth 2 (two) times daily.      Marland Kitchen ibuprofen (ADVIL,MOTRIN) 200 MG tablet Take 400 mg by mouth every 6 (six) hours as needed for moderate pain.      Marland Kitchen lisinopril (PRINIVIL,ZESTRIL) 10 MG tablet Take 10 mg by mouth daily.      . Multiple Vitamin (MULTIVITAMIN WITH MINERALS) TABS tablet Take 1 tablet by mouth daily.       ROS: not obtainable due to mental state  Blood pressure 112/73, pulse 79, temperature 98.4 F (36.9 C), temperature source Oral, resp. rate 20, height _0  (1.575 m), weight 54.8 kg (120 lb 13 oz), SpO2 94.00%.  General appearance: confused but pleasant Head: Normocephalic, without obvious abnormality, atraumatic Eyes: conjunctivae/corneas clear. PERRL, EOM's  intact. Fundi benign. Resp: clear to auscultation bilaterally Chest wall: no tenderness Cardio: regular rate and rhythm, S1, S2 normal, no murmur, click, rub or gallop GI: soft, non-tender; bowel sounds normal; no masses,  no organomegaly Extremities: extremities normal, atraumatic, no cyanosis or edema Skin: Skin color, texture, turgor normal. No rashes or lesions Neurologic: Grossly normal Results for orders placed during the hospital encounter of 11/07/13 (from the past 48 hour(s))  CBC WITH DIFFERENTIAL     Status: Abnormal   Collection Time    11/07/13  9:25 PM      Result Value Ref Range   WBC 9.3  4.0 - 10.5 K/uL   RBC 3.91  3.87 - 5.11 MIL/uL   Hemoglobin 12.5  12.0 - 15.0 g/dL   HCT 35.2 (*) 36.0 - 46.0 %    MCV 90.0  78.0 - 100.0 fL   MCH 32.0  26.0 - 34.0 pg   MCHC 35.5  30.0 - 36.0 g/dL   RDW 13.2  11.5 - 15.5 %   Platelets 188  150 - 400 K/uL   Neutrophils Relative % 82 (*) 43 - 77 %   Neutro Abs 7.7  1.7 - 7.7 K/uL   Lymphocytes Relative 14  12 - 46 %   Lymphs Abs 1.3  0.7 - 4.0 K/uL   Monocytes Relative 3  3 - 12 %   Monocytes Absolute 0.3  0.1 - 1.0 K/uL   Eosinophils Relative 1  0 - 5 %   Eosinophils Absolute 0.1  0.0 - 0.7 K/uL   Basophils Relative 0  0 - 1 %   Basophils Absolute 0.0  0.0 - 0.1 K/uL  BASIC METABOLIC PANEL     Status: Abnormal   Collection Time    11/07/13  9:25 PM      Result Value Ref Range   Sodium 122 (*) 137 - 147 mEq/L   Comment: REPEATED TO VERIFY   Potassium 4.0  3.7 - 5.3 mEq/L   Chloride 84 (*) 96 - 112 mEq/L   CO2 24  19 - 32 mEq/L   Glucose, Bld 113 (*) 70 - 99 mg/dL   BUN 33 (*) 6 - 23 mg/dL   Creatinine, Ser 1.16 (*) 0.50 - 1.10 mg/dL   Calcium 8.8  8.4 - 10.5 mg/dL   GFR calc non Af Amer 42 (*) >90 mL/min   GFR calc Af Amer 49 (*) >90 mL/min   Comment: (NOTE)     The eGFR has been calculated using the CKD EPI equation.     This calculation has not been validated in all clinical situations.     eGFR's persistently <90 mL/min signify possible Chronic Kidney     Disease.  Randolm Idol, ED     Status: None   Collection Time    11/07/13  9:34 PM      Result Value Ref Range   Troponin i, poc 0.00  0.00 - 0.08 ng/mL   Comment 3            Comment: Due to the release kinetics of cTnI,     a negative result within the first hours     of the onset of symptoms does not rule out     myocardial infarction with certainty.     If myocardial infarction is still suspected,     repeat the test at appropriate intervals.  I-STAT CG4 LACTIC ACID, ED     Status: None   Collection Time  11/07/13  9:36 PM      Result Value Ref Range   Lactic Acid, Venous 0.64  0.5 - 2.2 mmol/L  URINALYSIS, ROUTINE W REFLEX MICROSCOPIC     Status: Abnormal    Collection Time    11/07/13 10:01 PM      Result Value Ref Range   Color, Urine YELLOW  YELLOW   APPearance CLOUDY (*) CLEAR   Specific Gravity, Urine 1.021  1.005 - 1.030   pH 5.0  5.0 - 8.0   Glucose, UA NEGATIVE  NEGATIVE mg/dL   Hgb urine dipstick LARGE (*) NEGATIVE   Bilirubin Urine SMALL (*) NEGATIVE   Ketones, ur 15 (*) NEGATIVE mg/dL   Protein, ur 100 (*) NEGATIVE mg/dL   Urobilinogen, UA 0.2  0.0 - 1.0 mg/dL   Nitrite NEGATIVE  NEGATIVE   Leukocytes, UA NEGATIVE  NEGATIVE  URINE MICROSCOPIC-ADD ON     Status: Abnormal   Collection Time    11/07/13 10:01 PM      Result Value Ref Range   Squamous Epithelial / LPF RARE  RARE   RBC / HPF 7-10  <3 RBC/hpf   Bacteria, UA FEW (*) RARE   Casts GRANULAR CAST (*) NEGATIVE  LEGIONELLA ANTIGEN, URINE     Status: None   Collection Time    11/07/13 10:01 PM      Result Value Ref Range   Specimen Description URINE, RANDOM     Special Requests NONE     Legionella Antigen, Urine       Value: Negative for Legionella pneumophilia serogroup 1     Performed at [facility]   Report Status 11/08/2013 FINAL    STREP PNEUMONIAE URINARY ANTIGEN     Status: None   Collection Time    11/07/13 10:01 PM      Result Value Ref Range   Strep Pneumo Urinary Antigen NEGATIVE  NEGATIVE   Comment:            Infection due to S. pneumoniae     cannot be absolutely ruled out     since the antigen present     may be below the detection limit     of the test.  BASIC METABOLIC PANEL     Status: Abnormal   Collection Time    11/08/13  1:05 AM      Result Value Ref Range   Sodium 119 (*) 137 - 147 mEq/L   Comment: CRITICAL RESULT CALLED TO, READ BACK BY AND VERIFIED WITH:     THOMPSON A,RN 11/08/13 0152 WAYK   Potassium 4.1  3.7 - 5.3 mEq/L   Chloride 85 (*) 96 - 112 mEq/L   CO2 20  19 - 32 mEq/L   Glucose, Bld 186 (*) 70 - 99 mg/dL   BUN 35 (*) 6 - 23 mg/dL   Creatinine, Ser 1.19 (*) 0.50 - 1.10 mg/dL   Calcium 8.5  8.4 - 10.5 mg/dL    GFR calc non Af Amer 41 (*) >90 mL/min   GFR calc Af Amer 47 (*) >90 mL/min   Comment: (NOTE)     The eGFR has been calculated using the CKD EPI equation.     This calculation has not been validated in all clinical situations.     eGFR's persistently <90 mL/min signify possible Chronic Kidney     Disease.  CBC WITH DIFFERENTIAL     Status: Abnormal   Collection Time    11/08/13  1:05 AM  Result Value Ref Range   WBC 10.3  4.0 - 10.5 K/uL   RBC 3.82 (*) 3.87 - 5.11 MIL/uL   Hemoglobin 12.2  12.0 - 15.0 g/dL   HCT 34.9 (*) 36.0 - 46.0 %   MCV 91.4  78.0 - 100.0 fL   MCH 31.9  26.0 - 34.0 pg   MCHC 35.0  30.0 - 36.0 g/dL   RDW 13.4  11.5 - 15.5 %   Platelets 161  150 - 400 K/uL   Neutrophils Relative % 85 (*) 43 - 77 %   Neutro Abs 8.8 (*) 1.7 - 7.7 K/uL   Lymphocytes Relative 10 (*) 12 - 46 %   Lymphs Abs 1.0  0.7 - 4.0 K/uL   Monocytes Relative 4  3 - 12 %   Monocytes Absolute 0.4  0.1 - 1.0 K/uL   Eosinophils Relative 1  0 - 5 %   Eosinophils Absolute 0.1  0.0 - 0.7 K/uL   Basophils Relative 0  0 - 1 %   Basophils Absolute 0.0  0.0 - 0.1 K/uL  BASIC METABOLIC PANEL     Status: Abnormal   Collection Time    11/08/13  8:15 AM      Result Value Ref Range   Sodium 122 (*) 137 - 147 mEq/L   Potassium 4.2  3.7 - 5.3 mEq/L   Chloride 87 (*) 96 - 112 mEq/L   CO2 19  19 - 32 mEq/L   Glucose, Bld 90  70 - 99 mg/dL   BUN 36 (*) 6 - 23 mg/dL   Creatinine, Ser 1.26 (*) 0.50 - 1.10 mg/dL   Calcium 8.3 (*) 8.4 - 10.5 mg/dL   GFR calc non Af Amer 38 (*) >90 mL/min   GFR calc Af Amer 44 (*) >90 mL/min   Comment: (NOTE)     The eGFR has been calculated using the CKD EPI equation.     This calculation has not been validated in all clinical situations.     eGFR's persistently <90 mL/min signify possible Chronic Kidney     Disease.  OSMOLALITY     Status: Abnormal   Collection Time    11/08/13  8:15 AM      Result Value Ref Range   Osmolality 259 (*) 275 - 300 mOsm/kg    Comment: Performed at [facility]     Status: Abnormal   Collection Time    11/08/13  1:30 PM      Result Value Ref Range   Sodium 118 (*) 137 - 147 mEq/L   Comment: CRITICAL RESULT CALLED TO, READ BACK BY AND VERIFIED WITH:     CRYSTAL MEFFORD,RN AT 1444 11/08/13 BY ZBEECH.   Potassium 4.3  3.7 - 5.3 mEq/L   Chloride 89 (*) 96 - 112 mEq/L   CO2 21  19 - 32 mEq/L   Glucose, Bld 94  70 - 99 mg/dL   BUN 37 (*) 6 - 23 mg/dL   Creatinine, Ser 1.29 (*) 0.50 - 1.10 mg/dL   Calcium 8.0 (*) 8.4 - 10.5 mg/dL   GFR calc non Af Amer 37 (*) >90 mL/min   GFR calc Af Amer 43 (*) >90 mL/min   Comment: (NOTE)     The eGFR has been calculated using the CKD EPI equation.     This calculation has not been validated in all clinical situations.     eGFR's persistently <90 mL/min signify possible Chronic Kidney  Disease.   Ct Head Wo Contrast  11/07/2013   CLINICAL DATA:  Severe headache.  EXAM: CT HEAD WITHOUT CONTRAST  TECHNIQUE: Contiguous axial images were obtained from the base of the skull through the vertex without intravenous contrast.  COMPARISON:  None.  FINDINGS: Skull and Sinuses:No acute osseous findings. Clear paranasal sinuses.  Orbits: Unremarkable for age.  Brain: No evidence of acute abnormality, such as acute infarction, hemorrhage, hydrocephalus, or mass lesion/mass effect. Enlarged extra-axial spaces in the bilateral frontal and parietal region is related to atrophy given no mass effect to suggest hygroma. Low-attenuation area along the lower left sylvian fissure is most consistent with dilated perivascular space.  IMPRESSION: No acute intracranial abnormality.   Electronically Signed   By: Jorje Guild M.D.   On: 11/07/2013 23:04   Dg Chest Port 1 View  11/07/2013   CLINICAL DATA:  Patient is confused, diagnosed with UTI yesterday  EXAM: PORTABLE CHEST - 1 VIEW  COMPARISON:  CT CHEST-ABD-PELV W/ CM dated 09/08/2013  FINDINGS: There is left lower lobe  airspace disease. There is no pleural effusion or pneumothorax. Stable cardiomediastinal silhouette. Thoracic aortic atherosclerosis. Unremarkable osseous structures.  IMPRESSION: Left lower lobe airspace disease which may reflect atelectasis versus is infiltrate.   Electronically Signed   By: Kathreen Devoid   On: 11/07/2013 22:34    Assessment: 1 Hyponatremia, drug induced & symptomatic (due to ADH) 2 Recurrent Lymphoma 3 Urinary retention  Plan: 1 Furosemide 2 3% saline 3 Limit fluid PO 4 Serial labs 5 foley  Estanislado Emms, MD 11/08/2013, 5:04 PM

## 2013-11-08 NOTE — Progress Notes (Signed)
CRITICAL VALUE ALERT  Critical value received:  Na 118  Date of notification:  11/08/13  Time of notification:  9326  Critical value read back:yes  Nurse who received alert:  Rip Harbour  MD notified (1st page):  Ghimire  Time of first page:  2  MD notified (2nd page):  Time of second page:  Responding MD:  Patria Mane, PA  Time MD responded:  [phone]

## 2013-11-08 NOTE — Progress Notes (Signed)
Pt son Kris Mouton stated that pt has Health care power of attorney and living will and will try to bring copy for patient chart.

## 2013-11-08 NOTE — Progress Notes (Signed)
Normal saline 3% order discontinued as Therapist, sports giving report to [place], Therapist, sports on 3S. Baltazar Najjar, NP called to d/c transfer order. Pt has sodium draw at 11pm and kirby , np states we will direct pt care if needed. RN notified [place],RN in 3S of transfer d/c. Patient not in any distress. Will continue to monitor patient.

## 2013-11-08 NOTE — Progress Notes (Signed)
Utilization review completed.  

## 2013-11-08 NOTE — ED Notes (Signed)
Pt vomited dark brown coffee ground emesis prior to being transported upstairs to room. Dr. Shanon Brow paged and April RN on 5West notified. Emesis shown to Dr. Jeneen Rinks, Dr. Roxanne Mins, and Izell Oceana, [place].

## 2013-11-08 NOTE — Progress Notes (Signed)
Patient arrived to unit 5W11 by stretcher. Son, Cindy Mccormick, at bedside with patient belongings and patient meds. He stated that he will take patient medications home with him. Patient is arousable but oriented x4 with delayed responses. Per son, this is not patient's baseline. He states that patient lives home alone, does not use any assistive devices, drives, and does mainly everything for herself. Son states that he lives in [place]. Skin intact, IV intact. Patient's son instructed on how to use call bell and get into contact with nursing staff since he will be staying the night with patient, instructed him on how to call using room phone. Patient's son able to demonstrate and understand. Bed alarm placed on patient bed. Both son and patient advised to call staff before trying to get out of bed. Son states that he just wants his mom to rest, refused safety video at this time. Call bell within reach. Will continue to monitor per MD orders.

## 2013-11-08 NOTE — Progress Notes (Signed)
RN educated patient's son about SCDs and lovenox for VTE prophylaxis use. Son stated that he felt the SCDs would prevent pt from sleeping well tonight, and would reconsider need in the morning. He does understand the use of lovenox for patient and is agreeable.

## 2013-11-09 DIAGNOSIS — D62 Acute posthemorrhagic anemia: Secondary | ICD-10-CM

## 2013-11-09 DIAGNOSIS — R531 Weakness: Secondary | ICD-10-CM | POA: Diagnosis not present

## 2013-11-09 DIAGNOSIS — K625 Hemorrhage of anus and rectum: Secondary | ICD-10-CM

## 2013-11-09 DIAGNOSIS — E878 Other disorders of electrolyte and fluid balance, not elsewhere classified: Secondary | ICD-10-CM

## 2013-11-09 DIAGNOSIS — I959 Hypotension, unspecified: Secondary | ICD-10-CM | POA: Diagnosis present

## 2013-11-09 LAB — CBC
HEMATOCRIT: 17.5 % — AB (ref 36.0–46.0)
HEMATOCRIT: 25.1 % — AB (ref 36.0–46.0)
HEMATOCRIT: 26.6 % — AB (ref 36.0–46.0)
HEMOGLOBIN: 9 g/dL — AB (ref 12.0–15.0)
Hemoglobin: 9.4 g/dL — ABNORMAL LOW (ref 12.0–15.0)
Hemoglobin: 6.2 g/dL — CL (ref 12.0–15.0)
MCH: 31.1 pg (ref 26.0–34.0)
MCH: 31.3 pg (ref 26.0–34.0)
MCH: 31.7 pg (ref 26.0–34.0)
MCHC: 35.9 g/dL (ref 30.0–36.0)
MCHC: 35.3 g/dL (ref 30.0–36.0)
MCHC: 35.4 g/dL (ref 30.0–36.0)
MCV: 88.4 fL (ref 78.0–100.0)
MCV: 88.1 fL (ref 78.0–100.0)
MCV: 88.4 fL (ref 78.0–100.0)
PLATELETS: 138 10*3/uL — AB (ref 150–400)
PLATELETS: 102 10*3/uL — AB (ref 150–400)
Platelets: 120 10*3/uL — ABNORMAL LOW (ref 150–400)
RBC: 2.84 MIL/uL — ABNORMAL LOW (ref 3.87–5.11)
RBC: 3.02 MIL/uL — ABNORMAL LOW (ref 3.87–5.11)
RBC: 1.98 MIL/uL — ABNORMAL LOW (ref 3.87–5.11)
RDW: 13.1 % (ref 11.5–15.5)
RDW: 13.3 % (ref 11.5–15.5)
RDW: 13.5 % (ref 11.5–15.5)
WBC: 9.3 10*3/uL (ref 4.0–10.5)
WBC: 11.6 10*3/uL — AB (ref 4.0–10.5)
WBC: 8.9 10*3/uL (ref 4.0–10.5)

## 2013-11-09 LAB — BASIC METABOLIC PANEL
BUN: 48 mg/dL — ABNORMAL HIGH (ref 6–23)
BUN: 48 mg/dL — ABNORMAL HIGH (ref 6–23)
CHLORIDE: 93 meq/L — AB (ref 96–112)
CHLORIDE: 98 meq/L (ref 96–112)
CO2: 20 mEq/L (ref 19–32)
CO2: 19 meq/L (ref 19–32)
Calcium: 7.4 mg/dL — ABNORMAL LOW (ref 8.4–10.5)
Calcium: 6.5 mg/dL — ABNORMAL LOW (ref 8.4–10.5)
Creatinine, Ser: 1.68 mg/dL — ABNORMAL HIGH (ref 0.50–1.10)
Creatinine, Ser: 1.66 mg/dL — ABNORMAL HIGH (ref 0.50–1.10)
GFR calc non Af Amer: 27 mL/min — ABNORMAL LOW (ref >90)
GFR, EST AFRICAN AMERICAN: 31 mL/min — AB (ref >90)
GFR, EST AFRICAN AMERICAN: 32 mL/min — AB (ref >90)
GFR, EST NON AFRICAN AMERICAN: 27 mL/min — AB (ref >90)
Glucose, Bld: 103 mg/dL — ABNORMAL HIGH (ref 70–99)
Glucose, Bld: 105 mg/dL — ABNORMAL HIGH (ref 70–99)
POTASSIUM: 4.5 meq/L (ref 3.7–5.3)
POTASSIUM: 4.4 meq/L (ref 3.7–5.3)
SODIUM: 126 meq/L — AB (ref 137–147)
SODIUM: 128 meq/L — AB (ref 137–147)

## 2013-11-09 LAB — ABO/RH: ABO/RH(D): O POS

## 2013-11-09 LAB — CORTISOL: CORTISOL PLASMA: 25.3 ug/dL

## 2013-11-09 LAB — PREPARE RBC (CROSSMATCH)

## 2013-11-09 LAB — OSMOLALITY, URINE: Osmolality, Ur: 316 mOsm/kg — ABNORMAL LOW (ref 390–1090)

## 2013-11-09 MED ORDER — SODIUM CHLORIDE 0.9 % IV SOLN
INTRAVENOUS | Status: DC
  Administered 2013-11-09: 1000 mL via INTRAVENOUS
  Administered 2013-11-10: 999 mL via INTRAVENOUS
  Administered 2013-11-10 – 2013-11-11 (×2): via INTRAVENOUS

## 2013-11-09 MED ORDER — SODIUM CHLORIDE 3 % IV SOLN
INTRAVENOUS | Status: DC
  Administered 2013-11-09: 04:00:00 via INTRAVENOUS
  Filled 2013-11-09 (×3): qty 500

## 2013-11-09 MED ORDER — PANTOPRAZOLE SODIUM 40 MG IV SOLR: 40.0000 mg | INTRAVENOUS | Status: DC

## 2013-11-09 MED ORDER — SODIUM CHLORIDE 0.9 % IV BOLUS (SEPSIS): 500.0000 mL | Freq: Once | INTRAVENOUS | Status: DC

## 2013-11-09 MED ORDER — PANTOPRAZOLE SODIUM 40 MG IV SOLR
40.0000 mg | Freq: Two times a day (BID) | INTRAVENOUS | Status: DC
  Administered 2013-11-10 – 2013-11-11 (×4): 40 mg via INTRAVENOUS
  Filled 2013-11-09 (×6): qty 40

## 2013-11-09 MED ORDER — SODIUM CHLORIDE 0.9 % IV BOLUS (SEPSIS)
1000.0000 mL | Freq: Once | INTRAVENOUS | Status: AC
  Administered 2013-11-09: 1000 mL via INTRAVENOUS

## 2013-11-09 NOTE — Consult Note (Signed)
Patient seen, examined, and I agree with the above documentation, including the assessment and plan. Pt admitted with AMS, low Na, and AKI Now with what appears to be a lower GI bleed (bright red with clots, some mixed with formed brown stools).  No melena to this point. No further BMs this pm, small smear of blood per nursing, but no hematochezia, melena or stool. Hgb has significantly decreased and most recent is 6.2.  Will transfuse 2u pRBC now.  I have spoken with son at length by phone. He is aware of the transfusion and voices no concern in proceeding with blood transfusion Agree with empiric PPI Will hold off now on upper and lower endoscopy, neurology eval ongoing with planned MRI.  If bleeding becomes hemodynamically significant or additional evidence to suggest upper GI source, then would proceed to EGD

## 2013-11-09 NOTE — Progress Notes (Signed)
Patient's son fletcher provided pt living will and health care power of attorney documentation. Copy placed into pt chart. originial given back to pt son.

## 2013-11-09 NOTE — Progress Notes (Signed)
Dr. Hilarie Fredrickson in and aware of hgb 6.2 blood ordered.

## 2013-11-09 NOTE — Progress Notes (Signed)
Report called to Otila Kluver, Therapist, sports. Pt to be transferred to 2C03.

## 2013-11-09 NOTE — Progress Notes (Signed)
Assessment:  1 Hyponatremia, drug induced & symptomatic (due to ADH)  2 Recurrent Lymphoma  3 Urinary retention  Plan:  1 Stop 3% saline  2 Limit fluid PO  3 foley 4 Serial labs and accurate UOP  Subjective: Interval History: Transferred to 2c.  ???UOP  Objective: Vital signs in last 24 hours: Temp:  [96.4 F (35.8 C)-98.4 F (36.9 C)] 97.4 F (36.3 C) (05/07 0746) Pulse Rate:  [72-108] 108 (05/07 0746) Resp:  [16-22] 20 (05/07 0746) BP: (90-162)/(60-82) 90/68 mmHg (05/07 0746) SpO2:  [94 %-100 %] 98 % (05/07 0746) Weight:  [56.9 kg (125 lb 7.1 oz)] 56.9 kg (125 lb 7.1 oz) (05/07 0401) Weight change: 2.1 kg (4 lb 10.1 oz)  Intake/Output from previous day: 05/06 0701 - 05/07 0700 In: 248.3 [P.O.:120; I.V.:128.3] Out: -  Intake/Output this shift: Total I/O In: -  Out: 50 [Urine:50]  Neurologic: Mental status: Alert, oriented, thought content appropriate, difficulty completing a thought, cannot answer simple questions, looks and follows Lungs clear Cor RRR Abd soft Ext No edema  Lab Results:  Recent Labs  11/09/13 0003 11/09/13 0312  WBC 9.3 11.6*  HGB 9.0* 9.4*  HCT 25.1* 26.6*  PLT 120* 138*   BMET:  Recent Labs  11/08/13 1955 11/08/13 2255  NA 123* 120*  K 4.4 4.5  CL 91* 90*  CO2 22 21  GLUCOSE 97 97  BUN 42* 44*  CREATININE 1.46* 1.51*  CALCIUM 7.8* 7.7*   No results found for this basename: PTH,  in the last 72 hours Iron Studies: No results found for this basename: IRON, TIBC, TRANSFERRIN, FERRITIN,  in the last 72 hours Studies/Results: Ct Head Wo Contrast  11/07/2013   CLINICAL DATA:  Severe headache.  EXAM: CT HEAD WITHOUT CONTRAST  TECHNIQUE: Contiguous axial images were obtained from the base of the skull through the vertex without intravenous contrast.  COMPARISON:  None.  FINDINGS: Skull and Sinuses:No acute osseous findings. Clear paranasal sinuses.  Orbits: Unremarkable for age.  Brain: No evidence of acute abnormality, such as acute  infarction, hemorrhage, hydrocephalus, or mass lesion/mass effect. Enlarged extra-axial spaces in the bilateral frontal and parietal region is related to atrophy given no mass effect to suggest hygroma. Low-attenuation area along the lower left sylvian fissure is most consistent with dilated perivascular space.  IMPRESSION: No acute intracranial abnormality.   Electronically Signed   By: Jorje Guild M.D.   On: 11/07/2013 23:04   Dg Chest Port 1 View  11/07/2013   CLINICAL DATA:  Patient is confused, diagnosed with UTI yesterday  EXAM: PORTABLE CHEST - 1 VIEW  COMPARISON:  CT CHEST-ABD-PELV W/ CM dated 09/08/2013  FINDINGS: There is left lower lobe airspace disease. There is no pleural effusion or pneumothorax. Stable cardiomediastinal silhouette. Thoracic aortic atherosclerosis. Unremarkable osseous structures.  IMPRESSION: Left lower lobe airspace disease which may reflect atelectasis versus is infiltrate.   Electronically Signed   By: Kathreen Devoid   On: 11/07/2013 22:34   Scheduled: . azithromycin  500 mg Intravenous Q24H  . carvedilol  3.125 mg Oral BID WC  . cefTRIAXone (ROCEPHIN)  IV  1 g Intravenous Q24H  . furosemide  40 mg Intravenous Once  . hydrocortisone  25 mg Rectal BID  . lisinopril  10 mg Oral Daily  . multivitamin with minerals  1 tablet Oral Daily  . sodium chloride  10-40 mL Intracatheter Q12H     LOS: 2 days   Estanislado Emms 11/09/2013,8:23 AM

## 2013-11-09 NOTE — Consult Note (Signed)
Referring Provider: No ref. provider found Primary Care Physician:  Laurel Dimmer, MD Primary Gastroenterologist:  Dr. Deatra Ina  Reason for Consultation:  GIB; passing clots per rectum  HPI: Cindy Mccormick is a 80 y.o. female who is apparently highly functioning and lives alone, but has h/o recurrrent lymphoma for over 10 years initially found by her urologist around one of her ureters (several recurrences and rounds of chemo being followed by [facility]), LBBB, HTN, HLD, cardiomyopathy last EF around 40%.  She presented with several days of worsening confusion noticed by her son. About 2 weeks ago she started having lower back pain, went to see her orthopedic doctor and was put on a round of prednisone. Son says her back pain resolved and she felt wonderful on the prednisone. Finished that last week. A couple days after finishing that she started with pain lower in her back and towards the side (he does not remember which side). She started to get confused, just mildly on Sunday night (5/3). He took her to see her urologist who diagnosed her with a uti and placed her on doxycycline. She took 2 doses of that, but got much worse over the night. She not been eating or drinking well. He called EMS on 5/5 when he went to check on her, and she was much worse than when he checked on her 12 hours earlier.  She normally is very alert and with sharp memory.  She was found to have hyponatremia with sodium of 122 and AKI upon presentation to the ED.  Has been followed by nephrology.  Last night she started passing blood per rectum with six occurences between 7 pm and 12 am.  Blood was described as clotted and dark red in large amounts at times.  Some of them were accompanied by formed stool.  Her son was present at the time of my visit today and provided most of the information.  Nursing reports that she has not moved her bowels at all after 7 am today and only had one BM apparently since being moved to this SD unit  after midnight.    No other known issues with GIB in the past.  Hgb had been in the 12 gram range since admission, but is down to 9.4 grams this AM.  She does not complain of abdominal pain, but she will not answer questions for me during my visit and just stares at me blankly.  Takes ASA 81 mg at home daily and NSAID's are listed prn, but unsure of the frequency of use.   Last colonoscopy was in 03/2006 by Dr. Deatra Ina at which time she had diverticulosis and internal hemorrhoids.   Past Medical History  Diagnosis Date  . LEFT BUNDLE BRANCH BLOCK   . HYPERTENSION   . HYPERLIPIDEMIA   . CARDIOMYOPATHY   . LYMPHOMA   . DIVERTICULITIS, COLON   . Hemorrhoids, internal   . H/O: hysterectomy 1977  . Maintenance chemotherapy     Past Surgical History  Procedure Laterality Date  . Appendectomy  1949  . Toe surgery    . Lymphoma biopsies      Prior to Admission medications   Medication Sig Start Date End Date Taking? Authorizing Provider  aspirin EC 81 MG tablet Take 1 tablet (81 mg total) by mouth daily. 03/23/13  Yes Lelon Perla, MD  carvedilol (COREG) 3.125 MG tablet Take 1 tablet (3.125 mg total) by mouth 2 (two) times daily with a meal. 03/03/13  Yes Aaron Edelman  Jacalyn Lefevre, MD  clonazePAM (KLONOPIN) 1 MG tablet Take 1 mg by mouth at bedtime as needed (for sleep).  10/16/13  Yes Historical Provider, MD  desmopressin (DDAVP) 0.2 MG tablet Take 1 tablet by mouth at bedtime.  02/19/11  Yes Historical Provider, MD  doxycycline (VIBRAMYCIN) 100 MG capsule Take 100 mg by mouth 2 (two) times daily.   Yes Historical Provider, MD  ibuprofen (ADVIL,MOTRIN) 200 MG tablet Take 400 mg by mouth every 6 (six) hours as needed for moderate pain.   Yes Historical Provider, MD  lisinopril (PRINIVIL,ZESTRIL) 10 MG tablet Take 10 mg by mouth daily.   Yes Historical Provider, MD  Multiple Vitamin (MULTIVITAMIN WITH MINERALS) TABS tablet Take 1 tablet by mouth daily.   Yes Historical Provider, MD    Current  Facility-Administered Medications  Medication Dose Route Frequency Provider Last Rate Last Dose  . azithromycin (ZITHROMAX) 500 mg in dextrose 5 % 250 mL IVPB  500 mg Intravenous Q24H Phillips Grout, MD   500 mg at 11/08/13 2329  . carvedilol (COREG) tablet 3.125 mg  3.125 mg Oral BID WC Phillips Grout, MD   3.125 mg at 11/09/13 0900  . cefTRIAXone (ROCEPHIN) 1 g in dextrose 5 % 50 mL IVPB  1 g Intravenous Q24H Phillips Grout, MD   1 g at 11/08/13 2249  . clonazePAM (KLONOPIN) tablet 1 mg  1 mg Oral QHS PRN Phillips Grout, MD      . furosemide (LASIX) injection 40 mg  40 mg Intravenous Once Shanker Kristeen Mans, MD      . hydrocortisone (ANUSOL-HC) suppository 25 mg  25 mg Rectal BID Jonetta Osgood, MD   25 mg at 11/08/13 2110  . lisinopril (PRINIVIL,ZESTRIL) tablet 10 mg  10 mg Oral Daily Phillips Grout, MD   10 mg at 11/08/13 1127  . multivitamin with minerals tablet 1 tablet  1 tablet Oral Daily Phillips Grout, MD   1 tablet at 11/08/13 1127  . sodium chloride 0.9 % injection 10-40 mL  10-40 mL Intracatheter Q12H Jonetta Osgood, MD   10 mL at 11/09/13 0135  . sodium chloride 0.9 % injection 10-40 mL  10-40 mL Intracatheter PRN Jonetta Osgood, MD   10 mL at 11/09/13 0050    Allergies as of 11/07/2013 - Review Complete 11/07/2013  Allergen Reaction Noted  . Ezetimibe Other (See Comments)   . Hydrocodone-acetaminophen Other (See Comments)   . Nitrofurantoin Other (See Comments)   . Other Itching and Rash 11/07/2013  . Oxycodone hcl Other (See Comments)   . Sulfonamide derivatives Other (See Comments)     Family History  Problem Relation Age of Onset  . Lymphoma Mother   . Sudden death Father   . Hypertension Neg Hx   . Hyperlipidemia Neg Hx   . Heart attack Neg Hx   . Diabetes Neg Hx     History   Social History  . Marital Status: Widowed    Spouse Name: N/A    Number of Children: N/A  . Years of Education: N/A   Occupational History  . retired Other   Social  History Main Topics  . Smoking status: Former Research scientist (life sciences)  . Smokeless tobacco: Not on file  . Alcohol Use: No  . Drug Use: Not on file  . Sexual Activity: Not on file   Other Topics Concern  . Not on file   Social History Narrative  . No narrative on file  Review of Systems: Ten point ROS is O/W negative except as mentioned in HPI.  Physical Exam: Vital signs in last 24 hours: Temp:  [96.4 F (35.8 C)-98.4 F (36.9 C)] 97.4 F (36.3 C) (05/07 0746) Pulse Rate:  [79-108] 103 (05/07 0900) Resp:  [16-22] 20 (05/07 0746) BP: (90-162)/(59-82) 112/59 mmHg (05/07 0900) SpO2:  [94 %-100 %] 98 % (05/07 0746) Weight:  [125 lb 7.1 oz (56.9 kg)] 125 lb 7.1 oz (56.9 kg) (05/07 0401) Last BM Date: 11/09/13 General:   Alert, Well-developed, well-nourished, in NAD Head:  Normocephalic and atraumatic. Eyes:  Sclera clear, no icterus.  Conjunctiva pink. Ears:  Normal auditory acuity. Mouth:  No deformity or lesions.   Lungs:  Clear throughout to auscultation.  No wheezes, crackles, or rhonchi.  Heart:  Sinus tach; no murmurs, clicks, rubs, or gallops. Abdomen:  Soft, non-distended.  BS present.  Non-tender.   Msk:  Symmetrical without gross deformities. Pulses:  Normal pulses noted. Extremities:  Without clubbing or edema. Neurologic:  Alert, but would not answer questions and just stared blankly. Skin:  Intact without significant lesions or rashes.  Intake/Output from previous day: 05/06 0701 - 05/07 0700 In: 248.3 [P.O.:120; I.V.:128.3] Out: -  Intake/Output this shift: Total I/O In: -  Out: 50 [Urine:50]  Lab Results:  Recent Labs  11/08/13 0105 11/09/13 0003 11/09/13 0312  WBC 10.3 9.3 11.6*  HGB 12.2 9.0* 9.4*  HCT 34.9* 25.1* 26.6*  PLT 161 120* 138*   BMET  Recent Labs  11/08/13 1955 11/08/13 2255 11/09/13 0835  NA 123* 120* 126*  K 4.4 4.5 4.5  CL 91* 90* 93*  CO2 22 21 20   GLUCOSE 97 97 103*  BUN 42* 44* 48*  CREATININE 1.46* 1.51* 1.68*  CALCIUM  7.8* 7.7* 7.4*   Studies/Results: Ct Head Wo Contrast  11/07/2013   CLINICAL DATA:  Severe headache.  EXAM: CT HEAD WITHOUT CONTRAST  TECHNIQUE: Contiguous axial images were obtained from the base of the skull through the vertex without intravenous contrast.  COMPARISON:  None.  FINDINGS: Skull and Sinuses:No acute osseous findings. Clear paranasal sinuses.  Orbits: Unremarkable for age.  Brain: No evidence of acute abnormality, such as acute infarction, hemorrhage, hydrocephalus, or mass lesion/mass effect. Enlarged extra-axial spaces in the bilateral frontal and parietal region is related to atrophy given no mass effect to suggest hygroma. Low-attenuation area along the lower left sylvian fissure is most consistent with dilated perivascular space.  IMPRESSION: No acute intracranial abnormality.   Electronically Signed   By: Jorje Guild M.D.   On: 11/07/2013 23:04   Dg Chest Port 1 View  11/07/2013   CLINICAL DATA:  Patient is confused, diagnosed with UTI yesterday  EXAM: PORTABLE CHEST - 1 VIEW  COMPARISON:  CT CHEST-ABD-PELV W/ CM dated 09/08/2013  FINDINGS: There is left lower lobe airspace disease. There is no pleural effusion or pneumothorax. Stable cardiomediastinal silhouette. Thoracic aortic atherosclerosis. Unremarkable osseous structures.  IMPRESSION: Left lower lobe airspace disease which may reflect atelectasis versus is infiltrate.   Electronically Signed   By: Kathreen Devoid   On: 11/07/2013 22:34    IMPRESSION:  -GI Bleed:  LGI sources include diverticular or possibly ischemic colitis secondary to dehydration/hypotension.  ? If this could be an UGI source as well.  She had taken a course of prednisone recently and takes ASA 81 mg daily along with NSAID's prn (? Unsure of recent NSAID use, however). -Hyponatremia:  Drug induced, dehydration, and secondary to ADH.  Renal following. -UTI:  On antibiotics. -AMS:  Thought to be secondary to UTI and hyponatremia.   PLAN: -Ok with clear  liquids for now. -Will place on pantoprazole 40 mg IV daily empirically for now. -If bleeding worsens again then may need NM bleeding scan. -Likely will need endoscopic evaluation prior to discharge once sodium levels and other acute issues improved.   Laban Emperor. Jeanita Carneiro  11/09/2013, 10:46 AM  Pager number [phone]

## 2013-11-09 NOTE — Progress Notes (Signed)
Patient has had 6 occurrences of blood coming from her rectum since 7pm, with the last two accompanied by formed stool. Each seemed to become larger, blood is clotted and dark red. Patient's son Kris Mouton just called RN to notify that patient had another bleeding episode. RN just assessed pt bottom and cleaned up patient prior to son's call 30 minutes before. Baltazar Najjar, NP notified of bleed from patient rectum along with critical sodium value. NP at patient bedside, orders received to transfer patient.

## 2013-11-09 NOTE — Progress Notes (Signed)
[facility]  Cindy Mccormick VOP:[id number] DOB: April 25, 1928 DOA: 11/07/2013 PCP: Laurel Dimmer, MD  Time spent :  Brief narrative: 80 yo WF PMHx highly functional lives alone has h/o recurrrent lymphoma for over 10 years initially found by her urologist around one of her ureters (several recurrences and rounds of chemo being followed by [facility]), LBBB, htn, hld, cardiomyopathy last EF around 40% comes in with several days of worsening confusion noticed by her son. About 2 weeks ago she started having lower back pain, went to see her orthopedic doctor and was put on a round of prednisone. Son says her back pain resolved and she felt wonderful on the prednisone. Finished that last week. A couple days after the finishing that she started with pain lower in her back and towards the side (he does not remember which side). She started to get confused, just mildly on Sunday night (2 night ago). He took her to see her urologist yesterday who diagnosed her with a uti and placed her on doxycycline. She took 2 doses of that, but got much worse over the night. She has not been eating or drinking hardly. He called ems today when he went to check on her, and she was much worse than when he checked on her 12 hours earlier. No cough. She did vomit once today. No diarrhea. She complains of no pain, she cannot provide a reliable history due to her mental status changes. She normally is very alert and with sharp memory  HPI/Subjective: Aphasic today but awake, makes eye contact- per son this is not her baseline  Assessment/Plan:   Hypotension -seems primarily due to dehydration and anti-HTN meds -did have Prednisone taper pre admit for "back pain" so check cortisol -hold Coreg and Lisinopril -bolus 1 L and begin IVFs at 100/hr since not eating -Lasix dc'd    Altered mental status/unilateral weakness -felt to be 2/2 hyponatremia initially -today MD noted right side  weakness and appears to be aphasic which is new since admission so concerned over CVA- check MRI -clear liquids but if unable to safely take PO then NPO and consult SLP    Hyponatremia -was on desmopressin pre admit- ?? For DI??- at this time has marginal UOP -Renal consulted and gave 3% NS for several hours -Na has risen from 120 to 123 range to 126 so renal has stopped -dc Lasix as above -serum osmo 259, urine Osmo 316, urine sodium less than 20-this is either indicative of hypovolemia or salt wasting in setting of renal disease and or use of diuretics -follow BMET q 6 hrs    Acute Rectal bleeding -onset last pm -has stopped for now -GI consulted   ABL anemia/acute thrombocytopenia -baseline Hgb unclear- presented with Hgb 12 but ?? If was dry at that time -follow -given hypotension if Hgb drops further rec tx (8.5) -CBC q 12 hrs -low platelets probably due to acute GIB - follow- if persists check LDH and haptoglobin to r/o hemolytic process-was NOT on Lasix pre admit and Lasix can cause thrombocytopenia  247 pm:**Repeat CBC hgb now down to 6.9 so will transfuse 2 units PRBCs and hold maintenance fluids during transfusion    Acute renal failure/dehydration -baseline 35/1.19 and today up to 48/1.68-may have been dry at admit since "baseline" obtained at admit or elevated BUN reflective of GIB -was on ACE I so ? degree of ATN -IVF hydration    ?? UTI (lower urinary  tract infection) -dx'd pre admit and was given doxy by PCP -cx negative    ?? CAP (community acquired pneumonia) -not hypoxic -CXR LLL likely atelectasis and not infection so likely can stop anbx's    history of LYMPHOMA -followed at [facility] -dx'd 10 yrs prior    HYPERLIPIDEMIA    HYPERTENSION -BP low so usual meds on hold    Chronic systolic heart failure -EF 40-45% per ECHO 2014 -diuretics and ACE I on hold due to ARF and dehydration/hypotension -compensated    PERIPHERAL VASCULAR DISEASE   DVT  prophylaxis: SCDs Code Status: Full Family Communication: Son at bedside Disposition Plan/Expected LOS: Stepdown   Consultants: Nephrology Gastroenterology  Procedures: None  Antibiotics: Rocephin 5/5 >>> Zithromax 5/5 >>  Objective: Blood pressure 111/52, pulse 85, temperature 97.6 F (36.4 C), temperature source Oral, resp. rate 20, height 5\' 2"  (1.575 m), weight 125 lb 7.1 oz (56.9 kg), SpO2 98.00%.  Intake/Output Summary (Last 24 hours) at 11/09/13 1244 Last data filed at 11/09/13 0800  Gross per 24 hour  Intake 128.33 ml  Output     50 ml  Net  78.33 ml     Exam: General: No acute respiratory distress Lungs: Clear to auscultation bilaterally without wheezes or crackles, RA Cardiovascular: Regular rate and rhythm without murmur gallop or rub normal S1 and S2, no peripheral edema or JVD Abdomen: Nontender, nondistended, soft, bowel sounds positive, no rebound, no ascites, no appreciable mass Musculoskeletal: No significant cyanosis, clubbing of bilateral lower extremities Neurological: Awake and nonverbal, did not follow commands, when assessed appear to be weaker on the right side, no apparent facial droop   Scheduled Meds:  Scheduled Meds: . azithromycin  500 mg Intravenous Q24H  . cefTRIAXone (ROCEPHIN)  IV  1 g Intravenous Q24H  . hydrocortisone  25 mg Rectal BID  . multivitamin with minerals  1 tablet Oral Daily  . pantoprazole (PROTONIX) IV  40 mg Intravenous Q24H  . sodium chloride  1,000 mL Intravenous Once  . sodium chloride  10-40 mL Intracatheter Q12H   Continuous Infusions: . sodium chloride      Data Reviewed: Basic Metabolic Panel:  Recent Labs Lab 11/08/13 0815 11/08/13 1330 11/08/13 1955 11/08/13 2255 11/09/13 0835  NA 122* 118* 123* 120* 126*  K 4.2 4.3 4.4 4.5 4.5  CL 87* 89* 91* 90* 93*  CO2 19 21 22 21 20   GLUCOSE 90 94 97 97 103*  BUN 36* 37* 42* 44* 48*  CREATININE 1.26* 1.29* 1.46* 1.51* 1.68*  CALCIUM 8.3* 8.0* 7.8* 7.7*  7.4*   Liver Function Tests: No results found for this basename: AST, ALT, ALKPHOS, BILITOT, PROT, ALBUMIN,  in the last 168 hours No results found for this basename: LIPASE, AMYLASE,  in the last 168 hours No results found for this basename: AMMONIA,  in the last 168 hours CBC:  Recent Labs Lab 11/07/13 2125 11/08/13 0105 11/09/13 0003 11/09/13 0312  WBC 9.3 10.3 9.3 11.6*  NEUTROABS 7.7 8.8*  --   --   HGB 12.5 12.2 9.0* 9.4*  HCT 35.2* 34.9* 25.1* 26.6*  MCV 90.0 91.4 88.4 88.1  PLT 188 161 120* 138*   Cardiac Enzymes: No results found for this basename: CKTOTAL, CKMB, CKMBINDEX, TROPONINI,  in the last 168 hours BNP (last 3 results) No results found for this basename: PROBNP,  in the last 8760 hours CBG: No results found for this basename: GLUCAP,  in the last 168 hours  Recent Results (from the past 240  hour(s))  URINE CULTURE     Status: None   Collection Time    11/07/13 10:01 PM      Result Value Ref Range Status   Specimen Description URINE, RANDOM   Final   Special Requests NONE   Final   Culture  Setup Time     Final   Value: 11/07/2013 22:29     Performed at [facility] Count     Final   Value: NO GROWTH     Performed at [facility]   Culture     Final   Value: NO GROWTH     Performed at [facility]   Report Status 11/08/2013 FINAL   Final  CULTURE, BLOOD (ROUTINE X 2)     Status: None   Collection Time    11/08/13 12:55 AM      Result Value Ref Range Status   Specimen Description BLOOD BLOOD RIGHT FOREARM   Final   Special Requests BOTTLES DRAWN AEROBIC ONLY 5CC   Final   Culture  Setup Time     Final   Value: 11/08/2013 08:53     Performed at [facility]   Culture     Final   Value:        BLOOD CULTURE RECEIVED NO GROWTH TO DATE CULTURE WILL BE HELD FOR 5 DAYS BEFORE ISSUING A FINAL NEGATIVE REPORT     Performed at [facility]   Report Status PENDING   Incomplete  CULTURE, BLOOD (ROUTINE X  2)     Status: None   Collection Time    11/08/13  1:05 AM      Result Value Ref Range Status   Specimen Description BLOOD RIGHT HAND   Final   Special Requests BOTTLES DRAWN AEROBIC ONLY 5CC   Final   Culture  Setup Time     Final   Value: 11/08/2013 08:54     Performed at [facility]   Culture     Final   Value:        BLOOD CULTURE RECEIVED NO GROWTH TO DATE CULTURE WILL BE HELD FOR 5 DAYS BEFORE ISSUING A FINAL NEGATIVE REPORT     Performed at [facility]   Report Status PENDING   Incomplete     Studies:  Recent x-ray studies have been reviewed in detail by the Attending Physician       Erin Hearing, ANP [facility] Office  [phone] Pager [phone]  **Disclaimer: This note may have been dictated with voice recognition software. Similar sounding words can inadvertently be transcribed and this note may contain transcription errors which may not have been corrected upon publication of note.**   **If unable to reach the above provider after paging please contact the Flow Manager @ [phone]  On-Call/Text Page:      [email or web address]      password [email or web address]  If 7PM-7AM, please contact night-coverage [email or web address] Password [email or web address] 11/09/2013, 12:44 PM   LOS: 2 days  Examined Pt and reviewed A/P w/ ANP Ebony Hail and agree w/ plan Reviewed Plan w/ Pt and answered all questions

## 2013-11-09 NOTE — Progress Notes (Signed)
Patient transferred to 2C03 by stretcher, son with patient belongings. Patient in no distress. Pt chart and medications given to [facility] unit and receiving RN, Otila Kluver.

## 2013-11-09 NOTE — Clinical Documentation Improvement (Signed)
Possible Clinical Conditions?   Acute Blood Loss Anemia Aplastic anemia Precipitous drop in Hematocrit Acute on chronic blood loss anemia Other Condition Cannot Clinically Determine     Signs and Symptoms: Bleeding from rectum with hemorrhoid; likely GI bleed, noted per 5/07 progress notes.  Diagnostics: H/H: 5/06:  12.2/34.9 5/07:   9.0/25.1  Thank You, Theron Arista, Clinical Documentation Specialist:  [facility] Information Management

## 2013-11-09 NOTE — Evaluation (Signed)
Physical Therapy Evaluation Patient Details Name: Cindy Mccormick MRN: [id number] DOB: [birth year] Today's Date: 11/09/2013   History of Present Illness  Pt is a very active 80 y/o female who was previously living independently and alone. Son called EMS as pt was getting progressively more and more confused over a few days time, with a drastic change in mental status 12 hours prior to EMS arriving. PMH significant for lymphoma (10 yrs).  Clinical Impression  Pt admitted with the above mentioned complaints. Pt currently with functional limitations due to the deficits listed below (see PT Problem List). At the time of PT eval, pt with limited verbal response to questions and grabbing at therapist's arms and clothing.  Unable to complete transfer to recliner due to safety. Looking at SNF at d/c due to pt living alone and demonstrating a functional decline since AMS began.      Follow Up Recommendations SNF;Supervision/Assistance - 24 hour    Equipment Recommendations  3in1 (PT)    Recommendations for Other Services OT consult     Precautions / Restrictions Precautions Precautions: Fall Restrictions Weight Bearing Restrictions: No      Mobility  Bed Mobility Overal bed mobility: Needs Assistance Bed Mobility: Supine to Sit     Supine to sit: Total assist;HOB elevated     General bed mobility comments: VC's for sequencing and technique. Hand-over-hand assist to reach for bed rails. Bed pad used to assist in trunk elevation. Limited participation from pt during transfer to sitting.   Transfers Overall transfer level: Needs assistance Equipment used: 1 person hand held assist Transfers: Sit to/from Stand Sit to Stand: Total assist         General transfer comment: Pt with increased anxiety with transfer to standing. Unable to achieve full standing, x2 attempts.   Ambulation/Gait             General Gait Details: Unable to attempt   Stairs            Wheelchair  Mobility    Modified Rankin (Stroke Patients Only)       Balance Overall balance assessment: Needs assistance Sitting-balance support: Feet supported;Single extremity supported Sitting balance-Leahy Scale: Poor   Postural control: Posterior lean Standing balance support: Bilateral upper extremity supported Standing balance-Leahy Scale: Zero                               Pertinent Vitals/Pain Pt does not indicate she is in any pain throughout session.     Home Living Family/patient expects to be discharged to:: Private residence Living Arrangements: Alone Available Help at Discharge: Family;Available 24 hours/day (At least first few days) Type of Home: House Home Access: Stairs to enter Entrance Stairs-Rails: None Entrance Stairs-Number of Steps: 2 Home Layout: One level Home Equipment: Environmental consultant - 2 wheels      Prior Function Level of Independence: Independent         Comments: Does own grocery shopping and ADL's     Hand Dominance   Dominant Hand: Right    Extremity/Trunk Assessment   Upper Extremity Assessment: Defer to OT evaluation           Lower Extremity Assessment: Difficult to assess due to impaired cognition      Cervical / Trunk Assessment: Kyphotic  Communication   Communication: Expressive difficulties (Does not speak much during session)  Cognition Arousal/Alertness: Awake/alert Behavior During Therapy: Flat affect Overall Cognitive Status: Impaired/Different from baseline  Area of Impairment: Orientation;Memory;Following commands;Safety/judgement;Awareness Orientation Level: Disoriented to;Place;Time;Situation   Memory: Decreased short-term memory Following Commands: Follows one step commands inconsistently Safety/Judgement: Decreased awareness of safety Awareness: Intellectual        General Comments      Exercises        Assessment/Plan    PT Assessment Patient needs continued PT services  PT Diagnosis  Difficulty walking   PT Problem List Decreased strength;Decreased range of motion;Decreased activity tolerance;Decreased balance;Decreased mobility;Decreased safety awareness;Decreased knowledge of use of DME;Decreased knowledge of precautions  PT Treatment Interventions DME instruction;Gait training;Stair training;Functional mobility training;Therapeutic activities;Therapeutic exercise;Neuromuscular re-education;Patient/family education   PT Goals (Current goals can be found in the Care Plan section) Acute Rehab PT Goals Patient Stated Goal: Unable to state PT Goal Formulation: With patient/family Time For Goal Achievement: 11/23/13 Potential to Achieve Goals: Good    Frequency Min 3X/week   Barriers to discharge Decreased caregiver support      Co-evaluation               End of Session Equipment Utilized During Treatment: Gait belt Activity Tolerance: Other (comment) (Limited by cognition/AMS) Patient left: in bed;with bed alarm set;with call bell/phone within reach;with family/visitor present Nurse Communication: Mobility status         Time: 1000-1038 PT Time Calculation (min): 38 min   Charges:   PT Evaluation $Initial PT Evaluation Tier I: 1 Procedure PT Treatments $Therapeutic Activity: 23-37 mins   PT G CodesJolyn Lent 11/09/2013, 12:07 PM  Jolyn Lent, PT, DPT Acute Rehabilitation Services Pager: [phone]

## 2013-11-10 ENCOUNTER — Inpatient Hospital Stay (HOSPITAL_COMMUNITY): Payer: Medicare Other

## 2013-11-10 DIAGNOSIS — D696 Thrombocytopenia, unspecified: Secondary | ICD-10-CM | POA: Diagnosis present

## 2013-11-10 DIAGNOSIS — E785 Hyperlipidemia, unspecified: Secondary | ICD-10-CM

## 2013-11-10 DIAGNOSIS — I428 Other cardiomyopathies: Secondary | ICD-10-CM

## 2013-11-10 DIAGNOSIS — I739 Peripheral vascular disease, unspecified: Secondary | ICD-10-CM

## 2013-11-10 LAB — BASIC METABOLIC PANEL
BUN: 57 mg/dL — ABNORMAL HIGH (ref 6–23)
BUN: 62 mg/dL — AB (ref 6–23)
BUN: 57 mg/dL — AB (ref 6–23)
CALCIUM: 7.4 mg/dL — AB (ref 8.4–10.5)
CHLORIDE: 97 meq/L (ref 96–112)
CO2: 16 meq/L — AB (ref 19–32)
CO2: 17 meq/L — AB (ref 19–32)
CO2: 18 mEq/L — ABNORMAL LOW (ref 19–32)
Calcium: 7.1 mg/dL — ABNORMAL LOW (ref 8.4–10.5)
Calcium: 7.2 mg/dL — ABNORMAL LOW (ref 8.4–10.5)
Chloride: 95 mEq/L — ABNORMAL LOW (ref 96–112)
Chloride: 96 mEq/L (ref 96–112)
Creatinine, Ser: 1.87 mg/dL — ABNORMAL HIGH (ref 0.50–1.10)
Creatinine, Ser: 1.97 mg/dL — ABNORMAL HIGH (ref 0.50–1.10)
Creatinine, Ser: 2.1 mg/dL — ABNORMAL HIGH (ref 0.50–1.10)
GFR calc Af Amer: 27 mL/min — ABNORMAL LOW (ref >90)
GFR calc Af Amer: 24 mL/min — ABNORMAL LOW (ref >90)
GFR calc non Af Amer: 24 mL/min — ABNORMAL LOW (ref >90)
GFR calc non Af Amer: 22 mL/min — ABNORMAL LOW (ref >90)
GFR, EST AFRICAN AMERICAN: 26 mL/min — AB (ref >90)
GFR, EST NON AFRICAN AMERICAN: 21 mL/min — AB (ref >90)
GLUCOSE: 91 mg/dL (ref 70–99)
GLUCOSE: 99 mg/dL (ref 70–99)
Glucose, Bld: 117 mg/dL — ABNORMAL HIGH (ref 70–99)
POTASSIUM: 4.3 meq/L (ref 3.7–5.3)
POTASSIUM: 4.6 meq/L (ref 3.7–5.3)
POTASSIUM: 4.6 meq/L (ref 3.7–5.3)
Sodium: 128 mEq/L — ABNORMAL LOW (ref 137–147)
Sodium: 127 mEq/L — ABNORMAL LOW (ref 137–147)
Sodium: 127 mEq/L — ABNORMAL LOW (ref 137–147)

## 2013-11-10 LAB — CBC
HCT: 29.6 % — ABNORMAL LOW (ref 36.0–46.0)
HEMOGLOBIN: 10.6 g/dL — AB (ref 12.0–15.0)
MCH: 30.2 pg (ref 26.0–34.0)
MCHC: 35.8 g/dL (ref 30.0–36.0)
MCV: 84.3 fL (ref 78.0–100.0)
Platelets: 111 10*3/uL — ABNORMAL LOW (ref 150–400)
RBC: 3.51 MIL/uL — ABNORMAL LOW (ref 3.87–5.11)
RDW: 15.4 % (ref 11.5–15.5)
WBC: 12.5 10*3/uL — ABNORMAL HIGH (ref 4.0–10.5)

## 2013-11-10 LAB — TYPE AND SCREEN
ABO/RH(D): O POS
Antibody Screen: NEGATIVE
Unit division: 0
Unit division: 0

## 2013-11-10 NOTE — Progress Notes (Signed)
Bloody stools with bright red clots x 2. VSS. Awaiting results of post transfusion CBC.  Will continue to monitor

## 2013-11-10 NOTE — Progress Notes (Addendum)
Daily Rounding Note  11/10/2013, 11:20 AM  LOS: 3 days   SUBJECTIVE:       2 bloody but smaller stools overnight.  More lucid last evening with family visiting but less so this AM for her son.   OBJECTIVE:         Vital signs in last 24 hours:    Temp:  [96.7 F (35.9 C)-98.2 F (36.8 C)] 97.8 F (36.6 C) (05/08 0400) Pulse Rate:  [79-90] 79 (05/08 0300) Resp:  [15-22] 19 (05/08 0300) BP: (109-161)/(47-73) 119/69 mmHg (05/08 0300) SpO2:  [97 %-99 %] 98 % (05/08 0300) Weight:  [60.2 kg (132 lb 11.5 oz)] 60.2 kg (132 lb 11.5 oz) (05/08 0029) Last BM Date: 11/09/13 General: looks well, nad, comfortable   Heart: RRR Chest: clear, but diminished due to poor effort Abdomen: soft, NT, ND, fullness on right abdomen Extremities: no pedal edema Neuro/Psych:  Alert but mostly aphasic.  Moving limbs.  Inconsistently follows commands.   Intake/Output from previous day: 05/07 0701 - 05/08 0700 In: 1195 [I.V.:810; Blood:285; IV Piggyback:100] Out: 50 [Urine:50]  Intake/Output this shift:    Lab Results:  Recent Labs  11/09/13 0312 11/09/13 1220 11/10/13 0030  WBC 11.6* 8.9 12.5*  HGB 9.4* 6.2* 10.6*  HCT 26.6* 17.5* 29.6*  PLT 138* 102* 111*   BMET  Recent Labs  11/09/13 1220 11/10/13 0005 11/10/13 0558  NA 128* 128* 127*  K 4.4 4.6 4.6  CL 98 95* 97  CO2 19 17* 16*  GLUCOSE 105* 91 99  BUN 48* 57* 57*  CREATININE 1.66* 1.87* 1.97*  CALCIUM 6.5* 7.4* 7.1*    Scheduled Meds: . hydrocortisone  25 mg Rectal BID  . multivitamin with minerals  1 tablet Oral Daily  . pantoprazole (PROTONIX) IV  40 mg Intravenous Q12H  . sodium chloride  10-40 mL Intracatheter Q12H   Continuous Infusions: . sodium chloride 999 mL (11/10/13 0431)   PRN Meds:.clonazePAM, sodium chloride   ASSESMENT:   *  LGIB, probably diverticular, ischemic colitis also possible. 2007 colonoscopy with tics and hemorrhoids. Was taking  Advil prn at home (amount not clear).  Note currently on IV Protonix,  *  ABL anemia.  Good (unexpectedly so) response to 2 units PRBC with Hgb jump of 4.5 grams. *  UTI, treating as outpt with Doxy. No growth on micro, initial UA not impressive as inpt.   On Rocephin and azytromycin. No fever.  WBC elevation may be reactive to GIB.  *  AMS, negative head CT.  MRI planned.  *  Hyponatremia, stable last 24 hours, improved overall. .  *  Renal insufficiency.  *  Lymphoma. *  Recently completed Prednisone taper and Advil for low back pain.    PLAN   *  Will stop the abx.  *  No colonoscopy, the bleeding is slowing, she would need to have NGT placed in order to prep and confusion/aphasia is still and issue.  No urgency to assess colon.     Vena Rua  11/10/2013, 11:20 AM Pager: [phone]  Addendum: Patient seen, examined, and I agree with the above documentation, including the assessment and plan. No evidence for further bleeding. Hemoglobin increased further than one would expect with 2 units of packed cells, though she definitely had what sounds like a lower GI bleed. Presumably diverticular Continue to monitor hemoglobin and for rebleeding No plans for colonoscopy at this time given ongoing neurologic issues  including altered mental status and dysphasia  Can discuss colonoscopy prior to discharge or if bleeding returns

## 2013-11-10 NOTE — Progress Notes (Signed)
Pt's son provided secondary insurance info. [place], subscriber # [id number]. Called Development worker, community and provided them this to update in [place].   Ky Barban, MSW, [facility] Clinical Social Worker [phone]

## 2013-11-10 NOTE — Progress Notes (Signed)
Physical Therapy Treatment Patient Details Name: Cindy Mccormick MRN: [id number] DOB: [birth year] Today's Date: 11/10/2013    History of Present Illness Pt is a very active 80 y/o female who was previously living independently and alone. Son called EMS as pt was getting progressively more and more confused over a few days time, with a drastic change in mental status 12 hours prior to EMS arriving. PMH significant for lymphoma (10 yrs).    PT Comments    Pt showing some progress towards physical therapy goals. Was able to transfer from bed to chair with +2 assist, showing a little more awareness to what the goal of the treatment session was and answering questions more consistently than last session. Frequency updated to 2x/week due to limited participation due to cognition. Will continue to follow and progress per POC.   Follow Up Recommendations  SNF;Supervision/Assistance - 24 hour     Equipment Recommendations  3in1 (PT)    Recommendations for Other Services       Precautions / Restrictions Precautions Precautions: Fall Restrictions Weight Bearing Restrictions: No    Mobility  Bed Mobility Overal bed mobility: Needs Assistance;+2 for physical assistance Bed Mobility: Supine to Sit     Supine to sit: Max assist;+2 for physical assistance;HOB elevated     General bed mobility comments: VC's for sequencing and technique. Hand-over-hand assist to reach for bed rails. Bed pad used to assist in trunk elevation. Limited participation from pt during transfer to sitting.   Transfers Overall transfer level: Needs assistance Equipment used: 2 person hand held assist Transfers: Sit to/from [facility] Sit to Stand: Max assist;+2 physical assistance Stand pivot transfers: Max assist;+2 physical assistance       General transfer comment: Pt able to achieve full standing with +2 assist. VC's for sequencing when pivoting around to the chair.   Ambulation/Gait              General Gait Details: Unable to attempt    Stairs            Wheelchair Mobility    Modified Rankin (Stroke Patients Only)       Balance Overall balance assessment: Needs assistance Sitting-balance support: Feet supported;Bilateral upper extremity supported Sitting balance-Leahy Scale: Poor Sitting balance - Comments: Poor balance at first with ability to progress to fair and independent sitting with bilateral UE assist.    Standing balance support: Bilateral upper extremity supported Standing balance-Leahy Scale: Zero                      Cognition Arousal/Alertness: Awake/alert Behavior During Therapy: Flat affect Overall Cognitive Status: Impaired/Different from baseline Area of Impairment: Orientation;Memory;Following commands;Safety/judgement;Awareness Orientation Level: Disoriented to;Place;Time;Situation   Memory: Decreased short-term memory Following Commands: Follows one step commands inconsistently Safety/Judgement: Decreased awareness of safety Awareness: Intellectual        Exercises      General Comments General comments (skin integrity, edema, etc.): Pt unable to follow commands for therapeutic exercise.      Pertinent Vitals/Pain Pt had no complaints and did not appear to be in any distress this session.     Home Living                      Prior Function            PT Goals (current goals can now be found in the care plan section) Acute Rehab PT Goals Patient Stated Goal: Unable to state PT  Goal Formulation: With patient Time For Goal Achievement: 11/23/13 Potential to Achieve Goals: Good Progress towards PT goals: Progressing toward goals    Frequency  Min 2X/week    PT Plan Frequency needs to be updated    Co-evaluation             End of Session Equipment Utilized During Treatment: Gait belt Activity Tolerance: Other (comment) (Limited by AMS) Patient left: in chair;with chair alarm  set;with call bell/phone within reach     Time: 0952-1010 PT Time Calculation (min): 18 min  Charges:  $Therapeutic Activity: 8-22 mins                    G Codes:      Jolyn Lent 11-19-2013, 11:54 AM  Jolyn Lent, PT, DPT Acute Rehabilitation Services Pager: [phone]

## 2013-11-10 NOTE — Progress Notes (Signed)
[facility]  Cindy Mccormick YWV:[id number] DOB: 12/14/1927 DOA: 11/07/2013 PCP: Laurel Dimmer, MD  Time spent :  Brief narrative: 80 yo WF PMHx highly functional lives alone has h/o recurrrent lymphoma for over 10 years initially found by her urologist around one of her ureters (several recurrences and rounds of chemo being followed by [facility]), LBBB, htn, hld, cardiomyopathy last EF around 40% comes in with several days of worsening confusion noticed by her son. About 2 weeks ago she started having lower back pain, went to see her orthopedic doctor and was put on a round of prednisone. Son says her back pain resolved and she felt wonderful on the prednisone. Finished that last week. A couple days after the finishing that she started with pain lower in her back and towards the side (he does not remember which side). She started to get confused, just mildly on Sunday night (2 night ago). He took her to see her urologist yesterday who diagnosed her with a uti and placed her on doxycycline. She took 2 doses of that, but got much worse over the night. She has not been eating or drinking hardly. He called ems today when he went to check on her, and she was much worse than when he checked on her 12 hours earlier. No cough. She did vomit once today. No diarrhea. She complains of no pain, she cannot provide a reliable history due to her mental status changes. She normally is very alert and with sharp memory  HPI/Subjective: Aphasic today but awake, makes eye contact- per son this is not her baseline  Assessment/Plan:   Hypotension -seemED primarily due to dehydration and anti-HTN meds-RESOLVED 5/8 AFTER FLUIDS AND BLOOD -Cortisol >20 -CONT hold Coreg and Lisinopril -cont IVFs at 100/hr since not eating -Lasix dc'd    Altered mental status/unilateral weakness -felt to be 2/2 hyponatremia initially -previous unilateral weakness resolved- MRI normal -still with  slow to respond speech pattern -clear liquids until colonoscopy    Hyponatremia -was on desmopressin pre admit- ?? For DI?? -Na now stable and rising (127) -Renal consulted and gave 3% NS for several hours -serum osmo 259, urine Osmo 316, urine sodium less than 20-this is either indicative of hypovolemia or salt wasting in setting of renal disease and or use of diuretics -follow BMET daily    Acute Rectal bleeding -onset last pm -had stopped for now as of 5/7 but resumed x 2 bloody stools just after midnight 5/8- as of this am had stopped -GI consulted   ABL anemia/acute thrombocytopenia -baseline Hgb unclear- presented with Hgb 12 but ?? If was dry at that time -follow -hgb decr to 6.2 so was given 2 units PRBCs -CBC q 12 hrs -low platelets probably due to acute GIB - follow- if persists check LDH and haptoglobin to r/o hemolytic process-was NOT on Lasix pre admit and Lasix can cause thrombocytopenia    Acute renal failure/dehydration -baseline 35/1.19 and today up to 48/1.68-may have been dry at admit since "baseline" obtained at admit or elevated BUN reflective of GIB -was on ACE I so ? degree of ATN -IVF hydration    ?? UTI (lower urinary tract infection) -dx'd pre admit and was given doxy by PCP -cx negative    ?? CAP (community acquired pneumonia) -not hypoxic -CXR LLL likely atelectasis and not infection so stopped anbx's    history of LYMPHOMA -followed at [facility] -dx'd 10 yrs prior  HYPERLIPIDEMIA    HYPERTENSION -BP low so usual meds on hold    Chronic systolic heart failure -EF 40-45% per ECHO 2014 -diuretics and ACE I on hold due to ARF and dehydration/hypotension -compensated    PERIPHERAL VASCULAR DISEASE   DVT prophylaxis: SCDs Code Status: Full Family Communication: Son at bedside Disposition Plan/Expected LOS: Stepdown   Consultants: Nephrology Gastroenterology  Procedures: None  Antibiotics: Rocephin 5/5 >>> 5/7 Zithromax 5/5 >>  5/7  Objective: Blood pressure 119/69, pulse 79, temperature 97.8 F (36.6 C), temperature source Oral, resp. rate 19, height 5\' 2"  (1.575 m), weight 132 lb 11.5 oz (60.2 kg), SpO2 98.00%.  Intake/Output Summary (Last 24 hours) at 11/10/13 1027 Last data filed at 11/10/13 0004  Gross per 24 hour  Intake    895 ml  Output      0 ml  Net    895 ml     Exam: General: No acute respiratory distress Lungs: Clear to auscultation bilaterally without wheezes or crackles, RA Cardiovascular: Regular rate and rhythm without murmur gallop or rub normal S1 and S2, no peripheral edema or JVD Abdomen: Nontender, nondistended, soft, bowel sounds positive, no rebound, no ascites, no appreciable mass Musculoskeletal: No significant cyanosis, clubbing of bilateral lower extremities Neurological: Awake and slow to respond verbally- no focal motor deficits   Scheduled Meds:  Scheduled Meds: . hydrocortisone  25 mg Rectal BID  . multivitamin with minerals  1 tablet Oral Daily  . pantoprazole (PROTONIX) IV  40 mg Intravenous Q12H  . sodium chloride  10-40 mL Intracatheter Q12H   Continuous Infusions: . sodium chloride 999 mL (11/10/13 0431)    Data Reviewed: Basic Metabolic Panel:  Recent Labs Lab 11/08/13 2255 11/09/13 0835 11/09/13 1220 11/10/13 0005 11/10/13 0558  NA 120* 126* 128* 128* 127*  K 4.5 4.5 4.4 4.6 4.6  CL 90* 93* 98 95* 97  CO2 21 20 19  17* 16*  GLUCOSE 97 103* 105* 91 99  BUN 44* 48* 48* 57* 57*  CREATININE 1.51* 1.68* 1.66* 1.87* 1.97*  CALCIUM 7.7* 7.4* 6.5* 7.4* 7.1*   Liver Function Tests: No results found for this basename: AST, ALT, ALKPHOS, BILITOT, PROT, ALBUMIN,  in the last 168 hours No results found for this basename: LIPASE, AMYLASE,  in the last 168 hours No results found for this basename: AMMONIA,  in the last 168 hours CBC:  Recent Labs Lab 11/07/13 2125 11/08/13 0105 11/09/13 0003 11/09/13 0312 11/09/13 1220 11/10/13 0030  WBC 9.3 10.3 9.3  11.6* 8.9 12.5*  NEUTROABS 7.7 8.8*  --   --   --   --   HGB 12.5 12.2 9.0* 9.4* 6.2* 10.6*  HCT 35.2* 34.9* 25.1* 26.6* 17.5* 29.6*  MCV 90.0 91.4 88.4 88.1 88.4 84.3  PLT 188 161 120* 138* 102* 111*   Cardiac Enzymes: No results found for this basename: CKTOTAL, CKMB, CKMBINDEX, TROPONINI,  in the last 168 hours BNP (last 3 results) No results found for this basename: PROBNP,  in the last 8760 hours CBG: No results found for this basename: GLUCAP,  in the last 168 hours  Recent Results (from the past 240 hour(s))  URINE CULTURE     Status: None   Collection Time    11/07/13 10:01 PM      Result Value Ref Range Status   Specimen Description URINE, RANDOM   Final   Special Requests NONE   Final   Culture  Setup Time     Final  Value: 11/07/2013 22:29     Performed at [place]     Final   Value: NO GROWTH     Performed at [facility]   Culture     Final   Value: NO GROWTH     Performed at [facility]   Report Status 11/08/2013 FINAL   Final  CULTURE, BLOOD (ROUTINE X 2)     Status: None   Collection Time    11/08/13 12:55 AM      Result Value Ref Range Status   Specimen Description BLOOD BLOOD RIGHT FOREARM   Final   Special Requests BOTTLES DRAWN AEROBIC ONLY 5CC   Final   Culture  Setup Time     Final   Value: 11/08/2013 08:53     Performed at [facility]   Culture     Final   Value:        BLOOD CULTURE RECEIVED NO GROWTH TO DATE CULTURE WILL BE HELD FOR 5 DAYS BEFORE ISSUING A FINAL NEGATIVE REPORT     Performed at [facility]   Report Status PENDING   Incomplete  CULTURE, BLOOD (ROUTINE X 2)     Status: None   Collection Time    11/08/13  1:05 AM      Result Value Ref Range Status   Specimen Description BLOOD RIGHT HAND   Final   Special Requests BOTTLES DRAWN AEROBIC ONLY 5CC   Final   Culture  Setup Time     Final   Value: 11/08/2013 08:54     Performed at [facility]   Culture      Final   Value:        BLOOD CULTURE RECEIVED NO GROWTH TO DATE CULTURE WILL BE HELD FOR 5 DAYS BEFORE ISSUING A FINAL NEGATIVE REPORT     Performed at [facility]   Report Status PENDING   Incomplete     Studies:  Recent x-ray studies have been reviewed in detail by the Attending Physician       Erin Hearing, ANP [facility] Office  [phone] Pager [phone]  **Disclaimer: This note may have been dictated with voice recognition software. Similar sounding words can inadvertently be transcribed and this note may contain transcription errors which may not have been corrected upon publication of note.**   **If unable to reach the above provider after paging please contact the Flow Manager @ [phone]  On-Call/Text Page:      [email or web address]      password [email or web address]  If 7PM-7AM, please contact night-coverage [email or web address] Password [email or web address] 11/10/2013, 10:27 AM   LOS: 3 days   Examined Patient and discussed A/P with ANP Ebony Hail and agree with plan. Discussed plan w/ Pt and all questions answered. Greater then 35 min spent on direct Pt care.

## 2013-11-10 NOTE — Progress Notes (Signed)
Assessment:  1 Hyponatremia, drug induced, improved  2 Recurrent Lymphoma  3 Urinary retention s/p foley  4 AKI, oliguric probably hemodynamic due to ABLA and low BP, r/o obstruction due to lymphoma  Plan:  Renal US Cont IVF   Subjective: Interval History: oliguric  Objective: Vital signs in last 24 hours: Temp:  [96.7 F (35.9 C)-98.2 F (36.8 C)] 97.8 F (36.6 C) (05/08 0400) Pulse Rate:  [79-90] 79 (05/08 0300) Resp:  [15-22] 19 (05/08 0300) BP: (109-161)/(47-73) 119/69 mmHg (05/08 0300) SpO2:  [97 %-99 %] 98 % (05/08 0300) Weight:  [60.2 kg (132 lb 11.5 oz)] 60.2 kg (132 lb 11.5 oz) (05/08 0029) Weight change: 3.3 kg (7 lb 4.4 oz)  Intake/Output from previous day: 05/07 0701 - 05/08 0700 In: 1195 [I.V.:810; Blood:285; IV Piggyback:100] Out: 50 [Urine:50] Intake/Output this shift:    General appearance: awake Resp: clear to auscultation bilaterally Chest wall: no tenderness Cardio: regular rate and rhythm, S1, S2 normal, no murmur, click, rub or gallop Extremities: extremities normal, atraumatic, no cyanosis or edema Neurologic: Confused but moves allextremities  Lab Results:  Recent Labs  11/09/13 1220 11/10/13 0030  WBC 8.9 12.5*  HGB 6.2* 10.6*  HCT 17.5* 29.6*  PLT 102* 111*   BMET:  Recent Labs  11/10/13 0005 11/10/13 0558  NA 128* 127*  K 4.6 4.6  CL 95* 97  CO2 17* 16*  GLUCOSE 91 99  BUN 57* 57*  CREATININE 1.87* 1.97*  CALCIUM 7.4* 7.1*   No results found for this basename: PTH,  in the last 72 hours Iron Studies: No results found for this basename: IRON, TIBC, TRANSFERRIN, FERRITIN,  in the last 72 hours Studies/Results: Mr Brain Wo Contrast  11/10/2013   CLINICAL DATA:  Confusion.  CVA.  EXAM: MRI HEAD WITHOUT CONTRAST  TECHNIQUE: Multiplanar, multiecho pulse sequences of the brain and surrounding structures were obtained without intravenous contrast.  COMPARISON:  CT 11/07/2013  FINDINGS: Cerebral volume normal for age.  Negative for  hydrocephalus.  Negative for acute or chronic infarct. Negative for demyelinating disease. Diffusion-weighted imaging is negative.  Negative for hemorrhage or fluid collection. Negative for mass or edema.  Mild mucosal thickening in the mastoid sinus bilaterally. Paranasal sinuses are clear.  IMPRESSION: Normal MRI of the brain for age.  Mild mastoid sinus mucosal edema bilaterally.   Electronically Signed   By: Franchot Gallo M.D.   On: 11/10/2013 09:01   Scheduled: . hydrocortisone  25 mg Rectal BID  . multivitamin with minerals  1 tablet Oral Daily  . pantoprazole (PROTONIX) IV  40 mg Intravenous Q12H  . sodium chloride  10-40 mL Intracatheter Q12H     LOS: 3 days   Estanislado Emms 11/10/2013,10:29 AM

## 2013-11-10 NOTE — Progress Notes (Addendum)
Clinical Social Work Department CLINICAL SOCIAL WORK PLACEMENT NOTE 11/10/2013  Patient:  Cindy Mccormick, Cindy Mccormick  Account Number:  [id number] Admit date:  11/07/2013  Clinical Social Worker:  Ky Barban, Latanya Presser  Date/time:  11/10/2013 01:40 PM  Clinical Social Work is seeking post-discharge placement for this patient at the following level of care:   SKILLED NURSING   (*CSW will update this form in Epic as items are completed)   N/A-pt's son agreed to SNF search for all SNFs in [facility] Patient/family provided with [place] Department of Clinical Social Work's list of facilities offering this level of care within the geographic area requested by the patient (or if unable, by the patient's family).  11/10/2013  Patient/family informed of their freedom to choose among providers that offer the needed level of care, that participate in Medicare, Medicaid or managed care program needed by the patient, have an available bed and are willing to accept the patient.  N/A-clinicals not sent to this facility  Patient/family informed of [facility]' ownership interest in [facility], as well as of the fact that they are under no obligation to receive care at this facility.  PASARR submitted to EDS on 11/10/2013 PASARR number received from EDS on 11/10/2013  FL2 transmitted to all facilities in geographic area requested by pt/family on  11/10/2013 FL2 transmitted to all facilities within larger geographic area on   Patient informed that his/her managed care company has contracts with or will negotiate with  certain facilities, including the following:     Patient/family informed of bed offers received:  11/11/2013 Patient chooses bed at [facility] ** son states this is the choice currently but he will look at other options and update CSW if choice changes Physician recommends and patient chooses bed at    Patient to be transferred to  [place] on  11/17/2013 Patient to be  transferred to facility by [facility]  The following physician request were entered in Epic:   Additional Comments: CSW provided bed offers on 05/09 and son asked if [facility] or [facility] would be an option. Both facilities said no, but son asked if CSW would send clinicals to them again because she has had mental status change and PT participation has increased. CSW sent to facilities and will update son if their ability to offer changes.   Ky Barban, MSW, [employer] Clinical Social Worker [phone]

## 2013-11-10 NOTE — Progress Notes (Signed)
Clinical Social Work Department BRIEF PSYCHOSOCIAL ASSESSMENT 11/10/2013  Patient:  Cindy Mccormick, Cindy Mccormick     Account Number:  [id number]     Admit date:  11/07/2013  Clinical Social Worker:  Freeman Caldron  Date/Time:  11/10/2013 01:30 PM  Referred by:  Physician  Date Referred:  11/10/2013 Referred for  SNF Placement   Other Referral:   Interview type:  Family Other interview type:   Called pt's son, as pt is disoriented x4.    PSYCHOSOCIAL DATA Living Status:  ALONE Admitted from facility:   Level of care:   Primary support name:  Rossana Molchan [phone]) Primary support relationship to patient:  CHILD, ADULT Degree of support available:   Good--pt's son provides support to pt.    CURRENT CONCERNS Current Concerns  Post-Acute Placement   Other Concerns:    SOCIAL WORK ASSESSMENT / PLAN CSW called pt's son to complete assessment. Son understanding of SNF recommendation and agreed for referrals to all SNFs in [facility]. CSW will follow up with son once facilities have made bed offers.   Assessment/plan status:  Psychosocial Support/Ongoing Assessment of Needs Other assessment/ plan:   Information/referral to community resources:   SNF    PATIENT'S/FAMILY'S RESPONSE TO PLAN OF CARE: Good--pt's son participated in conversation with CSW and understands CSW role.       Ky Barban, MSW, [facility] Clinical Social Worker [phone]

## 2013-11-11 ENCOUNTER — Inpatient Hospital Stay (HOSPITAL_COMMUNITY): Payer: Medicare Other

## 2013-11-11 DIAGNOSIS — K921 Melena: Secondary | ICD-10-CM

## 2013-11-11 DIAGNOSIS — N179 Acute kidney failure, unspecified: Secondary | ICD-10-CM

## 2013-11-11 DIAGNOSIS — K5732 Diverticulitis of large intestine without perforation or abscess without bleeding: Secondary | ICD-10-CM

## 2013-11-11 LAB — URINE MICROSCOPIC-ADD ON

## 2013-11-11 LAB — URINALYSIS, ROUTINE W REFLEX MICROSCOPIC
Bilirubin Urine: NEGATIVE
Glucose, UA: NEGATIVE mg/dL
Ketones, ur: NEGATIVE mg/dL
LEUKOCYTES UA: NEGATIVE
Nitrite: NEGATIVE
Protein, ur: >300 mg/dL — AB
Specific Gravity, Urine: 1.018 (ref 1.005–1.030)
UROBILINOGEN UA: 0.2 mg/dL (ref 0.0–1.0)
pH: 5 (ref 5.0–8.0)

## 2013-11-11 LAB — CBC
HEMATOCRIT: 28.9 % — AB (ref 36.0–46.0)
HEMOGLOBIN: 10.2 g/dL — AB (ref 12.0–15.0)
MCH: 30.3 pg (ref 26.0–34.0)
MCHC: 35.3 g/dL (ref 30.0–36.0)
MCV: 85.8 fL (ref 78.0–100.0)
Platelets: 107 10*3/uL — ABNORMAL LOW (ref 150–400)
RBC: 3.37 MIL/uL — ABNORMAL LOW (ref 3.87–5.11)
RDW: 16.3 % — ABNORMAL HIGH (ref 11.5–15.5)
WBC: 10.7 10*3/uL — ABNORMAL HIGH (ref 4.0–10.5)

## 2013-11-11 LAB — NA AND K (SODIUM & POTASSIUM), RAND UR
Potassium Urine: 25 mEq/L
Sodium, Ur: 23 mEq/L

## 2013-11-11 LAB — COMPREHENSIVE METABOLIC PANEL
ALT: 11 U/L (ref 0–35)
AST: 26 U/L (ref 0–37)
Albumin: 1.7 g/dL — ABNORMAL LOW (ref 3.5–5.2)
Alkaline Phosphatase: 53 U/L (ref 39–117)
BUN: 64 mg/dL — AB (ref 6–23)
CALCIUM: 7.1 mg/dL — AB (ref 8.4–10.5)
CO2: 17 mEq/L — ABNORMAL LOW (ref 19–32)
Chloride: 100 mEq/L (ref 96–112)
Creatinine, Ser: 2.26 mg/dL — ABNORMAL HIGH (ref 0.50–1.10)
GFR calc non Af Amer: 19 mL/min — ABNORMAL LOW (ref >90)
GFR, EST AFRICAN AMERICAN: 22 mL/min — AB (ref >90)
GLUCOSE: 100 mg/dL — AB (ref 70–99)
Potassium: 4.3 mEq/L (ref 3.7–5.3)
SODIUM: 130 meq/L — AB (ref 137–147)
TOTAL PROTEIN: 3.9 g/dL — AB (ref 6.0–8.3)
Total Bilirubin: 0.4 mg/dL (ref 0.3–1.2)

## 2013-11-11 LAB — LIPID PANEL
Cholesterol: 147 mg/dL (ref 0–200)
HDL: 25 mg/dL — AB (ref >39)
LDL CALC: 50 mg/dL (ref 0–99)
Total CHOL/HDL Ratio: 5.9 RATIO
Triglycerides: 362 mg/dL — ABNORMAL HIGH (ref <150)
VLDL: 72 mg/dL — ABNORMAL HIGH (ref 0–40)

## 2013-11-11 LAB — CREATININE, URINE, RANDOM: Creatinine, Urine: 68.4 mg/dL

## 2013-11-11 LAB — LACTATE DEHYDROGENASE: LDH: 514 U/L — ABNORMAL HIGH (ref 94–250)

## 2013-11-11 MED ORDER — FUROSEMIDE 40 MG PO TABS
40.0000 mg | ORAL_TABLET | Freq: Every day | ORAL | Status: DC
  Administered 2013-11-11: 40 mg via ORAL
  Filled 2013-11-11: qty 1

## 2013-11-11 MED ORDER — ACETAMINOPHEN 325 MG PO TABS
650.0000 mg | ORAL_TABLET | Freq: Once | ORAL | Status: AC
  Administered 2013-11-11: 650 mg via ORAL

## 2013-11-11 MED ORDER — SODIUM CHLORIDE 0.9 % IV SOLN
INTRAVENOUS | Status: DC
Start: 2013-11-11 — End: 2013-11-15
  Administered 2013-11-11: 999 mL via INTRAVENOUS
  Administered 2013-11-12 (×2): via INTRAVENOUS
  Administered 2013-11-14: 50 mL/h via INTRAVENOUS
  Administered 2013-11-15: 07:00:00 via INTRAVENOUS

## 2013-11-11 NOTE — Progress Notes (Signed)
Weekend CSW provided patient and patient's daughter-in-law who was present at the bedside with bed offers to review. No questions at this time.  Tilden Fossa, MSW, [place] Clinical Social Worker [facility] Emergency Dept. [phone]

## 2013-11-11 NOTE — Progress Notes (Signed)
Progress Note   Subjective  No further bleeding, no hematochezia or melena Patient tolerating liquid diet Son at bedside Patient still having difficulty with verbalization, MRI was negative Had renal ultrasound this morning She denies pain   Objective  Vital signs in last 24 hours: Temp:  [97.4 F (36.3 C)-98.5 F (36.9 C)] 97.4 F (36.3 C) (05/09 0800) Pulse Rate:  [85-92] 92 (05/09 0800) Resp:  [17-24] 17 (05/09 0800) BP: (134-145)/(58-67) 143/65 mmHg (05/09 0800) SpO2:  [95 %-97 %] 96 % (05/09 0800) Weight:  [142 lb 6.7 oz (64.6 kg)] 142 lb 6.7 oz (64.6 kg) (05/09 0814) Last BM Date: 11/09/13 Gen: awake, alert, elderly female in no acute distress, able to answer some questions with yes or no, states "I don't know what I need" HEENT: anicteric, op clear CV: RRR, no mrg Pulm: CTA b/l Abd: soft, NT/ND, +BS throughout Ext: no c/c/e   Intake/Output from previous day: 05/08 0701 - 05/09 0700 In: 2540 [P.O.:240; I.V.:2300] Out: 525 [Urine:525] Intake/Output this shift: Total I/O In: 100 [I.V.:100] Out: -   Lab Results:  Recent Labs  11/09/13 1220 11/10/13 0030 11/11/13 0500  WBC 8.9 12.5* 10.7*  HGB 6.2* 10.6* 10.2*  HCT 17.5* 29.6* 28.9*  PLT 102* 111* 107*   BMET  Recent Labs  11/10/13 0558 11/10/13 1700 11/11/13 0500  NA 127* 127* 130*  K 4.6 4.3 4.3  CL 97 96 100  CO2 16* 18* 17*  GLUCOSE 99 117* 100*  BUN 57* 62* 64*  CREATININE 1.97* 2.10* 2.26*  CALCIUM 7.1* 7.2* 7.1*   LFT  Recent Labs  11/11/13 0500  PROT 3.9*  ALBUMIN 1.7*  AST 26  ALT 11  ALKPHOS 53  BILITOT 0.4   PT/INR No results found for this basename: LABPROT, INR,  in the last 72 hours Hepatitis Panel No results found for this basename: HEPBSAG, HCVAB, HEPAIGM, HEPBIGM,  in the last 72 hours  Studies/Results: Mr Brain Wo Contrast  11/10/2013   CLINICAL DATA:  Confusion.  CVA.  EXAM: MRI HEAD WITHOUT CONTRAST  TECHNIQUE: Multiplanar, multiecho pulse sequences of  the brain and surrounding structures were obtained without intravenous contrast.  COMPARISON:  CT 11/07/2013  FINDINGS: Cerebral volume normal for age.  Negative for hydrocephalus.  Negative for acute or chronic infarct. Negative for demyelinating disease. Diffusion-weighted imaging is negative.  Negative for hemorrhage or fluid collection. Negative for mass or edema.  Mild mucosal thickening in the mastoid sinus bilaterally. Paranasal sinuses are clear.  IMPRESSION: Normal MRI of the brain for age.  Mild mastoid sinus mucosal edema bilaterally.   Electronically Signed   By: Franchot Gallo M.D.   On: 11/10/2013 09:01      Assessment & Plan  80 yo with history of lymphoma, hypertension, hyperlipidemia, cardiomyopathy who is admitted with worsening confusion. Developed likely diverticular hemorrhage now resolved  1.  LGIB -- no further bleeding with stable hemoglobin. Most likely etiology is diverticular. Given issues with renal function, hypernatremia, altered mental status and neurologic deficits of unclear etiology, I am hesitant to prep her for colonoscopy. I also do not want to sedate her given altered mental status at this time, if possible. --Monitor for rebleeding, follow hemoglobin --Will advance diet  2.  Hyponatremia -- improving per primary team and renal team  3.  acute kidney injury -- renal team following, renal ultrasound performed, history of lymphoma with involvement of the left ureter.  4.  Altered mental status/dysarthria and dysphasia -- per primary  team who is considering neurology consult  Call with rebleeding  Principal Problem:   Altered mental status Active Problems:   LYMPHOMA   HYPERLIPIDEMIA   HYPERTENSION   CARDIOMYOPATHY   UNSPECIFIED PERIPHERAL VASCULAR DISEASE   Hyponatremia   UTI (lower urinary tract infection)   CAP (community acquired pneumonia)   Rectal bleeding   Acute right-sided weakness   Hypotension   Thrombocytopenia, unspecified     LOS:  4 days   Cindy Mccormick  11/11/2013, 9:58 AM

## 2013-11-11 NOTE — Progress Notes (Signed)
[facility] TEAM 1 - Stepdown/ICU TEAM Progress Note  KEIANNA SIGNER AYT:[id number] DOB: 1928-06-24 DOA: 11/07/2013 PCP: Laurel Dimmer, MD  Admit HPI / Brief Narrative: 80 yo WF PMHx highly functional lives alone has h/o recurrrent lymphoma for over 10 years initially found by her urologist around one of her ureters (several recurrences and rounds of chemo being followed by [facility]), LBBB, htn, hld, cardiomyopathy last EF around 40% comes in with several days of worsening confusion noticed by her son. About 2 weeks ago she started having lower back pain, went to see her orthopedic doctor and was put on a round of prednisone. Son says her back pain resolved and she felt wonderful on the prednisone. Finished that last week. A couple days after the finishing that she started with pain lower in her back and towards the side (he does not remember which side). She started to get confused, just mildly on Sunday night (2 night ago). He took her to see her urologist yesterday who diagnosed her with a uti and placed her on doxycycline. She took 2 doses of that, but got much worse over the night. She has not been eating or drinking hardly. He called ems today when he went to check on her, and she was much worse than when he checked on her 12 hours earlier. No cough. She did vomit once today. No diarrhea. She complains of no pain, she cannot provide a reliable history due to her mental status changes. She normally is very alert and with sharp memory   HPI/Subjective: 5/9 this a.m. patient unable to communicate. Expressive dysphasia which appears to wax and wane as yesterday patient able to answer questions appropriately. Episodes of staring into space. Son verified that prior to this admission patient was highly functioning able to drive, and perform ADLs on her own.  Assessment/Plan: Hypotension  -Resolved with hydration and 2 units PRBC transfused on 5/7  -Cortisol >20  -CONT hold Coreg and Lisinopril     Altered mental status/unilateral weakness  -felt to be 2/2 hyponatremia initially  -previous unilateral weakness resolved- MRI normal  -5/9 patient's mental status still not improving would be expected, with correction of hyponatremia and elimination of infectious source or acute CVA. Consult neurology absence seizure? -5/9 still waxing and waning periods of expressive dysphasia and staring spells (absence seizure?)     Hyponatremia  -On admission Na= 118 -was on desmopressin pre admit- ?? For DI??  -Renal consulted and gave 3% NS for several hours  -serum osmo 259, urine Osmo 316, urine sodium less than 20-this is either indicative of hypovolemia or salt wasting in setting of renal disease and or use of diuretics  -5/9 Na now stable and rising (130)  -follow BMET daily   Acute Rectal bleeding  -onset last pm  -had stopped for now as of 5/7 but resumed x 2 bloody stools just after midnight 5/8- as of this am had stopped, H&H stable  - 5/9 GI has delayed/discontinued plans for colonoscopy and a patient more stable. Continue clear liquids until colonoscopy   ABL anemia/acute thrombocytopenia  -baseline Hgb unclear- presented with Hgb 12 but (possible hemoconcentration) -hgb decr to 6.2 so was -5/7 transfused  2 units PRBCs  -5/9 CBC daily   -low platelets probably due to acute GIB; stable but low will obtain LDH and haptoglobin to r/o hemolytic process -was NOT on Lasix pre admit and Lasix can cause thrombocytopenia   Acute renal failure/dehydration  -5/9 baseline 35/1.19 and today  up to 47 /2.26 may have been dry at admit since "baseline" obtained at admit or elevated BUN reflective of GIB  -Patient with worsening renal function will obtain urine electrolytes, urinalysis, ATN ? -Continue IVF hydration normal saline 128ml/hr -Regional ultrasound results pending; ( previous involvement of left ureter; recurrence?)  UTI (lower urinary tract infection)  -dx'd pre admit and was given  doxy by PCP  -cx negative; antibiotics stopped   CAP (community acquired pneumonia)  -not hypoxic  -CXR LLL likely atelectasis and not infection so stopped anbx's   history of LYMPHOMA  -followed at [place]  -dx'd 10 yrs prior   HYPERLIPIDEMIA  -Obtain lipid panel  HYPERTENSION  -BP low so usual meds on hold   Chronic systolic heart failure  -EF 40-45% per ECHO 2014  -diuretics and ACE I on hold due to ARF and dehydration/hypotension  -compensated -Admission bed weight= 54.8 kg, bed weight 5/8= 60.2 kg   PERIPHERAL VASCULAR DISEASE    Code Status: FULL Family Communication: no family present at time of exam Disposition Plan:     Consultants: Dr. Ulice Dash Pyrtle (GI)  Dr. Adriana Mccallum (nephrology) Dr. Alexis Goodell (neurology)   Procedure/Significant Events: 5/5 PCXR -Left lower lobe airspace disease may reflect atelectasis versus is infiltrate.  5/5 CT head without contrast -No acute intracranial abnormality.  5/7 transfused 2 units PRBC   5/8 MRI brain without Negative for acute/chronic infarct. Negative for demyelinating disease.     Culture 5/6 blood culture right hand/forearm NTD (pending) 5/5 urine culture negative (final)   Antibiotics: Rocephin 5/5 >>> 5/7  Zithromax 5/5 >> 5/7   DVT prophylaxis: SCD   Devices N./A.   LINES / TUBES:  5/5 20 GA left antecubital 5/6 PICC/midline double-lumen right basilic    Continuous Infusions: . sodium chloride 100 mL/hr at 11/11/13 0551    Objective: VITAL SIGNS: Temp: 97.4 F (36.3 C) (05/09 0800) Temp src: Axillary (05/09 0800) BP: 143/65 mmHg (05/09 0800) Pulse Rate: 92 (05/09 0800) SPO2; 96% room air FIO2:   Intake/Output Summary (Last 24 hours) at 11/11/13 0816 Last data filed at 11/11/13 0700  Gross per 24 hour  Intake   2440 ml  Output    525 ml  Net   1915 ml     Exam: General: Patients with periods of staring into space, expressive aphasia, will attempt to cooperate with  exam.  Lungs: Clear to auscultation bilaterally without wheezes or crackles Cardiovascular: Regular rate and rhythm without murmur gallop or rub normal S1 and S2 Abdomen: Nontender, nondistended, soft, bowel sounds positive, no rebound, no ascites, no appreciable mass Extremities: No significant cyanosis, clubbing, or edema bilateral lower extremities Neurologic; cranial nerves II through XII intact, extremity strength 5/5, sensation intact, again patient with expressive aphasia today, periods of witnessed staring into space lasting approximately 3-4 minutes. Patient then appears confused but will contact you with her eyes and attempt to follow commands.  Data Reviewed: Basic Metabolic Panel:  Recent Labs Lab 11/09/13 1220 11/10/13 0005 11/10/13 0558 11/10/13 1700 11/11/13 0500  NA 128* 128* 127* 127* 130*  K 4.4 4.6 4.6 4.3 4.3  CL 98 95* 97 96 100  CO2 19 17* 16* 18* 17*  GLUCOSE 105* 91 99 117* 100*  BUN 48* 57* 57* 62* 64*  CREATININE 1.66* 1.87* 1.97* 2.10* 2.26*  CALCIUM 6.5* 7.4* 7.1* 7.2* 7.1*   Liver Function Tests:  Recent Labs Lab 11/11/13 0500  AST 26  ALT 11  ALKPHOS 53  BILITOT 0.4  PROT 3.9*  ALBUMIN 1.7*   No results found for this basename: LIPASE, AMYLASE,  in the last 168 hours No results found for this basename: AMMONIA,  in the last 168 hours CBC:  Recent Labs Lab 11/07/13 2125 11/08/13 0105 11/09/13 0003 11/09/13 0312 11/09/13 1220 11/10/13 0030 11/11/13 0500  WBC 9.3 10.3 9.3 11.6* 8.9 12.5* 10.7*  NEUTROABS 7.7 8.8*  --   --   --   --   --   HGB 12.5 12.2 9.0* 9.4* 6.2* 10.6* 10.2*  HCT 35.2* 34.9* 25.1* 26.6* 17.5* 29.6* 28.9*  MCV 90.0 91.4 88.4 88.1 88.4 84.3 85.8  PLT 188 161 120* 138* 102* 111* 107*   Cardiac Enzymes: No results found for this basename: CKTOTAL, CKMB, CKMBINDEX, TROPONINI,  in the last 168 hours BNP (last 3 results) No results found for this basename: PROBNP,  in the last 8760 hours CBG: No results found for  this basename: GLUCAP,  in the last 168 hours  Recent Results (from the past 240 hour(s))  URINE CULTURE     Status: None   Collection Time    11/07/13 10:01 PM      Result Value Ref Range Status   Specimen Description URINE, RANDOM   Final   Special Requests NONE   Final   Culture  Setup Time     Final   Value: 11/07/2013 22:29     Performed at [facility] Count     Final   Value: NO GROWTH     Performed at [facility]   Culture     Final   Value: NO GROWTH     Performed at [facility]   Report Status 11/08/2013 FINAL   Final  CULTURE, BLOOD (ROUTINE X 2)     Status: None   Collection Time    11/08/13 12:55 AM      Result Value Ref Range Status   Specimen Description BLOOD BLOOD RIGHT FOREARM   Final   Special Requests BOTTLES DRAWN AEROBIC ONLY 5CC   Final   Culture  Setup Time     Final   Value: 11/08/2013 08:53     Performed at [facility]   Culture     Final   Value:        BLOOD CULTURE RECEIVED NO GROWTH TO DATE CULTURE WILL BE HELD FOR 5 DAYS BEFORE ISSUING A FINAL NEGATIVE REPORT     Performed at [facility]   Report Status PENDING   Incomplete  CULTURE, BLOOD (ROUTINE X 2)     Status: None   Collection Time    11/08/13  1:05 AM      Result Value Ref Range Status   Specimen Description BLOOD RIGHT HAND   Final   Special Requests BOTTLES DRAWN AEROBIC ONLY 5CC   Final   Culture  Setup Time     Final   Value: 11/08/2013 08:54     Performed at [facility]   Culture     Final   Value:        BLOOD CULTURE RECEIVED NO GROWTH TO DATE CULTURE WILL BE HELD FOR 5 DAYS BEFORE ISSUING A FINAL NEGATIVE REPORT     Performed at [facility]   Report Status PENDING   Incomplete     Studies:  Recent x-ray studies have been reviewed in detail by the Attending Physician  Scheduled Meds:  Scheduled Meds: . hydrocortisone  25  mg Rectal BID  . multivitamin with minerals  1 tablet Oral Daily  .  pantoprazole (PROTONIX) IV  40 mg Intravenous Q12H  . sodium chloride  10-40 mL Intracatheter Q12H    Time spent on care of this patient: 40 mins   Allie Bossier , MD   [facility] Office  [phone] Pager [phone]  On-Call/Text Page:      [email or web address]      password [email or web address]  If 7PM-7AM, please contact night-coverage [email or web address] Password [email or web address] 11/11/2013, 8:16 AM   LOS: 4 days

## 2013-11-11 NOTE — ED Provider Notes (Signed)
Medical screening examination/treatment/procedure(s) were conducted as a shared visit with non-physician practitioner(s) and myself.  I personally evaluated the patient during the encounter.   EKG Interpretation   Date/Time:  Tuesday Nov 07 2013 20:49:40 EDT Ventricular Rate:  95 PR Interval:  161 QRS Duration: 135 QT Interval:  376 QTC Calculation: 473 R Axis:   40 Text Interpretation:  Sinus rhythm IVCD, consider atypical LBBB ED  PHYSICIAN INTERPRETATION AVAILABLE IN [place] [place] Confirmed by TEST,  Record ([id number]) on 11/09/2013 7:12:30 AM      Patient seen examined. Unable to review history due to dementia. Clinically appears dry. Mucous membranes. Labs to hypernatremia infection. Agree with admission.  Tanna Furry, MD 11/11/13 1655

## 2013-11-11 NOTE — Progress Notes (Signed)
Assessment:  1 Hyponatremia, drug induced, improved  2 Recurrent Lymphoma  3 Urinary retention s/p foley  4 AKI, oliguric probably hemodynamic due to ABLA and low BP, r/o obstruction due to lymphoma  5 Altered mental status Plan:  Renal US report pending Cont IVF  Subjective: Interval History: Speech sl more fluent per son; hematochezia this am per son  Objective: Vital signs in last 24 hours: Temp:  [97.4 F (36.3 C)-98.5 F (36.9 C)] 97.4 F (36.3 C) (05/09 0800) Pulse Rate:  [85-99] 99 (05/09 1000) Resp:  [17-24] 20 (05/09 1000) BP: (134-145)/(58-67) 143/65 mmHg (05/09 0800) SpO2:  [95 %-97 %] 97 % (05/09 1000) Weight:  [64.6 kg (142 lb 6.7 oz)] 64.6 kg (142 lb 6.7 oz) (05/09 0814) Weight change:   Intake/Output from previous day: 05/08 0701 - 05/09 0700 In: 2540 [P.O.:240; I.V.:2300] Out: 525 [Urine:525] Intake/Output this shift: Total I/O In: 100 [I.V.:100] Out: -   General appearance: alert and dysarthic ? aphasic Chest wall: no tenderness GI: soft, non-tender; bowel sounds normal; no masses,  no organomegaly Tr le edema   Lab Results:  Recent Labs  11/10/13 0030 11/11/13 0500  WBC 12.5* 10.7*  HGB 10.6* 10.2*  HCT 29.6* 28.9*  PLT 111* 107*   BMET:  Recent Labs  11/10/13 1700 11/11/13 0500  NA 127* 130*  K 4.3 4.3  CL 96 100  CO2 18* 17*  GLUCOSE 117* 100*  BUN 62* 64*  CREATININE 2.10* 2.26*  CALCIUM 7.2* 7.1*   No results found for this basename: PTH,  in the last 72 hours Iron Studies: No results found for this basename: IRON, TIBC, TRANSFERRIN, FERRITIN,  in the last 72 hours Studies/Results: Mr Brain Wo Contrast  11/10/2013   CLINICAL DATA:  Confusion.  CVA.  EXAM: MRI HEAD WITHOUT CONTRAST  TECHNIQUE: Multiplanar, multiecho pulse sequences of the brain and surrounding structures were obtained without intravenous contrast.  COMPARISON:  CT 11/07/2013  FINDINGS: Cerebral volume normal for age.  Negative for hydrocephalus.  Negative for  acute or chronic infarct. Negative for demyelinating disease. Diffusion-weighted imaging is negative.  Negative for hemorrhage or fluid collection. Negative for mass or edema.  Mild mucosal thickening in the mastoid sinus bilaterally. Paranasal sinuses are clear.  IMPRESSION: Normal MRI of the brain for age.  Mild mastoid sinus mucosal edema bilaterally.   Electronically Signed   By: Franchot Gallo M.D.   On: 11/10/2013 09:01   Scheduled: . hydrocortisone  25 mg Rectal BID  . multivitamin with minerals  1 tablet Oral Daily  . pantoprazole (PROTONIX) IV  40 mg Intravenous Q12H  . sodium chloride  10-40 mL Intracatheter Q12H      LOS: 4 days   Estanislado Emms 11/11/2013,10:51 AM

## 2013-11-11 NOTE — Consult Note (Signed)
NEURO HOSPITALIST CONSULT NOTE    Reason for Consult: confusion  HPI:                                                                                                                                          Cindy Mccormick is an 80 y.o. female, left handed, with a past medical history significant for HTN, hyperlipidemia, cardiomyopathy EF around 40%,recurrrent lymphoma for over 10 years and rounds of chemo being followed by [facility], recurrent UTI's, admitted to [facility] due to worsening confusion for several days.  Daughter is at the bedside and stated that Cindy Mccormick is a very sharp, functional, independent person but last Tuesday they noticed " a drastic change in her condition" characterized by slurred speech, confusion with trouble organizing her thought and engaging in conversation, looking " distant, in another world", inability to form sentences. This has been ongoing ever since. No reported falls, fever, skin rash, or head trauma. Daughter is not aware of patient complaining of HA, vertigo, double vision, focal weakness, or visual disturbances. Family reports no concerns regarding cognitive impairment. Had a recent short course of PO prednisone for back pain but cortisol was normal.  Initial work up revealed evidence of hyponatremia (128), UTI, and hypotension. She also was found to have lower GI bleed requiring blood transfusion. MRI brain egative for acute infarct or other abnormality that could explain her confusional state. According to her daughter, today she was able to recognize all family members an call them by their names.  Past Medical History  Diagnosis Date  . LEFT BUNDLE BRANCH BLOCK   . HYPERTENSION   . HYPERLIPIDEMIA   . CARDIOMYOPATHY   . LYMPHOMA   . DIVERTICULITIS, COLON   . Hemorrhoids, internal   . H/O: hysterectomy 1977  . Maintenance chemotherapy     Past Surgical History  Procedure Laterality Date  . Appendectomy  1949  . Toe surgery    .  Lymphoma biopsies      Family History  Problem Relation Age of Onset  . Lymphoma Mother   . Sudden death Father   . Hypertension Neg Hx   . Hyperlipidemia Neg Hx   . Heart attack Neg Hx   . Diabetes Neg Hx     Social History:  reports that she has quit smoking. She does not have any smokeless tobacco history on file. She reports that she does not drink alcohol. Her drug history is not on file.  Allergies  Allergen Reactions  . Ezetimibe Other (See Comments)  . Hydrocodone-Acetaminophen Other (See Comments)  . Nitrofurantoin Other (See Comments)  . Other Itching and Rash    "chemo" medication given at [facility] in [place], [place]  . Oxycodone Hcl Other (See Comments)  . Sulfonamide Derivatives Other (  See Comments)    MEDICATIONS:                                                                                                                     I have reviewed the patient's current medications.   ROS: unable to obtain due to mental status.                                                                                                                     History obtained from chart review and daughter.  Physical exam: pleasant female in no apparent distress, but doesn't participate in conversation. Blood pressure 133/65, pulse 92, temperature 98.3 F (36.8 C), temperature source Oral, resp. rate 18, height _0  (1.575 m), weight 64.6 kg (142 lb 6.7 oz), SpO2 98.00%. Head: normocephalic. Neck: supple, no bruits, no JVD. Cardiac: no murmurs. Lungs: clear. Abdomen: soft, no tender, no mass. Extremities: no edema. CV: pulses palpable throughout  Neurologic Examination:                                                                                                      Mental Status: Alert, awake, follows only simple commands and then becomes inattentive. Verbal output is very reduced but comprehension appears to be appropriate and I can not confidently say that she has  expressive aphasia.  Cranial Nerves: II: Discs flat bilaterally; Visual fields grossly normal, pupils equal, round, reactive to light III,IV, VI: ptosis not present, extra-ocular motions intact bilaterally V,VII: smile symmetric, facial light touch sensation normal bilaterally VIII: hearing impaired bilaterally IX,X: gag reflex present XI: bilateral shoulder shrug XII: midline tongue extension without atrophy or fasciculations Motor: Moves all limbs spontaneously and symmetrically Tone and bulk:normal tone throughout; no atrophy noted Sensory: no tested. Deep Tendon Reflexes:  1+ all over Plantars: Right: downgoing   Left: downgoing Cerebellar: She doesn't follow instructions to perform this maeuver. Gait: no tested. No meningeal irritation signs.   Lab Results  Component Value Date/Time   CHOL 147 11/11/2013 10:04 AM    Results for orders placed during the hospital encounter  of 11/07/13 (from the past 48 hour(s))  BASIC METABOLIC PANEL     Status: Abnormal   Collection Time    11/10/13 12:05 AM      Result Value Ref Range   Sodium 128 (*) 137 - 147 mEq/L   Potassium 4.6  3.7 - 5.3 mEq/L   Chloride 95 (*) 96 - 112 mEq/L   CO2 17 (*) 19 - 32 mEq/L   Glucose, Bld 91  70 - 99 mg/dL   BUN 57 (*) 6 - 23 mg/dL   Creatinine, Ser 1.87 (*) 0.50 - 1.10 mg/dL   Calcium 7.4 (*) 8.4 - 10.5 mg/dL   GFR calc non Af Amer 24 (*) >90 mL/min   GFR calc Af Amer 27 (*) >90 mL/min   Comment: (NOTE)     The eGFR has been calculated using the CKD EPI equation.     This calculation has not been validated in all clinical situations.     eGFR's persistently <90 mL/min signify possible Chronic Kidney     Disease.  CBC     Status: Abnormal   Collection Time    11/10/13 12:30 AM      Result Value Ref Range   WBC 12.5 (*) 4.0 - 10.5 K/uL   RBC 3.51 (*) 3.87 - 5.11 MIL/uL   Hemoglobin 10.6 (*) 12.0 - 15.0 g/dL   Comment: DELTA CHECK NOTED     POST TRANSFUSION SPECIMEN   HCT 29.6 (*) 36.0 -  46.0 %   MCV 84.3  78.0 - 100.0 fL   MCH 30.2  26.0 - 34.0 pg   MCHC 35.8  30.0 - 36.0 g/dL   RDW 15.4  11.5 - 15.5 %   Platelets 111 (*) 150 - 400 K/uL   Comment: CONSISTENT WITH PREVIOUS RESULT  BASIC METABOLIC PANEL     Status: Abnormal   Collection Time    11/10/13  5:58 AM      Result Value Ref Range   Sodium 127 (*) 137 - 147 mEq/L   Potassium 4.6  3.7 - 5.3 mEq/L   Chloride 97  96 - 112 mEq/L   CO2 16 (*) 19 - 32 mEq/L   Glucose, Bld 99  70 - 99 mg/dL   BUN 57 (*) 6 - 23 mg/dL   Creatinine, Ser 1.97 (*) 0.50 - 1.10 mg/dL   Calcium 7.1 (*) 8.4 - 10.5 mg/dL   GFR calc non Af Amer 22 (*) >90 mL/min   GFR calc Af Amer 26 (*) >90 mL/min   Comment: (NOTE)     The eGFR has been calculated using the CKD EPI equation.     This calculation has not been validated in all clinical situations.     eGFR's persistently <90 mL/min signify possible Chronic Kidney     Disease.  BASIC METABOLIC PANEL     Status: Abnormal   Collection Time    11/10/13  5:00 PM      Result Value Ref Range   Sodium 127 (*) 137 - 147 mEq/L   Potassium 4.3  3.7 - 5.3 mEq/L   Chloride 96  96 - 112 mEq/L   CO2 18 (*) 19 - 32 mEq/L   Glucose, Bld 117 (*) 70 - 99 mg/dL   BUN 62 (*) 6 - 23 mg/dL   Creatinine, Ser 2.10 (*) 0.50 - 1.10 mg/dL   Calcium 7.2 (*) 8.4 - 10.5 mg/dL   GFR calc non Af Amer 21 (*) >90 mL/min  GFR calc Af Amer 24 (*) >90 mL/min   Comment: (NOTE)     The eGFR has been calculated using the CKD EPI equation.     This calculation has not been validated in all clinical situations.     eGFR's persistently <90 mL/min signify possible Chronic Kidney     Disease.  CBC     Status: Abnormal   Collection Time    11/11/13  5:00 AM      Result Value Ref Range   WBC 10.7 (*) 4.0 - 10.5 K/uL   RBC 3.37 (*) 3.87 - 5.11 MIL/uL   Hemoglobin 10.2 (*) 12.0 - 15.0 g/dL   HCT 28.9 (*) 36.0 - 46.0 %   MCV 85.8  78.0 - 100.0 fL   MCH 30.3  26.0 - 34.0 pg   MCHC 35.3  30.0 - 36.0 g/dL   RDW 16.3 (*)  11.5 - 15.5 %   Platelets 107 (*) 150 - 400 K/uL   Comment: CONSISTENT WITH PREVIOUS RESULT  COMPREHENSIVE METABOLIC PANEL     Status: Abnormal   Collection Time    11/11/13  5:00 AM      Result Value Ref Range   Sodium 130 (*) 137 - 147 mEq/L   Potassium 4.3  3.7 - 5.3 mEq/L   Chloride 100  96 - 112 mEq/L   CO2 17 (*) 19 - 32 mEq/L   Glucose, Bld 100 (*) 70 - 99 mg/dL   BUN 64 (*) 6 - 23 mg/dL   Creatinine, Ser 2.26 (*) 0.50 - 1.10 mg/dL   Calcium 7.1 (*) 8.4 - 10.5 mg/dL   Total Protein 3.9 (*) 6.0 - 8.3 g/dL   Albumin 1.7 (*) 3.5 - 5.2 g/dL   AST 26  0 - 37 U/L   ALT 11  0 - 35 U/L   Alkaline Phosphatase 53  39 - 117 U/L   Total Bilirubin 0.4  0.3 - 1.2 mg/dL   GFR calc non Af Amer 19 (*) >90 mL/min   GFR calc Af Amer 22 (*) >90 mL/min   Comment: (NOTE)     The eGFR has been calculated using the CKD EPI equation.     This calculation has not been validated in all clinical situations.     eGFR's persistently <90 mL/min signify possible Chronic Kidney     Disease.  URINALYSIS, ROUTINE W REFLEX MICROSCOPIC     Status: Abnormal   Collection Time    11/11/13 10:00 AM      Result Value Ref Range   Color, Urine YELLOW  YELLOW   APPearance CLOUDY (*) CLEAR   Specific Gravity, Urine 1.018  1.005 - 1.030   pH 5.0  5.0 - 8.0   Glucose, UA NEGATIVE  NEGATIVE mg/dL   Hgb urine dipstick LARGE (*) NEGATIVE   Bilirubin Urine NEGATIVE  NEGATIVE   Ketones, ur NEGATIVE  NEGATIVE mg/dL   Protein, ur >300 (*) NEGATIVE mg/dL   Urobilinogen, UA 0.2  0.0 - 1.0 mg/dL   Nitrite NEGATIVE  NEGATIVE   Leukocytes, UA NEGATIVE  NEGATIVE  LACTATE DEHYDROGENASE     Status: Abnormal   Collection Time    11/11/13 10:00 AM      Result Value Ref Range   LDH 514 (*) 94 - 250 U/L  URINE MICROSCOPIC-ADD ON     Status: Abnormal   Collection Time    11/11/13 10:00 AM      Result Value Ref Range   Squamous Epithelial /  LPF RARE  RARE   WBC, UA 3-6  <3 WBC/hpf   RBC / HPF 7-10  <3 RBC/hpf   Casts  HYALINE CASTS (*) NEGATIVE   Comment: GRANULAR CAST  NA AND K (SODIUM & POTASSIUM), RAND UR     Status: None   Collection Time    11/11/13 10:01 AM      Result Value Ref Range   Sodium, Ur 23     Potassium Urine Timed 25    CREATININE, URINE, RANDOM     Status: None   Collection Time    11/11/13 10:01 AM      Result Value Ref Range   Creatinine, Urine 68.40    LIPID PANEL     Status: Abnormal   Collection Time    11/11/13 10:04 AM      Result Value Ref Range   Cholesterol 147  0 - 200 mg/dL   Triglycerides 362 (*) <150 mg/dL   HDL 25 (*) >39 mg/dL   Total CHOL/HDL Ratio 5.9     VLDL 72 (*) 0 - 40 mg/dL   LDL Cholesterol 50  0 - 99 mg/dL   Comment:            Total Cholesterol/HDL:CHD Risk     Coronary Heart Disease Risk Table                         Men   Women      1/2 Average Risk   3.4   3.3      Average Risk       5.0   4.4      2 X Average Risk   9.6   7.1      3 X Average Risk  23.4   11.0                Use the calculated Patient Ratio     above and the CHD Risk Table     to determine the patient's CHD Risk.                ATP III CLASSIFICATION (LDL):      <100     mg/dL   Optimal      100-129  mg/dL   Near or Above                        Optimal      130-159  mg/dL   Borderline      160-189  mg/dL   High      >190     mg/dL   Very High    Mr Brain Wo Contrast  11/10/2013   CLINICAL DATA:  Confusion.  CVA.  EXAM: MRI HEAD WITHOUT CONTRAST  TECHNIQUE: Multiplanar, multiecho pulse sequences of the brain and surrounding structures were obtained without intravenous contrast.  COMPARISON:  CT 11/07/2013  FINDINGS: Cerebral volume normal for age.  Negative for hydrocephalus.  Negative for acute or chronic infarct. Negative for demyelinating disease. Diffusion-weighted imaging is negative.  Negative for hemorrhage or fluid collection. Negative for mass or edema.  Mild mucosal thickening in the mastoid sinus bilaterally. Paranasal sinuses are clear.  IMPRESSION: Normal  MRI of the brain for age.  Mild mastoid sinus mucosal edema bilaterally.   Electronically Signed   By: Franchot Gallo M.D.   On: 11/10/2013 09:01   US Renal  11/11/2013   CLINICAL DATA:  lymphoma and  AKI  EXAM: RENAL/URINARY TRACT ULTRASOUND COMPLETE  COMPARISON:  CT 01/20/2012  FINDINGS: Right Kidney:  Length: 11.2 cm. Parenchyma echogenic compared to adjacent liver. No hydronephrosis.  Left Kidney:  Length: 11.3 cm. No hydronephrosis. Several small cysts, largest 20 x 17 mm in the lower pole.  Bladder:  Decompressed by Foley catheter.  There is a small amount of pelvic ascites identified.  11 mm gallstone with gallbladder wall thickening up to 5 mm.  IMPRESSION: 1. Negative for hydronephrosis. 2. Echogenic renal parenchyma, a nonspecific indicator of medical renal disease. 3. Cholelithiasis with gallbladder wall thickening. 4. Small amount of pelvic ascites.   Electronically Signed   By: Arne Cleveland M.D.   On: 11/11/2013 14:57   Assessment/Plan: 80 y/o with persistent confusional state, new onset. No lateralizing neurological signs but language is difficult to assess as she appears to be inattentive. MRI brain unremarkable. Still with mild metabolic derangements. UTI resolved. Doesn't look toxic and white count normal, thus doubt CNS infection. MRI shows no findings compatible with meningeal carcinomatosis and thus will not pursue LP at this moment. Unlikely partial complex seizures but will get EEG. I am still  more inclined to believe that she most likely has a toxic metabolic encephalopathy. Will follow up after EEG.  Dorian Pod, MD 11/11/2013, 8:49 PM [facility]

## 2013-11-12 ENCOUNTER — Inpatient Hospital Stay (HOSPITAL_COMMUNITY): Payer: Medicare Other

## 2013-11-12 DIAGNOSIS — M311 Thrombotic microangiopathy, unspecified: Secondary | ICD-10-CM

## 2013-11-12 DIAGNOSIS — D696 Thrombocytopenia, unspecified: Secondary | ICD-10-CM

## 2013-11-12 DIAGNOSIS — D649 Anemia, unspecified: Secondary | ICD-10-CM

## 2013-11-12 DIAGNOSIS — K5731 Diverticulosis of large intestine without perforation or abscess with bleeding: Secondary | ICD-10-CM

## 2013-11-12 DIAGNOSIS — E860 Dehydration: Secondary | ICD-10-CM

## 2013-11-12 DIAGNOSIS — N289 Disorder of kidney and ureter, unspecified: Secondary | ICD-10-CM

## 2013-11-12 LAB — CBC WITH DIFFERENTIAL/PLATELET
Basophils Absolute: 0 10*3/uL (ref 0.0–0.1)
Basophils Relative: 0 % (ref 0–1)
Eosinophils Absolute: 0.1 10*3/uL (ref 0.0–0.7)
Eosinophils Relative: 1 % (ref 0–5)
HCT: 26.3 % — ABNORMAL LOW (ref 36.0–46.0)
Hemoglobin: 9.2 g/dL — ABNORMAL LOW (ref 12.0–15.0)
LYMPHS ABS: 0.5 10*3/uL — AB (ref 0.7–4.0)
LYMPHS PCT: 5 % — AB (ref 12–46)
MCH: 30.6 pg (ref 26.0–34.0)
MCHC: 35 g/dL (ref 30.0–36.0)
MCV: 87.4 fL (ref 78.0–100.0)
Monocytes Absolute: 0.8 10*3/uL (ref 0.1–1.0)
Monocytes Relative: 7 % (ref 3–12)
NEUTROS PCT: 87 % — AB (ref 43–77)
Neutro Abs: 8.7 10*3/uL — ABNORMAL HIGH (ref 1.7–7.7)
PLATELETS: 130 10*3/uL — AB (ref 150–400)
RBC: 3.01 MIL/uL — AB (ref 3.87–5.11)
RDW: 16.6 % — ABNORMAL HIGH (ref 11.5–15.5)
WBC: 10.1 10*3/uL (ref 4.0–10.5)

## 2013-11-12 LAB — NA AND K (SODIUM & POTASSIUM), RAND UR
POTASSIUM UR: 36 meq/L
SODIUM UR: 23 meq/L

## 2013-11-12 LAB — CREATININE, URINE, RANDOM: Creatinine, Urine: 85.08 mg/dL

## 2013-11-12 LAB — URINALYSIS, ROUTINE W REFLEX MICROSCOPIC
BILIRUBIN URINE: NEGATIVE
Glucose, UA: NEGATIVE mg/dL
Ketones, ur: NEGATIVE mg/dL
Leukocytes, UA: NEGATIVE
NITRITE: NEGATIVE
PH: 5 (ref 5.0–8.0)
Protein, ur: >300 mg/dL — AB
Specific Gravity, Urine: 1.019 (ref 1.005–1.030)
UROBILINOGEN UA: 0.2 mg/dL (ref 0.0–1.0)

## 2013-11-12 LAB — URINE MICROSCOPIC-ADD ON

## 2013-11-12 LAB — OSMOLALITY, URINE: Osmolality, Ur: 337 mOsm/kg — ABNORMAL LOW (ref 390–1090)

## 2013-11-12 LAB — PROTIME-INR
INR: 1.26 (ref 0.00–1.49)
Prothrombin Time: 15.5 seconds — ABNORMAL HIGH (ref 11.6–15.2)

## 2013-11-12 LAB — OSMOLALITY: Osmolality: 288 mOsm/kg (ref 275–300)

## 2013-11-12 LAB — PROTEIN / CREATININE RATIO, URINE
Creatinine, Urine: 70.34 mg/dL
Protein Creatinine Ratio: 6.45 — ABNORMAL HIGH (ref 0.00–0.15)
TOTAL PROTEIN, URINE: 453.5 mg/dL

## 2013-11-12 LAB — LACTATE DEHYDROGENASE: LDH: 461 U/L — AB (ref 94–250)

## 2013-11-12 LAB — URIC ACID: Uric Acid, Serum: 8.3 mg/dL — ABNORMAL HIGH (ref 2.4–7.0)

## 2013-11-12 LAB — SAVE SMEAR

## 2013-11-12 LAB — RETICULOCYTES
RBC.: 3.16 MIL/uL — AB (ref 3.87–5.11)
RETIC CT PCT: 2.3 % (ref 0.4–3.1)
Retic Count, Absolute: 72.7 10*3/uL (ref 19.0–186.0)

## 2013-11-12 LAB — FIBRINOGEN: Fibrinogen: 553 mg/dL — ABNORMAL HIGH (ref 204–475)

## 2013-11-12 LAB — HAPTOGLOBIN: HAPTOGLOBIN: 84 mg/dL (ref 45–215)

## 2013-11-12 LAB — APTT: aPTT: 33 seconds (ref 24–37)

## 2013-11-12 LAB — AMMONIA: AMMONIA: 20 umol/L (ref 11–60)

## 2013-11-12 MED ORDER — CARVEDILOL 3.125 MG PO TABS
3.1250 mg | ORAL_TABLET | Freq: Two times a day (BID) | ORAL | Status: DC
  Administered 2013-11-12: 3.125 mg via ORAL
  Filled 2013-11-12 (×2): qty 1

## 2013-11-12 MED ORDER — CARVEDILOL 3.125 MG PO TABS
3.1250 mg | ORAL_TABLET | Freq: Two times a day (BID) | ORAL | Status: DC
  Filled 2013-11-12 (×2): qty 1

## 2013-11-12 MED ORDER — PIPERACILLIN-TAZOBACTAM IN DEX 2-0.25 GM/50ML IV SOLN
2.2500 g | Freq: Three times a day (TID) | INTRAVENOUS | Status: DC
  Administered 2013-11-12 – 2013-11-13 (×3): 2.25 g via INTRAVENOUS
  Filled 2013-11-12 (×5): qty 50

## 2013-11-12 MED ORDER — IOHEXOL 300 MG/ML  SOLN
20.0000 mL | INTRAMUSCULAR | Status: AC
  Administered 2013-11-12 (×2): 25 mL via ORAL

## 2013-11-12 MED ORDER — CARVEDILOL 3.125 MG PO TABS
3.1250 mg | ORAL_TABLET | Freq: Two times a day (BID) | ORAL | Status: DC
  Administered 2013-11-13 – 2013-11-17 (×9): 3.125 mg via ORAL
  Filled 2013-11-12 (×11): qty 1

## 2013-11-12 MED ORDER — CARVEDILOL 6.25 MG PO TABS: 6.2500 mg | ORAL_TABLET | Freq: Two times a day (BID) | ORAL | Status: DC

## 2013-11-12 MED ORDER — PANTOPRAZOLE SODIUM 40 MG PO TBEC
40.0000 mg | DELAYED_RELEASE_TABLET | Freq: Every day | ORAL | Status: DC
Start: 2013-11-12 — End: 2013-11-12
  Administered 2013-11-12: 40 mg via ORAL
  Filled 2013-11-12: qty 1

## 2013-11-12 MED ORDER — DAPTOMYCIN 500 MG IV SOLR
350.0000 mg | INTRAVENOUS | Status: DC
  Administered 2013-11-12 – 2013-11-13 (×2): 350 mg via INTRAVENOUS
  Filled 2013-11-12 (×2): qty 7

## 2013-11-12 NOTE — Progress Notes (Signed)
Daily Rounding Note  11/12/2013, 8:17 AM  LOS: 5 days   SUBJECTIVE:       Passed small amount blood yesterday afternoon, a later stool had small volume of blood but was mostly a brown stool.  Speaking sometimes, at others: no speaking.   OBJECTIVE:         Vital signs in last 24 hours:    Temp:  [97.5 F (36.4 C)-98.5 F (36.9 C)] 97.5 F (36.4 C) (05/10 0745) Pulse Rate:  [84-104] 95 (05/10 0745) Resp:  [18-22] 18 (05/10 0745) BP: (106-155)/(63-76) 146/63 mmHg (05/10 0745) SpO2:  [94 %-98 %] 95 % (05/10 0745) Weight:  [63.4 kg (139 lb 12.4 oz)] 63.4 kg (139 lb 12.4 oz) (05/10 0500) Last BM Date: 11/11/13 General: looks well, aphasic, comfortable   Heart: RRR Chest: clear.  breathing easlily.  Abdomen: soft, NT, ND.  Active BS  Extremities: no CCE Neuro/Psych:  Relaxed, not agitated.  Not following commands or speaking at present  Intake/Output from previous day: 05/09 0701 - 05/10 0700 In: 1751.7 [I.V.:1751.7] Out: 775 [Urine:775]  Intake/Output this shift:    Lab Results:  Recent Labs  11/09/13 1220 11/10/13 0030 11/11/13 0500  WBC 8.9 12.5* 10.7*  HGB 6.2* 10.6* 10.2*  HCT 17.5* 29.6* 28.9*  PLT 102* 111* 107*   BMET  Recent Labs  11/10/13 0558 11/10/13 1700 11/11/13 0500  NA 127* 127* 130*  K 4.6 4.3 4.3  CL 97 96 100  CO2 16* 18* 17*  GLUCOSE 99 117* 100*  BUN 57* 62* 64*  CREATININE 1.97* 2.10* 2.26*  CALCIUM 7.1* 7.2* 7.1*   LFT  Recent Labs  11/11/13 0500  PROT 3.9*  ALBUMIN 1.7*  AST 26  ALT 11  ALKPHOS 53  BILITOT 0.4   Studies/Results: Mr Brain Wo Contrast  11/10/2013   CLINICAL DATA:  Confusion.  CVA.  EXAM: MRI HEAD WITHOUT CONTRAST  TECHNIQUE: Multiplanar, multiecho pulse sequences of the brain and surrounding structures were obtained without intravenous contrast.  COMPARISON:  CT 11/07/2013  FINDINGS: Cerebral volume normal for age.  Negative for hydrocephalus.   Negative for acute or chronic infarct. Negative for demyelinating disease. Diffusion-weighted imaging is negative.  Negative for hemorrhage or fluid collection. Negative for mass or edema.  Mild mucosal thickening in the mastoid sinus bilaterally. Paranasal sinuses are clear.  IMPRESSION: Normal MRI of the brain for age.  Mild mastoid sinus mucosal edema bilaterally.   Electronically Signed   By: Franchot Gallo M.D.   On: 11/10/2013 09:01   US Renal  11/11/2013   CLINICAL DATA:  lymphoma and AKI  EXAM: RENAL/URINARY TRACT ULTRASOUND COMPLETE  COMPARISON:  CT 01/20/2012  FINDINGS: Right Kidney:  Length: 11.2 cm. Parenchyma echogenic compared to adjacent liver. No hydronephrosis.  Left Kidney:  Length: 11.3 cm. No hydronephrosis. Several small cysts, largest 20 x 17 mm in the lower pole.  Bladder:  Decompressed by Foley catheter.  There is a small amount of pelvic ascites identified.  11 mm gallstone with gallbladder wall thickening up to 5 mm.  IMPRESSION: 1. Negative for hydronephrosis. 2. Echogenic renal parenchyma, a nonspecific indicator of medical renal disease. 3. Cholelithiasis with gallbladder wall thickening. 4. Small amount of pelvic ascites.   Electronically Signed   By: Arne Cleveland M.D.   On: 11/11/2013 14:57    ASSESMENT:   *  LGIB.  Likely diverticular  *  ABL anemia.  S/p 2 PRBC on 5/7  *  AMS, dysphagia. Neurology following.  *  Hyponatremia.  Improved.  *  Thrombocytopenia.  *  AKI *  Hx lymphoma.   *  Low albumin, protein malnutrition.    PLAN   *  Switch from IV BID to po daily Protonix.  *  Check Ammonia level.  *  No plans for colonoscopy at present, but consider closer to time of discharge or if mental status improves further.   Last colonosclopy with Dr Deatra Ina was in 2007: hemorrhoids and tics.    Vena Rua  11/12/2013, 8:17 AM Pager: [phone]

## 2013-11-12 NOTE — Progress Notes (Signed)
ANTIBIOTIC CONSULT NOTE - INITIAL  Pharmacy Consult for Daptomycin, Zosyn Indication: sepsis  Allergies  Allergen Reactions  . Ezetimibe Other (See Comments)  . Hydrocodone-Acetaminophen Other (See Comments)  . Nitrofurantoin Other (See Comments)  . Other Itching and Rash    "chemo" medication given at [facility] in [place], [place]  . Oxycodone Hcl Other (See Comments)  . Sulfonamide Derivatives Other (See Comments)    Patient Measurements: Height: 5\' 2"  (157.5 cm) Weight: 139 lb 12.4 oz (63.4 kg) IBW/kg (Calculated) : 50.1  Vital Signs: Temp: 97.8 F (36.6 C) (05/10 1230) Temp src: Axillary (05/10 1230) BP: 149/79 mmHg (05/10 1703) Pulse Rate: 92 (05/10 1703) Intake/Output from previous day: 05/09 0701 - 05/10 0700 In: 1751.7 [I.V.:1751.7] Out: 775 [Urine:775] Intake/Output from this shift: Total I/O In: 777.1 [P.O.:240; I.V.:537.1] Out: 200 [Urine:200]  Labs:  Recent Labs  11/10/13 0030 11/10/13 0558 11/10/13 1700 11/11/13 0500 11/11/13 1001 11/12/13 1146  WBC 12.5*  --   --  10.7*  --   --   HGB 10.6*  --   --  10.2*  --   --   PLT 111*  --   --  107*  --   --   LABCREA  --   --   --   --  68.40 70.34  CREATININE  --  1.97* 2.10* 2.26*  --   --    Estimated Creatinine Clearance: 16.2 ml/min (by C-G formula based on Cr of 2.26). No results found for this basename: VANCOTROUGH, Corlis Leak, VANCORANDOM, [place], GENTPEAK, GENTRANDOM, TOBRATROUGH, TOBRAPEAK, TOBRARND, AMIKACINPEAK, AMIKACINTROU, AMIKACIN,  in the last 72 hours   Microbiology: Recent Results (from the past 720 hour(s))  URINE CULTURE     Status: None   Collection Time    11/07/13 10:01 PM      Result Value Ref Range Status   Specimen Description URINE, RANDOM   Final   Special Requests NONE   Final   Culture  Setup Time     Final   Value: 11/07/2013 22:29     Performed at [facility] Count     Final   Value: NO GROWTH     Performed at [facility]    Culture     Final   Value: NO GROWTH     Performed at [facility]   Report Status 11/08/2013 FINAL   Final  CULTURE, BLOOD (ROUTINE X 2)     Status: None   Collection Time    11/08/13 12:55 AM      Result Value Ref Range Status   Specimen Description BLOOD BLOOD RIGHT FOREARM   Final   Special Requests BOTTLES DRAWN AEROBIC ONLY 5CC   Final   Culture  Setup Time     Final   Value: 11/08/2013 08:53     Performed at [facility]   Culture     Final   Value:        BLOOD CULTURE RECEIVED NO GROWTH TO DATE CULTURE WILL BE HELD FOR 5 DAYS BEFORE ISSUING A FINAL NEGATIVE REPORT     Performed at [facility]   Report Status PENDING   Incomplete  CULTURE, BLOOD (ROUTINE X 2)     Status: None   Collection Time    11/08/13  1:05 AM      Result Value Ref Range Status   Specimen Description BLOOD RIGHT HAND   Final   Special Requests BOTTLES DRAWN AEROBIC ONLY  5CC   Final   Culture  Setup Time     Final   Value: 11/08/2013 08:54     Performed at [facility]   Culture     Final   Value:        BLOOD CULTURE RECEIVED NO GROWTH TO DATE CULTURE WILL BE HELD FOR 5 DAYS BEFORE ISSUING A FINAL NEGATIVE REPORT     Performed at [facility]   Report Status PENDING   Incomplete    Medical History: Past Medical History  Diagnosis Date  . LEFT BUNDLE BRANCH BLOCK   . HYPERTENSION   . HYPERLIPIDEMIA   . CARDIOMYOPATHY   . LYMPHOMA   . DIVERTICULITIS, COLON   . Hemorrhoids, internal   . H/O: hysterectomy 1977  . Maintenance chemotherapy      Assessment: 80 year old female with a history of lymphoma now with AMS.  Her renal function is worsening.  Pharmacy asked to begin daptomycin and Zosyn for probable sepsis  Goal of Therapy:  Appropriate dosing  Plan:  1) Zosyn 2.25 grams iv Q 8 hours 2) Daptomycin 350 mg iv Q 48 hours 3) Follow up Scr, fever curve, cultures, progress  Thank you. Anette Guarneri, PharmD [phone]  Tad Moore 11/12/2013,5:03 PM

## 2013-11-12 NOTE — Progress Notes (Signed)
[facility] TEAM 1 - Stepdown/ICU TEAM Progress Note  Cindy Mccormick [id number] DOB: [birth year] DOA: 11/07/2013 PCP: Laurel Dimmer, MD  Admit HPI / Brief Narrative: 80 yo WF PMHx lymphoma, cardiomyopathy, HTN, PVD, hypotension, HLD, Hx diverticulitis.  Highly functional lives alone has h/o recurrrent lymphoma for over 10 years initially found by her urologist around one of her ureters (several recurrences and rounds of chemo being followed by [facility]), LBBB, htn, hld, cardiomyopathy last EF around 40% comes in with several days of worsening confusion noticed by her son. About 2 weeks ago she started having lower back pain, went to see her orthopedic doctor and was put on a round of prednisone. Son says her back pain resolved and she felt wonderful on the prednisone. Finished that last week. A couple days after the finishing that she started with pain lower in her back and towards the side (he does not remember which side). She started to get confused, just mildly on Sunday night (2 night ago). He took her to see her urologist yesterday who diagnosed her with a uti and placed her on doxycycline. She took 2 doses of that, but got much worse over the night. She has not been eating or drinking hardly. He called ems today when he went to check on her, and she was much worse than when he checked on her 12 hours earlier. No cough. She did vomit once today. No diarrhea. She complains of no pain, she cannot provide a reliable history due to her mental status changes. She normally is very alert and with sharp memory 5/9 this a.m. patient unable to communicate. Expressive dysphasia which appears to wax and wane as yesterday patient able to answer questions appropriately. Episodes of staring into space. Son verified that prior to this admission patient was highly functioning able to drive, and perform ADLs on her own.   HPI/Subjective: 5/10 this a.m. patient able to communicate, however still having  Expressive aphasia (difficulty answering questions). Daughter-in-law states that earlier today she was noncommunicative but has perked up later in the day.     Assessment/Plan: Hypotension  -Resolved with hydration and 2 units PRBC transfused on 5/7  -Cortisol >20  -Restart Coreg 3.125 mg BID -Continue to hold Lisinopril secondary to patient's worsening renal function.   Altered mental status/unilateral weakness  -felt to be 2/2 hyponatremia initially  -previous unilateral weakness resolved- MRI normal  -5/9 patient's mental status still not improving would be expected, with correction of hyponatremia and elimination of infectious source or acute CVA. Consult neurology absence seizure? -5/9 still waxing and waning periods of expressive dysphasia and staring spells (absence seizure?); Neurology not convinced these are seizures, however will perform EEG     Hyponatremia  -On admission Na= 118 -was on desmopressin pre admit- ?? For DI??  -Renal consulted and gave 3% NS for several hours  -serum osmo 259, urine Osmo 316, urine sodium less than 20-this is either indicative of hypovolemia or salt wasting in setting of renal disease and or use of diuretics  -5/9 Na now stable and rising (130)  -Continue to follow BMP daily    Acute Rectal bleeding  -onset last pm  -had stopped for now as of 5/7 but resumed x 2 bloody stools just after midnight 5/8- as of this am had stopped, H&H stable  - 5/9 GI has delayed/discontinued plans for colonoscopy and a patient more stable. Continue clear liquids until colonoscopy  -5/10 GI recommendations; No plans for colonoscopy at present,  but consider closer to time of discharge or if mental status improves further   ABL anemia/acute thrombocytopenia  -baseline Hgb unclear- presented with Hgb 12 but (possible hemoconcentration) -hgb decr to 6.2 so was -5/7 transfused  2 units PRBCs  -5/9 CBC daily   -low platelets probably due to acute GIB; stable but low  will obtain LDH and haptoglobin to r/o hemolytic process -LDH high, fibrinogen high, reticulocyte count low, -C3 complement, C4 complement, haptoglobin pending  Recurrence of lymphoma? -Obtain abdomen/pelvis CT without contrast -Dr. Wilmon Arms (hematology/oncology) following.  Acute renal failure/dehydration  -5/9 baseline 35/1.19 and today up to 64 /2.26 may have been dry at admit since "baseline" obtained at admit or elevated BUN reflective of GIB  -Patient with worsening renal function will obtain urine electrolytes, urinalysis, ATN ? -Continue IVF hydration normal saline 179ml/hr -Regional ultrasound results pending; ( previous involvement of left ureter; recurrence?)  UTI (lower urinary tract infection)  -dx'd pre admit and was given doxy by PCP  -cx negative; antibiotics stopped   CAP (community acquired pneumonia)  -not hypoxic  -CXR LLL likely atelectasis and not infection so stopped anbx's   Hx  LYMPHOMA  -followed at [facility]  -dx'd 10 yrs prior  -low platelets probably due to acute GIB; stable but low will obtain LDH and haptoglobin to r/o hemolytic process -LDH high, fibrinogen high, reticulocyte count low, -C3 complement, C4 complement, haptoglobin pending  Infection -Per Dr. Wilmon Arms (hematology/oncology) PBS consistent with infection. And has recommended ordering patient on empiric Zosyn +Daptomycin (worsening renal function) -obtain CT abdomen and pelvis without contrast rule out recurrance lymphoma  HYPERLIPIDEMIA  -Obtain lipid panel  HYPERTENSION  -Restart Coreg 3.125 BID  Chronic systolic heart failure  -EF 40-45% per ECHO 2014  -diuretics and ACE I on hold due to ARF and dehydration/hypotension  -compensated -Admission bed weight= 54.8 kg, bed weight 5/8= 60.2 kg   PERIPHERAL VASCULAR DISEASE    Code Status: FULL Family Communication: no family present at time of exam Disposition Plan:     Consultants: Dr. Ulice Dash Pyrtle (GI)  Dr. Adriana Mccallum (nephrology) Dr. Alexis Goodell (neurology) Dr. Wilmon Arms (hematology/oncology)   Procedure/Significant Events: 5/5 PCXR -Left lower lobe airspace disease may reflect atelectasis versus is infiltrate.  5/5 CT head without contrast -No acute intracranial abnormality.  5/7 transfused 2 units PRBC   5/8 MRI brain without Negative for acute/chronic infarct. Negative for demyelinating disease.     Culture 5/6 blood culture right hand/forearm NTD (pending) 5/5 urine culture negative (final)   Antibiotics: Rocephin 5/5 >>> 5/7  Zithromax 5/5 >> 5/7 Zosyn 5/10>> Daptomycin 5/10>>  DVT prophylaxis: SCD   Devices N./A.   LINES / TUBES:  5/5 20 GA left antecubital 5/6 PICC/midline double-lumen right basilic    Continuous Infusions: . sodium chloride 50 mL/hr at 11/12/13 1029    Objective: VITAL SIGNS: Temp: 97.8 F (36.6 C) (05/10 1230) Temp src: Axillary (05/10 1230) BP: 144/60 mmHg (05/10 1230) Pulse Rate: 99 (05/10 1230) SPO2; 96% room air FIO2:   Intake/Output Summary (Last 24 hours) at 11/12/13 1604 Last data filed at 11/12/13 0915  Gross per 24 hour  Intake   1665 ml  Output    525 ml  Net   1140 ml     Exam: General: Patients with periods of staring into space, expressive aphasia, will attempt to cooperate with exam.  Lungs: Clear to auscultation bilaterally without wheezes or crackles Cardiovascular: Regular rate and rhythm without murmur gallop or  rub normal S1 and S2 Abdomen: Nontender, nondistended, soft, bowel sounds positive, no rebound, no ascites, no appreciable mass Extremities: No significant cyanosis, clubbing, or edema bilateral lower extremities Neurologic; cranial nerves II through XII intact, extremity strength 5/5, sensation intact, again patient with expressive aphasia today, periods of witnessed staring into space lasting approximately 3-4 minutes. Patient then appears confused but will contact you with her eyes and  attempt to follow commands.  Data Reviewed: Basic Metabolic Panel:  Recent Labs Lab 11/09/13 1220 11/10/13 0005 11/10/13 0558 11/10/13 1700 11/11/13 0500  NA 128* 128* 127* 127* 130*  K 4.4 4.6 4.6 4.3 4.3  CL 98 95* 97 96 100  CO2 19 17* 16* 18* 17*  GLUCOSE 105* 91 99 117* 100*  BUN 48* 57* 57* 62* 64*  CREATININE 1.66* 1.87* 1.97* 2.10* 2.26*  CALCIUM 6.5* 7.4* 7.1* 7.2* 7.1*   Liver Function Tests:  Recent Labs Lab 11/11/13 0500  AST 26  ALT 11  ALKPHOS 53  BILITOT 0.4  PROT 3.9*  ALBUMIN 1.7*   No results found for this basename: LIPASE, AMYLASE,  in the last 168 hours  Recent Labs Lab 11/12/13 0930  AMMONIA 20   CBC:  Recent Labs Lab 11/07/13 2125 11/08/13 0105 11/09/13 0003 11/09/13 0312 11/09/13 1220 11/10/13 0030 11/11/13 0500  WBC 9.3 10.3 9.3 11.6* 8.9 12.5* 10.7*  NEUTROABS 7.7 8.8*  --   --   --   --   --   HGB 12.5 12.2 9.0* 9.4* 6.2* 10.6* 10.2*  HCT 35.2* 34.9* 25.1* 26.6* 17.5* 29.6* 28.9*  MCV 90.0 91.4 88.4 88.1 88.4 84.3 85.8  PLT 188 161 120* 138* 102* 111* 107*   Cardiac Enzymes: No results found for this basename: CKTOTAL, CKMB, CKMBINDEX, TROPONINI,  in the last 168 hours BNP (last 3 results) No results found for this basename: PROBNP,  in the last 8760 hours CBG: No results found for this basename: GLUCAP,  in the last 168 hours  Recent Results (from the past 240 hour(s))  URINE CULTURE     Status: None   Collection Time    11/07/13 10:01 PM      Result Value Ref Range Status   Specimen Description URINE, RANDOM   Final   Special Requests NONE   Final   Culture  Setup Time     Final   Value: 11/07/2013 22:29     Performed at [facility]     Final   Value: NO GROWTH     Performed at [facility]   Culture     Final   Value: NO GROWTH     Performed at [facility]   Report Status 11/08/2013 FINAL   Final  CULTURE, BLOOD (ROUTINE X 2)     Status: None   Collection Time     11/08/13 12:55 AM      Result Value Ref Range Status   Specimen Description BLOOD BLOOD RIGHT FOREARM   Final   Special Requests BOTTLES DRAWN AEROBIC ONLY 5CC   Final   Culture  Setup Time     Final   Value: 11/08/2013 08:53     Performed at [facility]   Culture     Final   Value:        BLOOD CULTURE RECEIVED NO GROWTH TO DATE CULTURE WILL BE HELD FOR 5 DAYS BEFORE ISSUING A FINAL NEGATIVE REPORT     Performed at [facility]   Report Status PENDING   Incomplete  CULTURE, BLOOD (ROUTINE X 2)     Status: None   Collection Time    11/08/13  1:05 AM      Result Value Ref Range Status   Specimen Description BLOOD RIGHT HAND   Final   Special Requests BOTTLES DRAWN AEROBIC ONLY 5CC   Final   Culture  Setup Time     Final   Value: 11/08/2013 08:54     Performed at [facility]   Culture     Final   Value:        BLOOD CULTURE RECEIVED NO GROWTH TO DATE CULTURE WILL BE HELD FOR 5 DAYS BEFORE ISSUING A FINAL NEGATIVE REPORT     Performed at [facility]   Report Status PENDING   Incomplete     Studies:  Recent x-ray studies have been reviewed in detail by the Attending Physician  Scheduled Meds:  Scheduled Meds: . multivitamin with minerals  1 tablet Oral Daily  . sodium chloride  10-40 mL Intracatheter Q12H    Time spent on care of this patient: 40 mins   Allie Bossier , MD   [facility] Office  [phone] Pager [phone]  On-Call/Text Page:      [email or web address]      password [email or web address]  If 7PM-7AM, please contact night-coverage [email or web address] Password [email or web address] 11/12/2013, 4:04 PM   LOS: 5 days

## 2013-11-12 NOTE — Progress Notes (Signed)
Assessment:  1 Hyponatremia, drug induced, resolved 2 Hx recurrent lymphoma 4 AKI, oliguric- unclear cause. Up 9 kg from admission, on IVF's without improvement. No nephrotoxins. BP normal, UA abnormal w protein, rbc's and wbc's.  And platelets low, and LDH up 514 . Could have TTP. Will d/w primary. Ordered some serologies and ADAMTS 13 activity and haptoglobin. 5 AMS - no clear cause, worse than on admission. D/C all sedating meds including prn's, have d/w primary   Renal US 11 cm kidneys , no hydro, echogenic cortex  Subjective: Interval History: oliguric  Objective: Vital signs in last 24 hours: Temp:  [97.5 F (36.4 C)-98.5 F (36.9 C)] 97.5 F (36.4 C) (05/10 0745) Pulse Rate:  [84-104] 96 (05/10 0800) Resp:  [18-25] 25 (05/10 0800) BP: (106-155)/(63-76) 154/76 mmHg (05/10 0800) SpO2:  [94 %-98 %] 98 % (05/10 0800) Weight:  [63.4 kg (139 lb 12.4 oz)] 63.4 kg (139 lb 12.4 oz) (05/10 0500) Weight change:   Intake/Output from previous day: 05/09 0701 - 05/10 0700 In: 1751.7 [I.V.:1751.7] Out: 775 [Urine:775] Intake/Output this shift: Total I/O In: 75 [I.V.:75] Out: -   Gen: somnolent, difficult to arouse does not follow commands Resp: fine basilar rales bilat, o/w clear Cardio: regular rate and rhythm, S1, S2 normal, no murmur, click, rub or gallop Ext: mild edema bilat LE's Neuro: not oriented, diffusely weak but sedated from "sleeping pill" last night per family  Lab Results:  Recent Labs  11/10/13 0030 11/11/13 0500  WBC 12.5* 10.7*  HGB 10.6* 10.2*  HCT 29.6* 28.9*  PLT 111* 107*   BMET:   Recent Labs  11/10/13 1700 11/11/13 0500  NA 127* 130*  K 4.3 4.3  CL 96 100  CO2 18* 17*  GLUCOSE 117* 100*  BUN 62* 64*  CREATININE 2.10* 2.26*  CALCIUM 7.2* 7.1*   No results found for this basename: PTH,  in the last 72 hours Iron Studies: No results found for this basename: IRON, TIBC, TRANSFERRIN, FERRITIN,  in the last 72 hours Studies/Results: US  Renal  11/11/2013   CLINICAL DATA:  lymphoma and AKI  EXAM: RENAL/URINARY TRACT ULTRASOUND COMPLETE  COMPARISON:  CT 01/20/2012  FINDINGS: Right Kidney:  Length: 11.2 cm. Parenchyma echogenic compared to adjacent liver. No hydronephrosis.  Left Kidney:  Length: 11.3 cm. No hydronephrosis. Several small cysts, largest 20 x 17 mm in the lower pole.  Bladder:  Decompressed by Foley catheter.  There is a small amount of pelvic ascites identified.  11 mm gallstone with gallbladder wall thickening up to 5 mm.  IMPRESSION: 1. Negative for hydronephrosis. 2. Echogenic renal parenchyma, a nonspecific indicator of medical renal disease. 3. Cholelithiasis with gallbladder wall thickening. 4. Small amount of pelvic ascites.   Electronically Signed   By: Arne Cleveland M.D.   On: 11/11/2013 14:57   Scheduled: . multivitamin with minerals  1 tablet Oral Daily  . sodium chloride  10-40 mL Intracatheter Q12H     LOS: 5 days   Sandy Salaam Milanni Ayub 11/12/2013,10:18 AM

## 2013-11-12 NOTE — Consult Note (Signed)
[facility] [phone]   [phone]  CONSULT NOTE  REFERRING PHYSICIAN: Dr. Dia Crawford  REASON FOR CONSULTATION:  Anemia, thrombocytopenia,  and altered mental status rule out TTP  HPI Cindy Mccormick is a 80 y.o. female. With past medical history significant for lymphoma x3 recurrences last one was in 2013 treated with chemotherapy,  HTN, PVD,  diverticulitis, cardiomyopathy lives independently at home . On Sunday her neighbor noticed that she is not feeling well subsequent  her family was called in. Family noticed that she was having urinary issues and was taken to the urology and was given doxycycline. Family also says that she's been passing blood in the stool for the past few days. Her condition continued to deteriorate even after 3 doses of doxycycline with decrease in appetite and poor po intake.  Family noticed that her speech has become slurred ,wobbly gait,  slightly confused and vomited once subsequently she was brought here.   About 2 weeks ago she developed right hip pain and at that time she was given prednisone and it helped her a lot and she even went to the [facility]. Few days ago  she also developed lower back pain and a left hip pain.   At present  patient unable to communicate.  history obtained from the patient's family. Family says up until this event happened She used to live independently and drive car and take care of herself.    Past Medical History  Diagnosis Date  . LEFT BUNDLE BRANCH BLOCK   . HYPERTENSION   . HYPERLIPIDEMIA   . CARDIOMYOPATHY   . LYMPHOMA   . DIVERTICULITIS, COLON   . Hemorrhoids, internal   . H/O: hysterectomy 1977  . Maintenance chemotherapy     Past Surgical History  Procedure Laterality Date  . Appendectomy  1949  . Toe surgery    . Lymphoma biopsies      Family History  Problem Relation Age of Onset  . Lymphoma Mother   . Sudden death Father   . Hypertension Neg Hx   . Hyperlipidemia  Neg Hx   . Heart attack Neg Hx   . Diabetes Neg Hx     Social History History  Substance Use Topics  . Smoking status: Former Research scientist (life sciences)  . Smokeless tobacco: Not on file  . Alcohol Use: No    Allergies  Allergen Reactions  . Ezetimibe Other (See Comments)  . Hydrocodone-Acetaminophen Other (See Comments)  . Nitrofurantoin Other (See Comments)  . Other Itching and Rash    "chemo" medication given at [facility] in [place], [place]  . Oxycodone Hcl Other (See Comments)  . Sulfonamide Derivatives Other (See Comments)    Current Facility-Administered Medications  Medication Dose Route Frequency Provider Last Rate Last Dose  . 0.9 %  sodium chloride infusion   Intravenous Continuous Sol Blazing, MD 50 mL/hr at 11/12/13 1029    . carvedilol (COREG) tablet 3.125 mg  3.125 mg Oral BID WC Allie Bossier, MD      . multivitamin with minerals tablet 1 tablet  1 tablet Oral Daily Phillips Grout, MD   1 tablet at 11/12/13 0933  . piperacillin-tazobactam (ZOSYN) IVPB 2.25 g  2.25 g Intravenous 3 times per day Allie Bossier, MD      . sodium chloride 0.9 % injection 10-40 mL  10-40 mL Intracatheter Q12H Jonetta Osgood, MD   20 mL at 11/11/13 2117  . sodium chloride 0.9 %  injection 10-40 mL  10-40 mL Intracatheter PRN Jonetta Osgood, MD   10 mL at 11/09/13 0050    Review of Systems Not able to obtain complete review of systems .  symptoms obtained from patient's family are mentioned in history of present illness   Physical Exam  GENERAL: patient is an altered mental status.  moderately built and moderately nourished  SKIN: no rashes or significant lesions HEAD: Normocephalic, atraumatic EYES: PERRLA, EOMI, Conjunctiva are pink and non-injected, sclera clear EARS: External ears normal OROPHARYNX:no erythema, lips, buccal mucosa, and tongue normal and mucous membranes are moist  NECK: supple, no adenopathy, no JVD, no stridor, non-tender LYMPH:  no palpable lymphadenopathy,  no hepatosplenomegaly LUNGS: clear to auscultation , coarse sounds heard HEART: regular rate & rhythm ABDOMEN: soft, obese and normal bowel sounds EXTREMITIES:no edema, no clubbing and no cyanosis  NEURO:  patient is in altered mental status   LABORATORY DATA: Lab Results  Component Value Date   WBC 10.7* 11/11/2013   HGB 10.2* 11/11/2013   HCT 28.9* 11/11/2013   MCV 85.8 11/11/2013   PLT 107* 11/11/2013      RADIOGRAPHIC STUDIES: Ct Head Wo Contrast  11/07/2013   CLINICAL DATA:  Severe headache.  EXAM: CT HEAD WITHOUT CONTRAST  TECHNIQUE: Contiguous axial images were obtained from the base of the skull through the vertex without intravenous contrast.  COMPARISON:  None.  FINDINGS: Skull and Sinuses:No acute osseous findings. Clear paranasal sinuses.  Orbits: Unremarkable for age.  Brain: No evidence of acute abnormality, such as acute infarction, hemorrhage, hydrocephalus, or mass lesion/mass effect. Enlarged extra-axial spaces in the bilateral frontal and parietal region is related to atrophy given no mass effect to suggest hygroma. Low-attenuation area along the lower left sylvian fissure is most consistent with dilated perivascular space.  IMPRESSION: No acute intracranial abnormality.   Electronically Signed   By: Jorje Guild M.D.   On: 11/07/2013 23:04   Mr Brain Wo Contrast  11/10/2013   CLINICAL DATA:  Confusion.  CVA.  EXAM: MRI HEAD WITHOUT CONTRAST  TECHNIQUE: Multiplanar, multiecho pulse sequences of the brain and surrounding structures were obtained without intravenous contrast.  COMPARISON:  CT 11/07/2013  FINDINGS: Cerebral volume normal for age.  Negative for hydrocephalus.  Negative for acute or chronic infarct. Negative for demyelinating disease. Diffusion-weighted imaging is negative.  Negative for hemorrhage or fluid collection. Negative for mass or edema.  Mild mucosal thickening in the mastoid sinus bilaterally. Paranasal sinuses are clear.  IMPRESSION: Normal MRI of the brain  for age.  Mild mastoid sinus mucosal edema bilaterally.   Electronically Signed   By: Franchot Gallo M.D.   On: 11/10/2013 09:01   US Renal  11/11/2013   CLINICAL DATA:  lymphoma and AKI  EXAM: RENAL/URINARY TRACT ULTRASOUND COMPLETE  COMPARISON:  CT 01/20/2012  FINDINGS: Right Kidney:  Length: 11.2 cm. Parenchyma echogenic compared to adjacent liver. No hydronephrosis.  Left Kidney:  Length: 11.3 cm. No hydronephrosis. Several small cysts, largest 20 x 17 mm in the lower pole.  Bladder:  Decompressed by Foley catheter.  There is a small amount of pelvic ascites identified.  11 mm gallstone with gallbladder wall thickening up to 5 mm.  IMPRESSION: 1. Negative for hydronephrosis. 2. Echogenic renal parenchyma, a nonspecific indicator of medical renal disease. 3. Cholelithiasis with gallbladder wall thickening. 4. Small amount of pelvic ascites.   Electronically Signed   By: Arne Cleveland M.D.   On: 11/11/2013 14:57   Dg Chest [facility]  1 View  11/12/2013   CLINICAL DATA:  Acute mental status changes. Renal failure. Possible TTP. PICC.  EXAM: PORTABLE CHEST - 1 VIEW  COMPARISON:  DG CHEST 1V PORT dated 11/07/2013  FINDINGS: Cardiac silhouette upper normal in size to slightly enlarged but stable. Suboptimal inspiration, with worsening atelectasis in both lung bases. Pulmonary vascularity normal without evidence of pulmonary edema. Possible small bilateral pleural effusions. Right arm PICC tip projects over the lower SVC near the cavoatrial junction.  IMPRESSION: 1. Right arm PICC tip projects over the lower SVC at or near the cavoatrial junction. 2. Suboptimal inspiration with worsening atelectasis in the lower lobes. 3. Possible small bilateral pleural effusions. No evidence of pulmonary edema.   Electronically Signed   By: Evangeline Dakin M.D.   On: 11/12/2013 11:49   Dg Chest Port 1 View  11/07/2013   CLINICAL DATA:  Patient is confused, diagnosed with UTI yesterday  EXAM: PORTABLE CHEST - 1 VIEW  COMPARISON:  CT  CHEST-ABD-PELV W/ CM dated 09/08/2013  FINDINGS: There is left lower lobe airspace disease. There is no pleural effusion or pneumothorax. Stable cardiomediastinal silhouette. Thoracic aortic atherosclerosis. Unremarkable osseous structures.  IMPRESSION: Left lower lobe airspace disease which may reflect atelectasis versus is infiltrate.   Electronically Signed   By: Kathreen Devoid   On: 11/07/2013 22:34    ASSESSMENT/ PLAN: 80 years old pleasant female who is admitted with altered mental status, Hyponatremia, dehydration and UTI: Her CT of the brain as well as MRI of the brain was negative for any CVA or carcinomatosis. During the hospital course she was found to have anemia and also thrombocytopenia worsening renal functions and the concern for  TTP was entertained. I have reviewed the peripheral blood smear and it revealed 1 schistocyte/10 pf and also increase in bands and toxic granulation suggestive of infectious process and it is not consistent with TTP.    Her altered mental status condition could be multifactorial ( sepsis/hyponatremia /dehydration / rule out ?neurological etiologies and also rule out carcinomatosis secondary to lymphoma though unlikely). If her clinical condition does not improve after correction of hyponatremia , with appropriate antibiotics perhaps should consider LP for CSF analysis. Her EEG is pending. If her blood counts does not improve we'll also plan for bone marrow aspiration and biopsy to  assess for lymphoma recurrence.followup with the CT of the abdomen and pelvis without contrast which has been ordered today. Follow up with serum and urine protein electrophoresis and serum immunoglobulins ordered Closely monitor daily CBC and differential. If necessary we'll also order the peripheral blood flow cytometry  Thank you so much for allowing me to participate in the care of Chancy Milroy. I will continue to follow up the patient with you and assist in her care.   total time  spent : 60 minutes    [facility] 11/12/2013, 4:59 PM

## 2013-11-12 NOTE — Progress Notes (Signed)
Patient seen, examined, and I agree with the above documentation, including the assessment and plan. Hgb not checked today, but no further significant GI hemorrhage Expect resolved diverticular hemorrhage With ongoing evaluation for metabolic encephalopathy, will hold on colonoscopy for now.  Can be re-evaluated closer to time of discharge assuming MS improves Please call at that time or with questions.

## 2013-11-13 DIAGNOSIS — C8589 Other specified types of non-Hodgkin lymphoma, extranodal and solid organ sites: Secondary | ICD-10-CM

## 2013-11-13 LAB — CBC WITH DIFFERENTIAL/PLATELET
BASOS ABS: 0 10*3/uL (ref 0.0–0.1)
Basophils Relative: 0 % (ref 0–1)
Eosinophils Absolute: 0.2 10*3/uL (ref 0.0–0.7)
Eosinophils Relative: 1 % (ref 0–5)
HCT: 24.3 % — ABNORMAL LOW (ref 36.0–46.0)
HEMOGLOBIN: 8.3 g/dL — AB (ref 12.0–15.0)
LYMPHS PCT: 7 % — AB (ref 12–46)
Lymphs Abs: 0.7 10*3/uL (ref 0.7–4.0)
MCH: 30.1 pg (ref 26.0–34.0)
MCHC: 34.2 g/dL (ref 30.0–36.0)
MCV: 88 fL (ref 78.0–100.0)
MONOS PCT: 7 % (ref 3–12)
Monocytes Absolute: 0.8 10*3/uL (ref 0.1–1.0)
NEUTROS ABS: 8.8 10*3/uL — AB (ref 1.7–7.7)
Neutrophils Relative %: 85 % — ABNORMAL HIGH (ref 43–77)
Platelets: 151 10*3/uL (ref 150–400)
RBC: 2.76 MIL/uL — ABNORMAL LOW (ref 3.87–5.11)
RDW: 16.5 % — ABNORMAL HIGH (ref 11.5–15.5)
WBC: 10.4 10*3/uL (ref 4.0–10.5)

## 2013-11-13 LAB — KAPPA/LAMBDA LIGHT CHAINS
KAPPA, LAMDA LIGHT CHAIN RATIO: 1.19 (ref 0.26–1.65)
Kappa free light chain: 2.64 mg/dL — ABNORMAL HIGH (ref 0.33–1.94)
Lambda free light chains: 2.21 mg/dL (ref 0.57–2.63)

## 2013-11-13 LAB — COMPREHENSIVE METABOLIC PANEL
ALT: 14 U/L (ref 0–35)
AST: 25 U/L (ref 0–37)
Albumin: 1.7 g/dL — ABNORMAL LOW (ref 3.5–5.2)
Alkaline Phosphatase: 60 U/L (ref 39–117)
BUN: 66 mg/dL — AB (ref 6–23)
CALCIUM: 7.1 mg/dL — AB (ref 8.4–10.5)
CO2: 14 meq/L — AB (ref 19–32)
Chloride: 104 mEq/L (ref 96–112)
Creatinine, Ser: 2.39 mg/dL — ABNORMAL HIGH (ref 0.50–1.10)
GFR calc non Af Amer: 18 mL/min — ABNORMAL LOW (ref >90)
GFR, EST AFRICAN AMERICAN: 20 mL/min — AB (ref >90)
GLUCOSE: 87 mg/dL (ref 70–99)
Potassium: 4 mEq/L (ref 3.7–5.3)
Sodium: 134 mEq/L — ABNORMAL LOW (ref 137–147)
Total Bilirubin: 0.5 mg/dL (ref 0.3–1.2)
Total Protein: 3.9 g/dL — ABNORMAL LOW (ref 6.0–8.3)

## 2013-11-13 LAB — ANA: Anti Nuclear Antibody(ANA): POSITIVE — AB

## 2013-11-13 LAB — MAGNESIUM: Magnesium: 2.4 mg/dL (ref 1.5–2.5)

## 2013-11-13 LAB — ANTI-NUCLEAR AB-TITER (ANA TITER): ANA TITER 1: NEGATIVE (ref <1:40)

## 2013-11-13 LAB — MPO/PR-3 (ANCA) ANTIBODIES
Myeloperoxidase Abs: <1
Serine Protease 3: <1

## 2013-11-13 LAB — HAPTOGLOBIN: HAPTOGLOBIN: 61 mg/dL (ref 45–215)

## 2013-11-13 MED ORDER — SODIUM BICARBONATE 650 MG PO TABS
650.0000 mg | ORAL_TABLET | Freq: Three times a day (TID) | ORAL | Status: DC
  Administered 2013-11-13 – 2013-11-17 (×13): 650 mg via ORAL
  Filled 2013-11-13 (×16): qty 1

## 2013-11-13 NOTE — Progress Notes (Signed)
Called pt's son to inquire about bed preference for SNF, and son asked CSW to make referrals to SNFs in [facility]. CSW sent referrals, but facilities do not utilize electronic sharing system and CSW asked son to provide names of specific facilities and CSW will send information directly to these SNFs. CSW explained that pt's son would have to pay for ambulance transport if pt goes to [facility], or son would have to drive her himself. CSW e-mailed son list of facilities in [facility] that have short-term rehab departments. Son understanding of this requirement for transport. Son wishes to speak more with MD before pt is discharged to SNF, concerning "what type of rehab pt will need, specifically." CSW reiterated that when pt is medically ready for discharge we need to know which facility to arrange, and son states he will let CSW know which facility he would like pt to go to in [facility] as a back-up in the event a facility is unable to take her in [facility].   Ky Barban, MSW, [facility] Clinical Social Worker [phone]

## 2013-11-13 NOTE — Progress Notes (Signed)
Weekend CSW provided bed offers to patient and daughter-in-law on 05/09. CSW following for discharge.   Ky Barban, MSW, [facility] Clinical Social Worker [phone]

## 2013-11-13 NOTE — Progress Notes (Signed)
[facility] TEAM 1 - Stepdown/ICU TEAM Progress Note  Cindy Mccormick NFA:[id number] DOB: [birth year] DOA: 11/07/2013 PCP: Laurel Dimmer, MD  Admit HPI / Brief Narrative: 80 yo WF PMHx lymphoma, cardiomyopathy, HTN, PVD, hypotension, HLD, Hx diverticulitis.  Highly functional lives alone has h/o recurrrent lymphoma for over 10 years initially found by her urologist around one of her ureters (several recurrences and rounds of chemo being followed by [facility]), LBBB, htn, hld, cardiomyopathy last EF around 40%  Presented with several days of worsening confusion noticed by her son. About 2 weeks ago she started having lower back pain, went to see her orthopedic doctor and was put on a round of prednisone. Son says her back pain resolved and she felt wonderful on the prednisone. A couple days after the finishing the Prednisone lower back and flank pain began and she developed confusion. She was taken to her see her urologist who diagnosed her with a UTI and placed her on doxycycline. She took 2 doses of that but her symptoms worsened. She had not been eating or drinking well. The son went to check her and was concerned over how rapidly she had declined over < 24 hrs so he called EMS.At baseline she was normally is very alert and with sharp memory.  Evaluation in the ER revealed significant hyponatremia with a Na of 119. CXR was concerning for possible LLL airspace dz. CT Head showed no acute abnormality.  HPI/Subjective: Much more alert and appropriate than on this past Friday. Still confused but pleasant.  Assessment/Plan:  Hypotension  -Resolved with hydration and 2 units PRBC transfused on 5/7  -Cortisol >20  -Resumed Coreg 3.125 mg BID -Continue to hold Lisinopril secondary to patient's worsening renal function.  Altered mental status/unilateral weakness  -felt to be 2/2 hyponatremia primarily -previous unilateral weakness resolved - MRI normal  -now exhibiting sx's c/w acute  delirium  Hyponatremia  -On admission Na= 118 -was on desmopressin pre admit fro nocturnal enuresis -Renal consulted and gave 3% NS for several hours  -5/11 Na now stable and rising (137)  -Continue to follow BMP daily    Acute Rectal bleeding  -5/10 GI recommendations; No plans for colonoscopy at present, but consider closer to time of discharge or if mental status improves further -diet advanced and suspect if no further bleeding this admit can defer colonoscopy to OP setting  ABL anemia/acute thrombocytopenia  -baseline Hgb unclear- presented with Hgb 12 but (possible hemoconcentration) -hgb decr to 6.2 so was -5/7 transfused  2 units PRBCs   Recurrence of lymphoma? -CT abd/pelvis with no definitive changes to suggest recurrent lymphoma -Dr. Wilmon Arms (hematology/oncology) following.  Acute renal failure/dehydration  -5/9 baseline 35/1.19 but may have been dry at admit since "baseline" obtained at admit (elevated BUN could be reflective of GIB as well)  -Patient with worsening renal function will obtain urine electrolytes, urinalysis, ATN ?-Renal following -Continue IVF hydration normal saline 50 ml/hr -Renal ultrasound: no hydro but new finding of medical-renal dz  UTI (lower urinary tract infection)  -dx'd pre admit and was given doxy by PCP  -cx negative; antibiotics stopped   CAP (community acquired pneumonia)  -not hypoxic  -CXR LLL likely atelectasis and not infection so stopped anbx's previously but heme concerned peripheral smear c/w infection so began empiric anbx's over the WE (5/10) -Per Dr. Wilmon Arms (hematology/oncology)  Hx  LYMPHOMA  -followed at [place]  -dx'd 10 yrs prior  -low platelets probably due to acute GIB; stable but low  will obtain LDH and haptoglobin to r/o hemolytic process -C3 complement, C4 complement, SPEP/UPEP pending  HYPERLIPIDEMIA  -Obtain lipid panel  HYPERTENSION  -Restarted Coreg 3.125 BID-ACE I on hold 2/2 renal  fnx  Chronic systolic heart failure  -EF 40-45% per ECHO 2014  -diuretics and ACE I on hold due to ARF and dehydration/hypotension  -compensated -Admission bed weight= 54.8 kg, bed weight 5/8= 60.2 kg   PERIPHERAL VASCULAR DISEASE  Code Status: FULL Family Communication: Spoke with family at bedside at length Disposition Plan: Transfer to medical floor- PT/OT eval- ? SNF at [place]  Consultants: Dr. Ulice Dash Pyrtle (GI)  Dr. Adriana Mccallum (nephrology) Dr. Alexis Goodell (neurology) Dr. Wilmon Arms (hematology/oncology)  Culture 5/6 blood culture right hand/forearm NTD (pending) 5/5 urine culture negative (final)  Antibiotics: Rocephin 5/5 >>> 5/7  Zithromax 5/5 >> 5/7 Zosyn 5/10>> Daptomycin 5/10>>  DVT prophylaxis: SCDs  LINES / TUBES:  5/5 20 GA left antecubital 5/6 PICC/midline double-lumen right basilic  Continuous Infusions: . sodium chloride 50 mL/hr at 11/12/13 2354    Objective: VITAL SIGNS: Temp: 97.2 F (36.2 C) (05/11 0936) Temp src: Oral (05/11 0936) BP: 113/61 mmHg (05/11 0800) Pulse Rate: 91 (05/11 0936) SPO2; 96% room air FIO2:   Intake/Output Summary (Last 24 hours) at 11/13/13 1225 Last data filed at 11/13/13 1100  Gross per 24 hour  Intake   1677 ml  Output    900 ml  Net    777 ml   Exam: General: Alert and conversant though mildly confused  Lungs: Clear to auscultation bilaterally without wheezes or crackles Cardiovascular: Regular rate and rhythm without murmur gallop or rub normal S1 and S2 Abdomen: Nontender, nondistended, soft, bowel sounds positive, no rebound, no ascites, no appreciable mass Extremities: No significant cyanosis, clubbing, or edema bilateral lower extremities Neurologic: Awake and exam non focal- still confused  Data Reviewed: Basic Metabolic Panel:  Recent Labs Lab 11/10/13 0005 11/10/13 0558 11/10/13 1700 11/11/13 0500 11/13/13 0500  NA 128* 127* 127* 130* 134*  K 4.6 4.6 4.3 4.3 4.0  CL 95* 97 96 100  104  CO2 17* 16* 18* 17* 14*  GLUCOSE 91 99 117* 100* 87  BUN 57* 57* 62* 64* 66*  CREATININE 1.87* 1.97* 2.10* 2.26* 2.39*  CALCIUM 7.4* 7.1* 7.2* 7.1* 7.1*  MG  --   --   --   --  2.4   Liver Function Tests:  Recent Labs Lab 11/11/13 0500 11/13/13 0500  AST 26 25  ALT 11 14  ALKPHOS 53 60  BILITOT 0.4 0.5  PROT 3.9* 3.9*  ALBUMIN 1.7* 1.7*    Recent Labs Lab 11/12/13 0930  AMMONIA 20   CBC:  Recent Labs Lab 11/07/13 2125 11/08/13 0105  11/09/13 1220 11/10/13 0030 11/11/13 0500 11/12/13 1900 11/13/13 0500  WBC 9.3 10.3  < > 8.9 12.5* 10.7* 10.1 10.4  NEUTROABS 7.7 8.8*  --   --   --   --  8.7* 8.8*  HGB 12.5 12.2  < > 6.2* 10.6* 10.2* 9.2* 8.3*  HCT 35.2* 34.9*  < > 17.5* 29.6* 28.9* 26.3* 24.3*  MCV 90.0 91.4  < > 88.4 84.3 85.8 87.4 88.0  PLT 188 161  < > 102* 111* 107* 130* 151  < > = values in this interval not displayed.   Recent Results (from the past 240 hour(s))  URINE CULTURE     Status: None   Collection Time    11/07/13 10:01 PM  Result Value Ref Range Status   Specimen Description URINE, RANDOM   Final   Special Requests NONE   Final   Culture  Setup Time     Final   Value: 11/07/2013 22:29     Performed at [facility] Count     Final   Value: NO GROWTH     Performed at [facility]   Culture     Final   Value: NO GROWTH     Performed at [facility]   Report Status 11/08/2013 FINAL   Final  CULTURE, BLOOD (ROUTINE X 2)     Status: None   Collection Time    11/08/13 12:55 AM      Result Value Ref Range Status   Specimen Description BLOOD BLOOD RIGHT FOREARM   Final   Special Requests BOTTLES DRAWN AEROBIC ONLY 5CC   Final   Culture  Setup Time     Final   Value: 11/08/2013 08:53     Performed at [facility]   Culture     Final   Value:        BLOOD CULTURE RECEIVED NO GROWTH TO DATE CULTURE WILL BE HELD FOR 5 DAYS BEFORE ISSUING A FINAL NEGATIVE REPORT     Performed at [facility]   Report Status PENDING   Incomplete  CULTURE, BLOOD (ROUTINE X 2)     Status: None   Collection Time    11/08/13  1:05 AM      Result Value Ref Range Status   Specimen Description BLOOD RIGHT HAND   Final   Special Requests BOTTLES DRAWN AEROBIC ONLY 5CC   Final   Culture  Setup Time     Final   Value: 11/08/2013 08:54     Performed at [facility]   Culture     Final   Value:        BLOOD CULTURE RECEIVED NO GROWTH TO DATE CULTURE WILL BE HELD FOR 5 DAYS BEFORE ISSUING A FINAL NEGATIVE REPORT     Performed at [facility]   Report Status PENDING   Incomplete     Studies:  Recent x-ray studies have been reviewed in detail by the Attending Physician  Scheduled Meds:  Scheduled Meds: . carvedilol  3.125 mg Oral BID WC  . DAPTOmycin (CUBICIN)  IV  350 mg Intravenous Q24H  . multivitamin with minerals  1 tablet Oral Daily  . piperacillin-tazobactam (ZOSYN)  IV  2.25 g Intravenous 3 times per day  . sodium bicarbonate  650 mg Oral TID  . sodium chloride  10-40 mL Intracatheter Q12H    Time spent on care of this patient: 35 mins   Samella Parr , ANP   [facility] Office  [phone]  On-Call/Text Page:      [email or web address]      password [email or web address]  If 7PM-7AM, please contact night-coverage [email or web address] Password [email or web address] 11/13/2013, 12:25 PM   LOS: 6 days   I have personally examined this patient and reviewed the entire database. I have reviewed the above note, made any necessary editorial changes, and agree with its content.  Cherene Altes, MD [facility]

## 2013-11-13 NOTE — Progress Notes (Signed)
S: Pt voices no CO.  Mental status better according to son but not at baseline O:BP 133/54  Pulse 85  Temp(Src) 98.3 F (36.8 C) (Axillary)  Resp 15  Ht 5' 2"  (1.575 m)  Wt 63.4 kg (139 lb 12.4 oz)  BMI 25.56 kg/m2  SpO2 97%  Intake/Output Summary (Last 24 hours) at 11/13/13 0906 Last data filed at 11/13/13 0500  Gross per 24 hour  Intake 1553.25 ml  Output    900 ml  Net 653.25 ml   Weight change:  HAL:PFXTK and alert CVS:RRR Resp:clear Abd:+ BS NTND Ext:no edema NEURO:CNI Ox3.  Answers questions but not always correctly   . carvedilol  3.125 mg Oral BID WC  . DAPTOmycin (CUBICIN)  IV  350 mg Intravenous Q24H  . multivitamin with minerals  1 tablet Oral Daily  . piperacillin-tazobactam (ZOSYN)  IV  2.25 g Intravenous 3 times per day  . sodium chloride  10-40 mL Intracatheter Q12H   Ct Abdomen Pelvis Wo Contrast  11/12/2013   CLINICAL DATA:  History of lymphoma, anemia  EXAM: CT ABDOMEN AND PELVIS WITHOUT CONTRAST  TECHNIQUE: Multidetector CT imaging of the abdomen and pelvis was performed following the standard protocol without IV contrast.  COMPARISON:  09/08/2013  FINDINGS: Lung bases demonstrate bilateral lower lobe consolidation with large pleural effusions. These are new from the prior exam.  The liver is homogeneous in attenuation. A single rounded hypo dense focus is noted in the left lobe similar to that seen on the prior exam consistent with a small cyst. The gallbladder is well distended and demonstrates increased density in part due to multiple small gallstones as well as vicarious excretion contrast material. The spleen and pancreas are within normal limits. The adrenal glands are stable from the prior exam. Kidneys are well visualized without evidence renal calculi or obstructive changes. Multiple cystic lesions are again seen similar to that seen on the prior exam.  Diverticulosis without evidence of diverticulitis is noted. The bladder is decompressed by Foley  catheter. Some increased soft tissue density is noted in the presacral region which may be related to free fluid. The previously seen periaortic soft tissue density near the left kidney is stable in appearance from the prior exam. Diffuse aortic calcifications are seen. The appendix is not well visualized. No inflammatory changes are noted. Generalized changes of anasarca are seen. The osseous structures show diffuse osteopenia and degenerative change. No acute bony abnormality is seen.  IMPRESSION: Mild free fluid within the pelvis. There are changes of anasarca identified in the abdominal wall.  New bilateral pleural effusions with bilateral lower lobe consolidation.  Left hepatic cysts stable from the prior exam.  Stable changes in left periaortic region when compared with the prior study.  No definitive changes to suggest recurrent lymphoma are noted.   Electronically Signed   By: Inez Catalina M.D.   On: 11/12/2013 22:13   Dg Chest Port 1 View  11/12/2013   CLINICAL DATA:  Acute mental status changes. Renal failure. Possible TTP. PICC.  EXAM: PORTABLE CHEST - 1 VIEW  COMPARISON:  DG CHEST 1V PORT dated 11/07/2013  FINDINGS: Cardiac silhouette upper normal in size to slightly enlarged but stable. Suboptimal inspiration, with worsening atelectasis in both lung bases. Pulmonary vascularity normal without evidence of pulmonary edema. Possible small bilateral pleural effusions. Right arm PICC tip projects over the lower SVC near the cavoatrial junction.  IMPRESSION: 1. Right arm PICC tip projects over the lower SVC at or near the  cavoatrial junction. 2. Suboptimal inspiration with worsening atelectasis in the lower lobes. 3. Possible small bilateral pleural effusions. No evidence of pulmonary edema.   Electronically Signed   By: Evangeline Dakin M.D.   On: 11/12/2013 11:49   BMET    Component Value Date/Time   NA 134* 11/13/2013 0500   K 4.0 11/13/2013 0500   CL 104 11/13/2013 0500   CO2 14* 11/13/2013 0500    GLUCOSE 87 11/13/2013 0500   BUN 66* 11/13/2013 0500   CREATININE 2.39* 11/13/2013 0500   CALCIUM 7.1* 11/13/2013 0500   GFRNONAA 18* 11/13/2013 0500   GFRAA 20* 11/13/2013 0500   CBC    Component Value Date/Time   WBC 10.4 11/13/2013 0500   RBC 2.76* 11/13/2013 0500   RBC 3.16* 11/12/2013 1133   HGB 8.3* 11/13/2013 0500   HCT 24.3* 11/13/2013 0500   PLT 151 11/13/2013 0500   MCV 88.0 11/13/2013 0500   MCH 30.1 11/13/2013 0500   MCHC 34.2 11/13/2013 0500   RDW 16.5* 11/13/2013 0500   LYMPHSABS 0.7 11/13/2013 0500   MONOABS 0.8 11/13/2013 0500   EOSABS 0.2 11/13/2013 0500   BASOSABS 0.0 11/13/2013 0500     Assessment: 1. ARF, non oliguric.  There is not note but son tells me that hematologist has looked at blood smear and there were no schistocytes 2. Hyponatremia, resolved 3. AMS 4. Hx lymphoma 5. Low bicarb levels, presumably met acidosis  Plan: 1. Await rest of serologic WU 2. Start PO bicarb 3. Daily Scr   Windy Kalata

## 2013-11-13 NOTE — Progress Notes (Signed)
Hearing aid found under patient's bottom.

## 2013-11-13 NOTE — Progress Notes (Signed)
Subjective: Per son in the early morning hours patient awakened and was very talkative.  Is now more quiet but responding to questioning.    Objective: Current vital signs: BP 90/47  Pulse 85  Temp(Src) 97.8 F (36.6 C) (Oral)  Resp 20  Ht 5\' 2"  (1.575 m)  Wt 63.4 kg (139 lb 12.4 oz)  BMI 25.56 kg/m2  SpO2 100% Vital signs in last 24 hours: Temp:  [97.2 F (36.2 C)-98.3 F (36.8 C)] 97.8 F (36.6 C) (05/11 1300) Pulse Rate:  [85-92] 85 (05/11 1300) Resp:  [15-23] 20 (05/11 1300) BP: (90-149)/(47-79) 90/47 mmHg (05/11 1200) SpO2:  [95 %-100 %] 100 % (05/11 1300)  Intake/Output from previous day: 05/10 0701 - 05/11 0700 In: 1778.3 [P.O.:460; I.V.:1161.3; IV Piggyback:157] Out: 900 [Urine:900] Intake/Output this shift: Total I/O In: 400 [P.O.:200; I.V.:200] Out: 200 [Urine:200] Nutritional status: Bland  Neurologic Exam: Mental Status:  Alert, awake, follows only simple commands and then becomes inattentive. Verbal output is very reduced but comprehension appears to be appropriate.  Reports that she is at the [facility]  Cranial Nerves:  II: Discs flat bilaterally; Visual fields grossly normal, pupils equal, round, reactive to light  III,IV, VI: ptosis not present, extra-ocular motions intact bilaterally  V,VII: smile symmetric, facial light touch sensation normal bilaterally  VIII: hearing impaired bilaterally  IX,X: gag reflex present  XI: bilateral shoulder shrug  XII: midline tongue extension without atrophy or fasciculations  Motor:  Moves all limbs spontaneously and symmetrically  Tone and bulk:normal tone throughout; no atrophy noted  Sensory: no tested.  Deep Tendon Reflexes:  1+ throughout  Plantars:  Right: downgoing Left: downgoing   Lab Results: Basic Metabolic Panel:  Recent Labs Lab 11/10/13 0005 11/10/13 0558 11/10/13 1700 11/11/13 0500 11/13/13 0500  NA 128* 127* 127* 130* 134*  K 4.6 4.6 4.3 4.3 4.0  CL 95* 97 96 100 104  CO2 17* 16* 18*  17* 14*  GLUCOSE 91 99 117* 100* 87  BUN 57* 57* 62* 64* 66*  CREATININE 1.87* 1.97* 2.10* 2.26* 2.39*  CALCIUM 7.4* 7.1* 7.2* 7.1* 7.1*  MG  --   --   --   --  2.4    Liver Function Tests:  Recent Labs Lab 11/11/13 0500 11/13/13 0500  AST 26 25  ALT 11 14  ALKPHOS 53 60  BILITOT 0.4 0.5  PROT 3.9* 3.9*  ALBUMIN 1.7* 1.7*   No results found for this basename: LIPASE, AMYLASE,  in the last 168 hours  Recent Labs Lab 11/12/13 0930  AMMONIA 20    CBC:  Recent Labs Lab 11/07/13 2125 11/08/13 0105  11/09/13 1220 11/10/13 0030 11/11/13 0500 11/12/13 1900 11/13/13 0500  WBC 9.3 10.3  < > 8.9 12.5* 10.7* 10.1 10.4  NEUTROABS 7.7 8.8*  --   --   --   --  8.7* 8.8*  HGB 12.5 12.2  < > 6.2* 10.6* 10.2* 9.2* 8.3*  HCT 35.2* 34.9*  < > 17.5* 29.6* 28.9* 26.3* 24.3*  MCV 90.0 91.4  < > 88.4 84.3 85.8 87.4 88.0  PLT 188 161  < > 102* 111* 107* 130* 151  < > = values in this interval not displayed.  Cardiac Enzymes: No results found for this basename: CKTOTAL, CKMB, CKMBINDEX, TROPONINI,  in the last 168 hours  Lipid Panel:  Recent Labs Lab 11/11/13 1004  CHOL 147  TRIG 362*  HDL 25*  CHOLHDL 5.9  VLDL 72*  LDLCALC 50    CBG: No  results found for this basename: GLUCAP,  in the last 168 hours  Microbiology: Results for orders placed during the hospital encounter of 11/07/13  URINE CULTURE     Status: None   Collection Time    11/07/13 10:01 PM      Result Value Ref Range Status   Specimen Description URINE, RANDOM   Final   Special Requests NONE   Final   Culture  Setup Time     Final   Value: 11/07/2013 22:29     Performed at [facility]     Final   Value: NO GROWTH     Performed at [facility]   Culture     Final   Value: NO GROWTH     Performed at [facility]   Report Status 11/08/2013 FINAL   Final  CULTURE, BLOOD (ROUTINE X 2)     Status: None   Collection Time    11/08/13 12:55 AM      Result Value  Ref Range Status   Specimen Description BLOOD BLOOD RIGHT FOREARM   Final   Special Requests BOTTLES DRAWN AEROBIC ONLY 5CC   Final   Culture  Setup Time     Final   Value: 11/08/2013 08:53     Performed at [facility]   Culture     Final   Value:        BLOOD CULTURE RECEIVED NO GROWTH TO DATE CULTURE WILL BE HELD FOR 5 DAYS BEFORE ISSUING A FINAL NEGATIVE REPORT     Performed at [facility]   Report Status PENDING   Incomplete  CULTURE, BLOOD (ROUTINE X 2)     Status: None   Collection Time    11/08/13  1:05 AM      Result Value Ref Range Status   Specimen Description BLOOD RIGHT HAND   Final   Special Requests BOTTLES DRAWN AEROBIC ONLY 5CC   Final   Culture  Setup Time     Final   Value: 11/08/2013 08:54     Performed at [facility]   Culture     Final   Value:        BLOOD CULTURE RECEIVED NO GROWTH TO DATE CULTURE WILL BE HELD FOR 5 DAYS BEFORE ISSUING A FINAL NEGATIVE REPORT     Performed at [facility]   Report Status PENDING   Incomplete    Coagulation Studies:  Recent Labs  11/12/13 1133  LABPROT 15.5*  INR 1.26    Imaging: Ct Abdomen Pelvis Wo Contrast  11/12/2013   CLINICAL DATA:  History of lymphoma, anemia  EXAM: CT ABDOMEN AND PELVIS WITHOUT CONTRAST  TECHNIQUE: Multidetector CT imaging of the abdomen and pelvis was performed following the standard protocol without IV contrast.  COMPARISON:  09/08/2013  FINDINGS: Lung bases demonstrate bilateral lower lobe consolidation with large pleural effusions. These are new from the prior exam.  The liver is homogeneous in attenuation. A single rounded hypo dense focus is noted in the left lobe similar to that seen on the prior exam consistent with a small cyst. The gallbladder is well distended and demonstrates increased density in part due to multiple small gallstones as well as vicarious excretion contrast material. The spleen and pancreas are within normal limits. The adrenal glands are  stable from the prior exam. Kidneys are well visualized without evidence renal calculi or obstructive changes. Multiple cystic lesions are again seen similar to  that seen on the prior exam.  Diverticulosis without evidence of diverticulitis is noted. The bladder is decompressed by Foley catheter. Some increased soft tissue density is noted in the presacral region which may be related to free fluid. The previously seen periaortic soft tissue density near the left kidney is stable in appearance from the prior exam. Diffuse aortic calcifications are seen. The appendix is not well visualized. No inflammatory changes are noted. Generalized changes of anasarca are seen. The osseous structures show diffuse osteopenia and degenerative change. No acute bony abnormality is seen.  IMPRESSION: Mild free fluid within the pelvis. There are changes of anasarca identified in the abdominal wall.  New bilateral pleural effusions with bilateral lower lobe consolidation.  Left hepatic cysts stable from the prior exam.  Stable changes in left periaortic region when compared with the prior study.  No definitive changes to suggest recurrent lymphoma are noted.   Electronically Signed   By: Inez Catalina M.D.   On: 11/12/2013 22:13   Dg Chest Port 1 View  11/12/2013   CLINICAL DATA:  Acute mental status changes. Renal failure. Possible TTP. PICC.  EXAM: PORTABLE CHEST - 1 VIEW  COMPARISON:  DG CHEST 1V PORT dated 11/07/2013  FINDINGS: Cardiac silhouette upper normal in size to slightly enlarged but stable. Suboptimal inspiration, with worsening atelectasis in both lung bases. Pulmonary vascularity normal without evidence of pulmonary edema. Possible small bilateral pleural effusions. Right arm PICC tip projects over the lower SVC near the cavoatrial junction.  IMPRESSION: 1. Right arm PICC tip projects over the lower SVC at or near the cavoatrial junction. 2. Suboptimal inspiration with worsening atelectasis in the lower lobes. 3. Possible  small bilateral pleural effusions. No evidence of pulmonary edema.   Electronically Signed   By: Evangeline Dakin M.D.   On: 11/12/2013 11:49    Medications:  I have reviewed the patient's current medications. Scheduled: . carvedilol  3.125 mg Oral BID WC  . DAPTOmycin (CUBICIN)  IV  350 mg Intravenous Q24H  . multivitamin with minerals  1 tablet Oral Daily  . piperacillin-tazobactam (ZOSYN)  IV  2.25 g Intravenous 3 times per day  . sodium bicarbonate  650 mg Oral TID  . sodium chloride  10-40 mL Intracatheter Q12H    Assessment/Plan: Patient still not at baseline but does continue to have some metabolic issues.  EEG pending.  Recommendations: 1.  Will follow up results of EEG   LOS: 6 days   Alexis Goodell, MD [facility] [phone] 11/13/2013  2:38 PM

## 2013-11-13 NOTE — Progress Notes (Signed)
Cindy Mccormick   DOB:Jun 12, 1928   HK#:[id number]   VFI#:[id number]  Subjective:  Cindy Mccormick is more awake and alert today and verbalizing and follow simple commands.  Objective:  Filed Vitals:   11/13/13 1645  BP: 143/54  Pulse: 81  Temp:   Resp:     Body mass index is 25.56 kg/(m^2).  Intake/Output Summary (Last 24 hours) at 11/13/13 1718 Last data filed at 11/13/13 1100  Gross per 24 hour  Intake 1401.17 ml  Output    900 ml  Net 501.17 ml   GENERAL: patient is an altered mental status. moderately built and moderately nourished  SKIN: no rashes or significant lesions  HEAD: Normocephalic, atraumatic  EYES: PERRLA, EOMI, Conjunctiva are pink and non-injected, sclera clear  EARS: External ears normal  OROPHARYNX:no erythema, lips, buccal mucosa, and tongue normal and mucous membranes are moist  NECK: supple, no adenopathy, no JVD, no stridor, non-tender  LYMPH: no palpable lymphadenopathy, no hepatosplenomegaly  LUNGS: clear to auscultation , coarse sounds heard  HEART: regular rate & rhythm  ABDOMEN: soft, obese and normal bowel sounds  EXTREMITIES:no edema, no clubbing and no cyanosis  NEURO: Patient is more alert and awake today and follow simple commands   Labs:  Lab Results  Component Value Date   WBC 10.4 11/13/2013   HGB 8.3* 11/13/2013   HCT 24.3* 11/13/2013   MCV 88.0 11/13/2013   PLT 151 11/13/2013   NEUTROABS 8.8* 11/13/2013     Urine Studies No results found for this basename: UACOL, UAPR, USPG, UPH, UTP, UGL, UKET, UBIL, UHGB, UNIT, UROB, ULEU, UEPI, UWBC, URBC, UBAC, CAST, CRYS, UCOM, BILUA,  in the last 72 hours  Basic Metabolic Panel:  Recent Labs Lab 11/10/13 0005 11/10/13 0558 11/10/13 1700 11/11/13 0500 11/13/13 0500  NA 128* 127* 127* 130* 134*  K 4.6 4.6 4.3 4.3 4.0  CL 95* 97 96 100 104  CO2 17* 16* 18* 17* 14*  GLUCOSE 91 99 117* 100* 87  BUN 57* 57* 62* 64* 66*  CREATININE 1.87* 1.97* 2.10* 2.26* 2.39*  CALCIUM 7.4* 7.1* 7.2* 7.1* 7.1*   MG  --   --   --   --  2.4   GFR Estimated Creatinine Clearance: 15.3 ml/min (by C-G formula based on Cr of 2.39). Liver Function Tests:  Recent Labs Lab 11/11/13 0500 11/13/13 0500  AST 26 25  ALT 11 14  ALKPHOS 53 60  BILITOT 0.4 0.5  PROT 3.9* 3.9*  ALBUMIN 1.7* 1.7*   No results found for this basename: LIPASE, AMYLASE,  in the last 168 hours  Recent Labs Lab 11/12/13 0930  AMMONIA 20   Coagulation profile  Recent Labs Lab 11/12/13 1133  INR 1.26    CBC:  Recent Labs Lab 11/07/13 2125 11/08/13 0105  11/09/13 1220 11/10/13 0030 11/11/13 0500 11/12/13 1900 11/13/13 0500  WBC 9.3 10.3  < > 8.9 12.5* 10.7* 10.1 10.4  NEUTROABS 7.7 8.8*  --   --   --   --  8.7* 8.8*  HGB 12.5 12.2  < > 6.2* 10.6* 10.2* 9.2* 8.3*  HCT 35.2* 34.9*  < > 17.5* 29.6* 28.9* 26.3* 24.3*  MCV 90.0 91.4  < > 88.4 84.3 85.8 87.4 88.0  PLT 188 161  < > 102* 111* 107* 130* 151  < > = values in this interval not displayed. Cardiac Enzymes: No results found for this basename: CKTOTAL, CKMB, CKMBINDEX, TROPONINI,  in the last 168 hours BNP: No components found  with this basename: POCBNP,  CBG: No results found for this basename: GLUCAP,  in the last 168 hours D-Dimer No results found for this basename: DDIMER,  in the last 72 hours Hgb A1c No results found for this basename: HGBA1C,  in the last 72 hours Lipid Profile  Recent Labs  11/11/13 1004  CHOL 147  HDL 25*  LDLCALC 50  TRIG 362*  CHOLHDL 5.9   Thyroid function studies No results found for this basename: TSH, T4TOTAL, FREET3, T3FREE, THYROIDAB,  in the last 72 hours Anemia work up  Recent Labs  11/12/13 1133  RETICCTPCT 2.3   Microbiology Recent Results (from the past 240 hour(s))  URINE CULTURE     Status: None   Collection Time    11/07/13 10:01 PM      Result Value Ref Range Status   Specimen Description URINE, RANDOM   Final   Special Requests NONE   Final   Culture  Setup Time     Final   Value:  11/07/2013 22:29     Performed at [facility]     Final   Value: NO GROWTH     Performed at [facility]   Culture     Final   Value: NO GROWTH     Performed at [facility]   Report Status 11/08/2013 FINAL   Final  CULTURE, BLOOD (ROUTINE X 2)     Status: None   Collection Time    11/08/13 12:55 AM      Result Value Ref Range Status   Specimen Description BLOOD BLOOD RIGHT FOREARM   Final   Special Requests BOTTLES DRAWN AEROBIC ONLY 5CC   Final   Culture  Setup Time     Final   Value: 11/08/2013 08:53     Performed at [facility]   Culture     Final   Value:        BLOOD CULTURE RECEIVED NO GROWTH TO DATE CULTURE WILL BE HELD FOR 5 DAYS BEFORE ISSUING A FINAL NEGATIVE REPORT     Performed at [facility]   Report Status PENDING   Incomplete  CULTURE, BLOOD (ROUTINE X 2)     Status: None   Collection Time    11/08/13  1:05 AM      Result Value Ref Range Status   Specimen Description BLOOD RIGHT HAND   Final   Special Requests BOTTLES DRAWN AEROBIC ONLY 5CC   Final   Culture  Setup Time     Final   Value: 11/08/2013 08:54     Performed at [facility]   Culture     Final   Value:        BLOOD CULTURE RECEIVED NO GROWTH TO DATE CULTURE WILL BE HELD FOR 5 DAYS BEFORE ISSUING A FINAL NEGATIVE REPORT     Performed at [facility]   Report Status PENDING   Incomplete      Studies:  Ct Abdomen Pelvis Wo Contrast  11/12/2013   CLINICAL DATA:  History of lymphoma, anemia  EXAM: CT ABDOMEN AND PELVIS WITHOUT CONTRAST  TECHNIQUE: Multidetector CT imaging of the abdomen and pelvis was performed following the standard protocol without IV contrast.  COMPARISON:  09/08/2013  FINDINGS: Lung bases demonstrate bilateral lower lobe consolidation with large pleural effusions. These are new from the prior exam.  The liver is homogeneous in attenuation. A single rounded hypo dense focus is noted  in the left lobe similar to  that seen on the prior exam consistent with a small cyst. The gallbladder is well distended and demonstrates increased density in part due to multiple small gallstones as well as vicarious excretion contrast material. The spleen and pancreas are within normal limits. The adrenal glands are stable from the prior exam. Kidneys are well visualized without evidence renal calculi or obstructive changes. Multiple cystic lesions are again seen similar to that seen on the prior exam.  Diverticulosis without evidence of diverticulitis is noted. The bladder is decompressed by Foley catheter. Some increased soft tissue density is noted in the presacral region which may be related to free fluid. The previously seen periaortic soft tissue density near the left kidney is stable in appearance from the prior exam. Diffuse aortic calcifications are seen. The appendix is not well visualized. No inflammatory changes are noted. Generalized changes of anasarca are seen. The osseous structures show diffuse osteopenia and degenerative change. No acute bony abnormality is seen.  IMPRESSION: Mild free fluid within the pelvis. There are changes of anasarca identified in the abdominal wall.  New bilateral pleural effusions with bilateral lower lobe consolidation.  Left hepatic cysts stable from the prior exam.  Stable changes in left periaortic region when compared with the prior study.  No definitive changes to suggest recurrent lymphoma are noted.   Electronically Signed   By: Inez Catalina M.D.   On: 11/12/2013 22:13   Dg Chest Port 1 View  11/12/2013   CLINICAL DATA:  Acute mental status changes. Renal failure. Possible TTP. PICC.  EXAM: PORTABLE CHEST - 1 VIEW  COMPARISON:  DG CHEST 1V PORT dated 11/07/2013  FINDINGS: Cardiac silhouette upper normal in size to slightly enlarged but stable. Suboptimal inspiration, with worsening atelectasis in both lung bases. Pulmonary vascularity normal without evidence of pulmonary edema. Possible  small bilateral pleural effusions. Right arm PICC tip projects over the lower SVC near the cavoatrial junction.  IMPRESSION: 1. Right arm PICC tip projects over the lower SVC at or near the cavoatrial junction. 2. Suboptimal inspiration with worsening atelectasis in the lower lobes. 3. Possible small bilateral pleural effusions. No evidence of pulmonary edema.   Electronically Signed   By: Evangeline Dakin M.D.   On: 11/12/2013 11:49    ASSESSMENT/ PLAN:  80 years old pleasant female who is admitted with altered mental status, Hyponatremia, dehydration and UTI:  Her CT of the brain as well as MRI of the brain was negative for any CVA or carcinomatosis. During the hospital course she was found to have anemia and also thrombocytopenia worsening renal functions and the concern for TTP was entertained.  11/12/2013:  I have reviewed the peripheral blood smear and it revealed 1 schistocyte/10 pf and also increase in bands and toxic granulation suggestive of infectious process and it is not consistent with thrombotic microangiopathy  Her altered mental status condition could be multifactorial ( sepsis/hyponatremia /dehydration / rule out ?neurological etiologies and also rule out carcinomatosis secondary to lymphoma though unlikely).   She appears to be clinically better today when compared to yesterday . Continue IV fluids and IV antiemetics .I have reviewed her CT scan of the abdomen and pelvis and that does not reveal any radiological evidence of lymphoma recurrence. Her platelet count normalized today. Her Anemia could be related to renal etiology .We'll obtain iron indices and ferritin level  EEG is pending. Follow up with serum and urine protein electrophoresis and serum immunoglobulins ordered   We  will continue to follow up the patient with you and assist in her care.   Wilmon Arms, MD Hematology/medical oncology 11/13/2013  5:18 PM

## 2013-11-13 NOTE — Progress Notes (Signed)
Called report to Dora RN on [facility]. Pt will be transferred by night shift to [phone]

## 2013-11-13 NOTE — Progress Notes (Signed)
Pt transferred to the unit. Pt is stable, alert and oriented per baseline. Son oriented to room, staff, and call bell. Educated to call for any assistance. Bed in lowest position, call bell within reach- will continue to monitor.

## 2013-11-13 NOTE — Progress Notes (Signed)
Son states that the moment his mother arrived to the floor, he realized that one of her hearing aids were missing. Son told RN that he wants to go back downstairs to the prior unit to look around and the ask the nurse. Son has gone downstairs to check.

## 2013-11-13 NOTE — Progress Notes (Signed)
OT Discharge Note  Patient Details Name: Cindy Mccormick MRN: [id number] DOB: [birth year]   Cancelled Treatment:    Reason Eval/Treat Not Completed: Other (Splint order- No splinting needs found at this time by therapist) OT to sign off acutely due to no needs to be addressed with a splint order. Please reorder OT for evaluation if appropriate to address ADLS.  [name] Pager: [phone]  11/13/2013, 11:20 AM

## 2013-11-14 ENCOUNTER — Inpatient Hospital Stay (HOSPITAL_COMMUNITY): Payer: Medicare Other

## 2013-11-14 DIAGNOSIS — N179 Acute kidney failure, unspecified: Secondary | ICD-10-CM | POA: Diagnosis present

## 2013-11-14 LAB — PROTEIN ELECTROPHORESIS, SERUM
ALPHA-1-GLOBULIN: 13.6 % — AB (ref 2.9–4.9)
Albumin ELP: 53.3 % — ABNORMAL LOW (ref 55.8–66.1)
Alpha-2-Globulin: 11.6 % (ref 7.1–11.8)
BETA 2: 3.8 % (ref 3.2–6.5)
Beta Globulin: 7.1 % (ref 4.7–7.2)
GAMMA GLOBULIN: 10.6 % — AB (ref 11.1–18.8)
M-Spike, %: NOT DETECTED g/dL
Total Protein ELP: 4.2 g/dL — ABNORMAL LOW (ref 6.0–8.3)

## 2013-11-14 LAB — CBC WITH DIFFERENTIAL/PLATELET
Basophils Absolute: 0 10*3/uL (ref 0.0–0.1)
Basophils Relative: 0 % (ref 0–1)
EOS ABS: 0.1 10*3/uL (ref 0.0–0.7)
EOS PCT: 1 % (ref 0–5)
HEMATOCRIT: 23.6 % — AB (ref 36.0–46.0)
HEMOGLOBIN: 8.3 g/dL — AB (ref 12.0–15.0)
LYMPHS ABS: 0.5 10*3/uL — AB (ref 0.7–4.0)
LYMPHS PCT: 6 % — AB (ref 12–46)
MCH: 30.7 pg (ref 26.0–34.0)
MCHC: 35.2 g/dL (ref 30.0–36.0)
MCV: 87.4 fL (ref 78.0–100.0)
MONO ABS: 0.4 10*3/uL (ref 0.1–1.0)
MONOS PCT: 5 % (ref 3–12)
Neutro Abs: 8.3 10*3/uL — ABNORMAL HIGH (ref 1.7–7.7)
Neutrophils Relative %: 89 % — ABNORMAL HIGH (ref 43–77)
Platelets: 218 10*3/uL (ref 150–400)
RBC: 2.7 MIL/uL — AB (ref 3.87–5.11)
RDW: 16.5 % — ABNORMAL HIGH (ref 11.5–15.5)
WBC: 9.4 10*3/uL (ref 4.0–10.5)

## 2013-11-14 LAB — COMPREHENSIVE METABOLIC PANEL
ALT: 15 U/L (ref 0–35)
AST: 22 U/L (ref 0–37)
Albumin: 1.6 g/dL — ABNORMAL LOW (ref 3.5–5.2)
Alkaline Phosphatase: 58 U/L (ref 39–117)
BUN: 63 mg/dL — AB (ref 6–23)
CHLORIDE: 105 meq/L (ref 96–112)
CO2: 17 mEq/L — ABNORMAL LOW (ref 19–32)
CREATININE: 2.44 mg/dL — AB (ref 0.50–1.10)
Calcium: 7.1 mg/dL — ABNORMAL LOW (ref 8.4–10.5)
GFR calc Af Amer: 20 mL/min — ABNORMAL LOW (ref >90)
GFR calc non Af Amer: 17 mL/min — ABNORMAL LOW (ref >90)
Glucose, Bld: 107 mg/dL — ABNORMAL HIGH (ref 70–99)
Potassium: 3.8 mEq/L (ref 3.7–5.3)
Sodium: 135 mEq/L — ABNORMAL LOW (ref 137–147)
TOTAL PROTEIN: 4.2 g/dL — AB (ref 6.0–8.3)
Total Bilirubin: 0.4 mg/dL (ref 0.3–1.2)

## 2013-11-14 LAB — CULTURE, BLOOD (ROUTINE X 2)
CULTURE: NO GROWTH
Culture: NO GROWTH

## 2013-11-14 LAB — IGG, IGA, IGM
IGG (IMMUNOGLOBIN G), SERUM: 267 mg/dL — AB (ref 690–1700)
IgA: 67 mg/dL — ABNORMAL LOW (ref 69–380)
IgM, Serum: 100 mg/dL (ref 52–322)

## 2013-11-14 LAB — C3 COMPLEMENT: C3 Complement: 131 mg/dL (ref 90–180)

## 2013-11-14 LAB — UIFE/LIGHT CHAINS/TP QN, 24-HR UR
ALPHA 2 UR: DETECTED — AB
Albumin, U: DETECTED
Alpha 1, Urine: DETECTED — AB
BETA UR: DETECTED — AB
FREE KAPPA LT CHAINS, UR: 21 mg/dL — AB (ref 0.14–2.42)
FREE LAMBDA LT CHAINS, UR: 5.92 mg/dL — AB (ref 0.02–0.67)
Free Kappa/Lambda Ratio: 3.55 ratio (ref 2.04–10.37)
GAMMA UR: DETECTED — AB
Total Protein, Urine: 317.2 mg/dL

## 2013-11-14 LAB — C4 COMPLEMENT: COMPLEMENT C4, BODY FLUID: 20 mg/dL (ref 10–40)

## 2013-11-14 LAB — ADAMTS13 ACTIVITY: ADAMTS 13 ACTIVITY: 29 %{activity} — AB (ref 68–163)

## 2013-11-14 MED ORDER — ENSURE COMPLETE PO LIQD
237.0000 mL | Freq: Two times a day (BID) | ORAL | Status: DC
  Administered 2013-11-14 – 2013-11-15 (×2): 237 mL via ORAL

## 2013-11-14 NOTE — Procedures (Signed)
ELECTROENCEPHALOGRAM REPORT   Patient: Cindy Mccormick       Room #: 6P95 EEG No. ID: [id number] Age: 80 y.o.        Sex: female Referring Physician: Ghimire Report Date:  11/14/2013        Interpreting Physician: Alexis Goodell  History: Cindy Mccormick is an 80 y.o. female with altered mental status  Medications:  Scheduled: . carvedilol  3.125 mg Oral BID WC  . feeding supplement (ENSURE COMPLETE)  237 mL Oral BID BM  . multivitamin with minerals  1 tablet Oral Daily  . sodium bicarbonate  650 mg Oral TID  . sodium chloride  10-40 mL Intracatheter Q12H    Conditions of Recording:  This is a 16 channel EEG carried out with the patient in the drowsy state.  Description:  The background activity is slow and poorly organized consisting mostly of theta activity with some intermixed polymorphic delta activity as well.  This activity is continuous.  It is diffusely distributed.  No epileptiform activity is noted.   Despite stimulation there was no significant change in the background activity.  Hyperventilation and intermittent photic stimulation were not performed.  IMPRESSION: This is an abnormal EEG secondary to general background slowing.  This finding may be seen with a diffuse disturbance that is etiologically nonspecific, but may include a metabolic encephalopathy, among other possibilities.  No epileptiform activity was noted.     Alexis Goodell, MD [facility] [phone] 11/14/2013, 6:24 PM

## 2013-11-14 NOTE — Progress Notes (Signed)
Patient's son asked to hold off on neuro checks if patient is sleeping. Will continue to monitor

## 2013-11-14 NOTE — Progress Notes (Signed)
[facility] PROGRESS NOTE  Cindy Mccormick [id number] DOB: 02-09-1928 DOA: 11/07/2013 PCP: Laurel Dimmer, MD  Assessment/Plan Principal Problem:   Altered mental status Active Problems:  Altered Mental Status Multifactorial:  Infection plus metabolic abnormalities (hyponatremia),  Acute renal failure, +/- underlying early dementia MRI and CT Head are negative Neurology consulted. EEG completed, result pending. Significantly improved than just a few days back Son understands patient will need 24 hour care.  He is debating whether to take her home or SNF on discharge.  Hyponatremia   Resolving nicely. Secondary to DDAVP Na 122 at admission (range 118-134), currently 135 DDAVP for nocturnal enuresis discontinued Improved with minimal hypertonic saline and current NaCl 0.9% Follow BMP daily  Acute kidney injury with metabolic acidosis Probably from dehydration, acute illness UA trending increased protein, ketones and large Hgb, Urine Osmolality 316, raised to 337, Urine Na 20-23, Protein-Cr ration: 6.45.  sCr trending up, now 2.44 from 1.16 on admission Nephrology following.  Lisinopril held. Renal U/S - shows indicator of medical renal disease Continue IVF, BMP daily   UTI Initially Dx on 5/4 Outpatient, placed on Doxycycline, brought to the ED for lack of improvement in mental status UA-not overtly suspicious for infection, UC- no growth Initially on Doxy outpatient, Rocephin and Azithromycin discontinued Resolved  Thrombocytopenia Platelets increasing.  218 on 5/12 Patient noted to have anemia with worsening thrombocytopenia. Hematology consulted.  Antibiotics discontinued.  Appreciate Hem/onc consultation Protein ELP.  No M spike detected. Per hematology, anemia and cytopenia not secondary to thrombotic microangiopathy  CAP - Doubt patient actually had pneumonia. Suspect more of acute lung injury/ARDS secondary to gram-negative bacteremia Initial CXR  suspicious for atelectasis vs infiltrate (LLL) Asymptomatic.  No hypoxia.  Afebrile since admission WBC has normalized.  Diverticular/Lower GI bleed Pt had small amts of blood in her stool on 5/7 - 5/8.  This has resolved. [facility] consulted-feels is diverticular in origin Received 2 units of packed RBC on 5/7. Resolved, Re-evaluate once stable for colonoscopy- may need to reconsult gastroenterology just before discharge or schedule outpatient colonoscopy.  Recurrence of lymphoma? -followed at [facility] abd/pelvis with no definitive changes to suggest recurrent lymphoma  -Dr. Wilmon Arms (hematology/oncology) following. No indication of recurrence at this time. -dx'd 10 yrs prior   Chronic systolic heart failure  -EF 40-45% per ECHO 2014  -diuretics and ACE I on hold due to ARF and dehydration/hypotension  -compensated  -Admission bed weight= 54.8 kg, bed weight 5/8= 60.2 kg  Hypertension / Hypotension. Patient was hypotensive on admission  Has since resolved. Coreg restarted.  Continue to hold lisinopril due to ARF.   Hyperlipidemia Chronic,  elevated triglycerides (362), and VLDL, low HDL on panel 11/11/13.   Will ask PCP to address when acute illness has resolved.   Code Status: FULL Family Communication: Son  Disposition Plan: D/C to son's home with Full Home health services.   Consultants:  GI  Neurology  Nephrology  Hematology   Procedures:  EEG.  Results pending  Anti-infectives   Start     Dose/Rate Route Frequency Ordered Stop   11/12/13 1800  piperacillin-tazobactam (ZOSYN) IVPB 2.25 g  Status:  Discontinued     2.25 g 100 mL/hr over 30 Minutes Intravenous 3 times per day 11/12/13 1648 11/13/13 1940   11/12/13 1800  DAPTOmycin (CUBICIN) 350 mg in sodium chloride 0.9 % IVPB  Status:  Discontinued     350 mg 214 mL/hr over 30 Minutes Intravenous Every 24 hours 11/12/13  1709 11/13/13 1940   11/08/13 2200  cefTRIAXone (ROCEPHIN) 1 g in dextrose 5 %  50 mL IVPB  Status:  Discontinued     1 g 100 mL/hr over 30 Minutes Intravenous Every 24 hours 11/08/13 0030 11/10/13 0942   11/08/13 2200  azithromycin (ZITHROMAX) 500 mg in dextrose 5 % 250 mL IVPB  Status:  Discontinued     500 mg 250 mL/hr over 60 Minutes Intravenous Every 24 hours 11/08/13 0030 11/10/13 0942   11/07/13 2330  cefTRIAXone (ROCEPHIN) 1 g in dextrose 5 % 50 mL IVPB     1 g 100 mL/hr over 30 Minutes Intravenous  Once 11/07/13 2322 11/08/13 0006   11/07/13 2330  azithromycin (ZITHROMAX) 500 mg in dextrose 5 % 250 mL IVPB     500 mg 250 mL/hr over 60 Minutes Intravenous  Once 11/07/13 2322 11/08/13 0106      HPI 80 yo WF PMHx lymphoma, cardiomyopathy, HTN, PVD, hypotension, HLD, Hx diverticulitis. Prior to recent illness pt was highly functional lives alone has h/o recurrrent lymphoma for over 10 years initially found by her urologist around one of her ureters (several recurrences and rounds of chemo being followed by [facility]), LBBB, htn, hld, cardiomyopathy last EF around 40% Presented with several days of worsening confusion noticed by her son. About 2 weeks ago she started having lower back pain, went to see her orthopedic doctor and was put on a round of prednisone. Son says her back pain resolved and she felt wonderful on the prednisone. A couple days after the finishing the Prednisone lower back and flank pain began and she developed confusion. She was taken to her see her urologist who diagnosed her with a UTI and placed her on doxycycline. She took 2 doses of that but her symptoms worsened. She had not been eating or drinking well. The son went to check her and was concerned over how rapidly she had declined over < 24 hrs so he called EMS.At baseline she was normally is very alert and with sharp memory.   Evaluation in the ER revealed significant hyponatremia with a Na of 119. CXR was concerning for possible LLL airspace dz. CT Head showed no acute abnormality. During admission  pt had some dark stool and passing of clots and fluctuation mental status with expressive aphasia  Subjective: Pt is slightly confused, talking about the spiritual services provided.  Objective: Filed Vitals:   11/14/13 1316  BP: 106/63  Pulse: 87  Temp: 98.1 F (36.7 C)  Resp: 18    Intake/Output Summary (Last 24 hours) at 11/14/13 1409 Last data filed at 11/14/13 8466  Gross per 24 hour  Intake    677 ml  Output   1150 ml  Net   -473 ml   Filed Weights   11/11/13 0814 11/12/13 0500 11/14/13 0134  Weight: 64.6 kg (142 lb 6.7 oz) 63.4 kg (139 lb 12.4 oz) 65.1 kg (143 lb 8.3 oz)    Exam:   General:  NAD, awake and engaged with family and caretakers, confused.  Son at bedside.  Cardiovascular: RRR, no m/r/h  Respiratory: BSCE, symmetric chest wall movement  Abdomen: Soft, non-tender, +BS, No masses evident  Musculoskeletal: No swelling, cyanosis.  2+ min assist.  Neuro:  equal but weak grip and strength bilaterally in extremities  Data Reviewed: Basic Metabolic Panel:  Recent Labs Lab 11/10/13 0558 11/10/13 1700 11/11/13 0500 11/13/13 0500 11/14/13 1135  NA 127* 127* 130* 134* 135*  K 4.6 4.3 4.3  4.0 3.8  CL 97 96 100 104 105  CO2 16* 18* 17* 14* 17*  GLUCOSE 99 117* 100* 87 107*  BUN 57* 62* 64* 66* 63*  CREATININE 1.97* 2.10* 2.26* 2.39* 2.44*  CALCIUM 7.1* 7.2* 7.1* 7.1* 7.1*  MG  --   --   --  2.4  --    Liver Function Tests:  Recent Labs Lab 11/11/13 0500 11/13/13 0500 11/14/13 1135  AST 26 25 22   ALT 11 14 15   ALKPHOS 53 60 58  BILITOT 0.4 0.5 0.4  PROT 3.9* 3.9* 4.2*  ALBUMIN 1.7* 1.7* 1.6*    Recent Labs Lab 11/12/13 0930  AMMONIA 20   CBC:  Recent Labs Lab 11/07/13 2125 11/08/13 0105  11/10/13 0030 11/11/13 0500 11/12/13 1900 11/13/13 0500 11/14/13 1135  WBC 9.3 10.3  < > 12.5* 10.7* 10.1 10.4 9.4  NEUTROABS 7.7 8.8*  --   --   --  8.7* 8.8* 8.3*  HGB 12.5 12.2  < > 10.6* 10.2* 9.2* 8.3* 8.3*  HCT 35.2* 34.9*  < >  29.6* 28.9* 26.3* 24.3* 23.6*  MCV 90.0 91.4  < > 84.3 85.8 87.4 88.0 87.4  PLT 188 161  < > 111* 107* 130* 151 218  < > = values in this interval not displayed.   Recent Results (from the past 240 hour(s))  URINE CULTURE     Status: None   Collection Time    11/07/13 10:01 PM      Result Value Ref Range Status   Specimen Description URINE, RANDOM   Final   Special Requests NONE   Final   Culture  Setup Time     Final   Value: 11/07/2013 22:29     Performed at [facility]     Final   Value: NO GROWTH     Performed at [facility]   Culture     Final   Value: NO GROWTH     Performed at [facility]   Report Status 11/08/2013 FINAL   Final  CULTURE, BLOOD (ROUTINE X 2)     Status: None   Collection Time    11/08/13 12:55 AM      Result Value Ref Range Status   Specimen Description BLOOD BLOOD RIGHT FOREARM   Final   Special Requests BOTTLES DRAWN AEROBIC ONLY 5CC   Final   Culture  Setup Time     Final   Value: 11/08/2013 08:53     Performed at [facility]   Culture     Final   Value: NO GROWTH 5 DAYS     Performed at [facility]   Report Status 11/14/2013 FINAL   Final  CULTURE, BLOOD (ROUTINE X 2)     Status: None   Collection Time    11/08/13  1:05 AM      Result Value Ref Range Status   Specimen Description BLOOD RIGHT HAND   Final   Special Requests BOTTLES DRAWN AEROBIC ONLY 5CC   Final   Culture  Setup Time     Final   Value: 11/08/2013 08:54     Performed at [facility]   Culture     Final   Value: NO GROWTH 5 DAYS     Performed at [facility]   Report Status 11/14/2013 FINAL   Final     Studies: US Renal RENAL/URINARY TRACT ULTRASOUND COMPLETE  IMPRESSION: 1. Negative for  hydronephrosis. 2. Echogenic renal parenchyma, a nonspecific indicator of medical renal disease. 3. Cholelithiasis with gallbladder wall thickening. 4. Small amount of pelvic ascites. Electronically Signed By:  Arne Cleveland M.D. On: 11/11/2013 14:57   Ct Abdomen Pelvis Wo Contrast  11/12/2013   CLINICAL DATA:  History of lymphoma, anemia  EXAM: CT ABDOMEN AND PELVIS WITHOUT CONTRAST  TECHNIQUE: Multidetector CT imaging of the abdomen and pelvis was performed following the standard protocol without IV contrast.  COMPARISON:  09/08/2013  FINDINGS: Lung bases demonstrate bilateral lower lobe consolidation with large pleural effusions. These are new from the prior exam.  The liver is homogeneous in attenuation. A single rounded hypo dense focus is noted in the left lobe similar to that seen on the prior exam consistent with a small cyst. The gallbladder is well distended and demonstrates increased density in part due to multiple small gallstones as well as vicarious excretion contrast material. The spleen and pancreas are within normal limits. The adrenal glands are stable from the prior exam. Kidneys are well visualized without evidence renal calculi or obstructive changes. Multiple cystic lesions are again seen similar to that seen on the prior exam.  Diverticulosis without evidence of diverticulitis is noted. The bladder is decompressed by Foley catheter. Some increased soft tissue density is noted in the presacral region which may be related to free fluid. The previously seen periaortic soft tissue density near the left kidney is stable in appearance from the prior exam. Diffuse aortic calcifications are seen. The appendix is not well visualized. No inflammatory changes are noted. Generalized changes of anasarca are seen. The osseous structures show diffuse osteopenia and degenerative change. No acute bony abnormality is seen.  IMPRESSION: Mild free fluid within the pelvis. There are changes of anasarca identified in the abdominal wall.  New bilateral pleural effusions with bilateral lower lobe consolidation.  Left hepatic cysts stable from the prior exam.  Stable changes in left periaortic region when compared with  the prior study.  No definitive changes to suggest recurrent lymphoma are noted.   Electronically Signed   By: Inez Catalina M.D.   On: 11/12/2013 22:13    Scheduled Meds: . carvedilol  3.125 mg Oral BID WC  . feeding supplement (ENSURE COMPLETE)  237 mL Oral BID BM  . multivitamin with minerals  1 tablet Oral Daily  . sodium bicarbonate  650 mg Oral TID  . sodium chloride  10-40 mL Intracatheter Q12H   Continuous Infusions: . sodium chloride 50 mL/hr at 11/12/13 2354    Principal Problem:   Altered mental status Active Problems:   Hyponatremia   Acute renal failure   LYMPHOMA   HYPERLIPIDEMIA   HYPERTENSION   CARDIOMYOPATHY   UNSPECIFIED PERIPHERAL VASCULAR DISEASE   UTI (lower urinary tract infection)   CAP (community acquired pneumonia)   Rectal bleeding   Acute right-sided weakness   Hypotension   Thrombocytopenia, unspecified    Time spent: 35 min.    Myrla Halsted, PA-S  Imogene Burn, PA-C [facility] Pager [phone]. If 7PM-7AM, please contact night-coverage at [email or web address], password [facility] 11/14/2013, 2:09 PM  LOS: 7 days   Attending Patient seen and examined, agree with the above assessment and plan. Mental status slowly improving, renal function deteriorating further. Sodium is normalized. Hemoglobin and platelet count remain stable. Continue current supportive care. Suspect will need SNF on discharge.  Nena Alexander MD

## 2013-11-14 NOTE — Progress Notes (Signed)
S: Sitting up eating her meal O:BP 132/82  Pulse 116  Temp(Src) 98 F (36.7 C) (Oral)  Resp 18  Ht 5' 2"  (1.575 m)  Wt 65.1 kg (143 lb 8.3 oz)  BMI 26.24 kg/m2  SpO2 96%  Intake/Output Summary (Last 24 hours) at 11/14/13 1125 Last data filed at 11/14/13 0605  Gross per 24 hour  Intake    917 ml  Output   1300 ml  Net   -383 ml   Weight change:  DUK:GURKY and alert CVS:RRR Resp:clear Abd:+ BS NTND Ext:no edema NEURO:CNI Ox3. Mental status even better than yest   . carvedilol  3.125 mg Oral BID WC  . feeding supplement (ENSURE COMPLETE)  237 mL Oral BID BM  . multivitamin with minerals  1 tablet Oral Daily  . sodium bicarbonate  650 mg Oral TID  . sodium chloride  10-40 mL Intracatheter Q12H   Ct Abdomen Pelvis Wo Contrast  11/12/2013   CLINICAL DATA:  History of lymphoma, anemia  EXAM: CT ABDOMEN AND PELVIS WITHOUT CONTRAST  TECHNIQUE: Multidetector CT imaging of the abdomen and pelvis was performed following the standard protocol without IV contrast.  COMPARISON:  09/08/2013  FINDINGS: Lung bases demonstrate bilateral lower lobe consolidation with large pleural effusions. These are new from the prior exam.  The liver is homogeneous in attenuation. A single rounded hypo dense focus is noted in the left lobe similar to that seen on the prior exam consistent with a small cyst. The gallbladder is well distended and demonstrates increased density in part due to multiple small gallstones as well as vicarious excretion contrast material. The spleen and pancreas are within normal limits. The adrenal glands are stable from the prior exam. Kidneys are well visualized without evidence renal calculi or obstructive changes. Multiple cystic lesions are again seen similar to that seen on the prior exam.  Diverticulosis without evidence of diverticulitis is noted. The bladder is decompressed by Foley catheter. Some increased soft tissue density is noted in the presacral region which may be related  to free fluid. The previously seen periaortic soft tissue density near the left kidney is stable in appearance from the prior exam. Diffuse aortic calcifications are seen. The appendix is not well visualized. No inflammatory changes are noted. Generalized changes of anasarca are seen. The osseous structures show diffuse osteopenia and degenerative change. No acute bony abnormality is seen.  IMPRESSION: Mild free fluid within the pelvis. There are changes of anasarca identified in the abdominal wall.  New bilateral pleural effusions with bilateral lower lobe consolidation.  Left hepatic cysts stable from the prior exam.  Stable changes in left periaortic region when compared with the prior study.  No definitive changes to suggest recurrent lymphoma are noted.   Electronically Signed   By: Inez Catalina M.D.   On: 11/12/2013 22:13   Dg Chest Port 1 View  11/12/2013   CLINICAL DATA:  Acute mental status changes. Renal failure. Possible TTP. PICC.  EXAM: PORTABLE CHEST - 1 VIEW  COMPARISON:  DG CHEST 1V PORT dated 11/07/2013  FINDINGS: Cardiac silhouette upper normal in size to slightly enlarged but stable. Suboptimal inspiration, with worsening atelectasis in both lung bases. Pulmonary vascularity normal without evidence of pulmonary edema. Possible small bilateral pleural effusions. Right arm PICC tip projects over the lower SVC near the cavoatrial junction.  IMPRESSION: 1. Right arm PICC tip projects over the lower SVC at or near the cavoatrial junction. 2. Suboptimal inspiration with worsening atelectasis in the lower  lobes. 3. Possible small bilateral pleural effusions. No evidence of pulmonary edema.   Electronically Signed   By: Evangeline Dakin M.D.   On: 11/12/2013 11:49   BMET    Component Value Date/Time   NA 134* 11/13/2013 0500   K 4.0 11/13/2013 0500   CL 104 11/13/2013 0500   CO2 14* 11/13/2013 0500   GLUCOSE 87 11/13/2013 0500   BUN 66* 11/13/2013 0500   CREATININE 2.39* 11/13/2013 0500   CALCIUM  7.1* 11/13/2013 0500   GFRNONAA 18* 11/13/2013 0500   GFRAA 20* 11/13/2013 0500   CBC    Component Value Date/Time   WBC 10.4 11/13/2013 0500   RBC 2.76* 11/13/2013 0500   RBC 3.16* 11/12/2013 1133   HGB 8.3* 11/13/2013 0500   HCT 24.3* 11/13/2013 0500   PLT 151 11/13/2013 0500   MCV 88.0 11/13/2013 0500   MCH 30.1 11/13/2013 0500   MCHC 34.2 11/13/2013 0500   RDW 16.5* 11/13/2013 0500   LYMPHSABS 0.7 11/13/2013 0500   MONOABS 0.8 11/13/2013 0500   EOSABS 0.2 11/13/2013 0500   BASOSABS 0.0 11/13/2013 0500     Assessment: 1. ARF, non oliguric.  Serologic WU neg so far.  Labs have not yet been drawn.  UO yest was excellent 2. Hyponatremia, resolved 3. AMS, improved 4. Hx lymphoma 5. Low bicarb levels, presumably met acidosis  Plan: 1. Await Labs from today 2. Daily Scr  Windy Kalata

## 2013-11-14 NOTE — Progress Notes (Signed)
Routine EEG completed, results pending. 

## 2013-11-14 NOTE — Progress Notes (Signed)
CRITICAL VALUE ALERT  Critical value received:  Adamts 13 Activity- 29  Date of notification:  11/14/13  Time of notification:  0425pm  Critical value read back:yes  Nurse who received alert:  Audria Nine RN   MD notified (1st page):  Ghimire MD  Time of first page:  1628  MD notified (2nd page):  Time of second page:  Responding MD:  Sloan Leiter MD  Time MD responded:  [phone]

## 2013-11-14 NOTE — Progress Notes (Signed)
Physical Therapy Treatment Patient Details Name: Cindy Mccormick MRN: [id number] DOB: [birth year] Today's Date: 11/14/2013    History of Present Illness Pt is a very active 80 y/o female who was previously living independently and alone. Son called EMS as pt was getting progressively more and more confused over a few days time, with a drastic change in mental status 12 hours prior to EMS arriving. PMH significant for lymphoma (10 yrs).    PT Comments    Pt progressing with mobility/PT goals but still limited by cognition.  Pt answers orientation questions appropriately but has difficulty following cues for sequencing & technique of tasks performed.  Cont with current POC & d/c recommendation of SNF at d/c.     Follow Up Recommendations  SNF;Supervision/Assistance - 24 hour     Equipment Recommendations  3in1 (PT)    Recommendations for Other Services OT consult     Precautions / Restrictions Precautions Precautions: Fall Restrictions Weight Bearing Restrictions: No    Mobility  Bed Mobility Overal bed mobility: Needs Assistance Bed Mobility: Supine to Sit     Supine to sit: Mod assist;Min assist     General bed mobility comments: Max directional cues for sequencing & technique.  Hand-over-hand assist to reach for bed rail, initiate movement of LE's to EOB, assist to bring shoulders/trunk to sitting upright, & use of draw pad to pivot hips.  Pt had difficulty with following cues to scoot hips closer to EOB & place feet on floor.    Transfers Overall transfer level: Needs assistance Equipment used: Rolling walker (2 wheeled) Transfers: Sit to/from Stand Sit to Stand: Min assist         General transfer comment: max cueing for hand placement with pt still starting with them on RW to stand.  (A) for balance as pt leans posteriorly.    Ambulation/Gait Ambulation/Gait assistance: +2 safety/equipment;Min assist Ambulation Distance (Feet): 8 Feet Assistive device:  Rolling walker (2 wheeled) Gait Pattern/deviations: Decreased step length - right;Decreased step length - left;Shuffle     General Gait Details: (A) for balance & RW management.     Stairs            Wheelchair Mobility    Modified Rankin (Stroke Patients Only)       Balance                                    Cognition Arousal/Alertness: Awake/alert Behavior During Therapy: WFL for tasks assessed/performed Overall Cognitive Status: Impaired/Different from baseline Area of Impairment: Orientation;Safety/judgement;Following commands;Problem solving Orientation Level: Disoriented to;Situation     Following Commands: Follows one step commands with increased time;Follows multi-step commands inconsistently Safety/Judgement: Decreased awareness of safety;Decreased awareness of deficits          Exercises      General Comments        Pertinent Vitals/Pain No pain reported.     Home Living                      Prior Function            PT Goals (current goals can now be found in the care plan section) Acute Rehab PT Goals PT Goal Formulation: With patient Time For Goal Achievement: 11/23/13 Potential to Achieve Goals: Good Progress towards PT goals: Progressing toward goals    Frequency  Min 2X/week    PT Plan Current plan remains  appropriate    Co-evaluation             End of Session   Activity Tolerance: Patient limited by fatigue;Other (comment) (limited by cognition) Patient left: in chair;with call bell/phone within reach;with family/visitor present     Time: 1030-1055 PT Time Calculation (min): 25 min  Charges:  $Therapeutic Activity: 23-37 mins                    G Codes:      Sena Hitch 12/11/13, 11:02 AM   Sarajane Marek, PTA [phone] 2013-12-11

## 2013-11-14 NOTE — Progress Notes (Signed)
INITIAL NUTRITION ASSESSMENT  DOCUMENTATION CODES Per approved criteria  -Not Applicable   INTERVENTION: Add Ensure Complete po BID, each supplement provides 350 kcal and 13 grams of protein. If pt does not like Ensure Complete, may switch to [employer]. RD to continue to follow nutrition care plan.  NUTRITION DIAGNOSIS: Inadequate oral intake related to poor appetite as evidenced by poor meal completion.   Goal: Intake to meet >90% of estimated nutrition needs.  Monitor:  weight trends, lab trends, I/O's, PO intake, supplement tolerance  Reason for Assessment: MD Consult for Poor PO Intake  80 y.o. female  Admitting Dx: Altered mental status  ASSESSMENT: PMHx significant for recurrent lymphoma, LBBB, HTN, HLD, cardiomyopathy. Admitted from home alone with worsening confusion and recent UTI. Work-up reveals hyponatremia.  Developed GIB 5/6, received PRBC's, likely diverticular per GI MD.  Bleeding slowing and team recommending holding off on colonoscopy.  Pt in persistent confused state, MRI negative and resolved UTI. Oncology evaluated and ruled out TTP. EEG results pending.  RD consulted for poor PO intake. Pt advanced to soft diet on 5/9. Since that time, pt has been eating 25% or less of meals. Confirmed with son at bedside that pt's intake has been very poor since admission. At baseline, patient eats well. Her weight is currently +23 lb above her baseline 2/2 fluid status, thus it is difficult to determine if she has had true weight loss. Pt with no severe fat or muscle mass wasting on exam. Pt is at nutrition risk given advanced age and very poor oral intake since admission.  Sodium is low at 134 Potassium and magnesium WNL  Height: Ht Readings from Last 1 Encounters:  11/08/13 5\' 2"  (1.575 m)    Weight: Wt Readings from Last 1 Encounters:  11/14/13 143 lb 8.3 oz (65.1 kg)  Admission weight 120 lb/54.8 kg  Ideal Body Weight: 110 lb/50 kg  % Ideal Body  Weight: 131%  Wt Readings from Last 10 Encounters:  11/14/13 143 lb 8.3 oz (65.1 kg)  06/08/13 119 lb (53.978 kg)  03/23/13 118 lb 12.8 oz (53.887 kg)  03/01/13 115 lb (52.164 kg)  02/06/13 115 lb (52.164 kg)  01/23/13 116 lb (52.617 kg)  01/18/13 116 lb (52.617 kg)  08/12/12 122 lb (55.339 kg)  01/14/12 124 lb (56.246 kg)  07/14/11 127 lb (57.607 kg)    Usual Body Weight: 115 - 120 lb  % Usual Body Weight: 121%  BMI:  22.1 (using admit wt) - WNL  Estimated Nutritional Needs: Kcal: 1300 - 1500 Protein: 60 - 70 g Fluid: 1.3 - 1.5 liters daily  Skin: intact  Diet Order: Criss Rosales  EDUCATION NEEDS: -No education needs identified at this time   Intake/Output Summary (Last 24 hours) at 11/14/13 1005 Last data filed at 11/14/13 0605  Gross per 24 hour  Intake   1017 ml  Output   1300 ml  Net   -283 ml    Last BM: 5/11  Labs:   Recent Labs Lab 11/10/13 1700 11/11/13 0500 11/13/13 0500  NA 127* 130* 134*  K 4.3 4.3 4.0  CL 96 100 104  CO2 18* 17* 14*  BUN 62* 64* 66*  CREATININE 2.10* 2.26* 2.39*  CALCIUM 7.2* 7.1* 7.1*  MG  --   --  2.4  GLUCOSE 117* 100* 87    CBG (last 3)  No results found for this basename: GLUCAP,  in the last 72 hours  Scheduled Meds: . carvedilol  3.125 mg Oral  BID WC  . multivitamin with minerals  1 tablet Oral Daily  . sodium bicarbonate  650 mg Oral TID  . sodium chloride  10-40 mL Intracatheter Q12H    Continuous Infusions: . sodium chloride 50 mL/hr at 11/12/13 2354    Past Medical History  Diagnosis Date  . LEFT BUNDLE BRANCH BLOCK   . HYPERTENSION   . HYPERLIPIDEMIA   . CARDIOMYOPATHY   . LYMPHOMA   . DIVERTICULITIS, COLON   . Hemorrhoids, internal   . H/O: hysterectomy 1977  . Maintenance chemotherapy     Past Surgical History  Procedure Laterality Date  . Appendectomy  1949  . Toe surgery    . Lymphoma biopsies      Inda Coke MS, RD, LDN Inpatient Registered Dietitian Pager:  [phone] After-hours pager: [phone]

## 2013-11-15 DIAGNOSIS — M6281 Muscle weakness (generalized): Secondary | ICD-10-CM

## 2013-11-15 DIAGNOSIS — I959 Hypotension, unspecified: Secondary | ICD-10-CM

## 2013-11-15 LAB — COMPREHENSIVE METABOLIC PANEL
ALT: 15 U/L (ref 0–35)
AST: 21 U/L (ref 0–37)
Albumin: 1.5 g/dL — ABNORMAL LOW (ref 3.5–5.2)
Alkaline Phosphatase: 51 U/L (ref 39–117)
BUN: 61 mg/dL — AB (ref 6–23)
CALCIUM: 7.2 mg/dL — AB (ref 8.4–10.5)
CO2: 17 meq/L — AB (ref 19–32)
CREATININE: 2.45 mg/dL — AB (ref 0.50–1.10)
Chloride: 107 mEq/L (ref 96–112)
GFR, EST AFRICAN AMERICAN: 20 mL/min — AB (ref >90)
GFR, EST NON AFRICAN AMERICAN: 17 mL/min — AB (ref >90)
GLUCOSE: 91 mg/dL (ref 70–99)
Potassium: 3.8 mEq/L (ref 3.7–5.3)
Sodium: 138 mEq/L (ref 137–147)
TOTAL PROTEIN: 3.7 g/dL — AB (ref 6.0–8.3)
Total Bilirubin: 0.2 mg/dL — ABNORMAL LOW (ref 0.3–1.2)

## 2013-11-15 LAB — CBC WITH DIFFERENTIAL/PLATELET
BASOS ABS: 0 10*3/uL (ref 0.0–0.1)
BASOS PCT: 0 % (ref 0–1)
Basophils Absolute: 0 10*3/uL (ref 0.0–0.1)
Basophils Relative: 0 % (ref 0–1)
Eosinophils Absolute: 0.2 10*3/uL (ref 0.0–0.7)
Eosinophils Absolute: 0.2 10*3/uL (ref 0.0–0.7)
Eosinophils Relative: 3 % (ref 0–5)
Eosinophils Relative: 2 % (ref 0–5)
HEMATOCRIT: 20.1 % — AB (ref 36.0–46.0)
HEMATOCRIT: 32.3 % — AB (ref 36.0–46.0)
HEMOGLOBIN: 6.7 g/dL — AB (ref 12.0–15.0)
HEMOGLOBIN: 11.4 g/dL — AB (ref 12.0–15.0)
LYMPHS PCT: 9 % — AB (ref 12–46)
LYMPHS PCT: 4 % — AB (ref 12–46)
Lymphs Abs: 0.7 10*3/uL (ref 0.7–4.0)
Lymphs Abs: 0.5 10*3/uL — ABNORMAL LOW (ref 0.7–4.0)
MCH: 29.8 pg (ref 26.0–34.0)
MCH: 30.5 pg (ref 26.0–34.0)
MCHC: 33.3 g/dL (ref 30.0–36.0)
MCHC: 35.3 g/dL (ref 30.0–36.0)
MCV: 89.3 fL (ref 78.0–100.0)
MCV: 86.4 fL (ref 78.0–100.0)
MONO ABS: 0.8 10*3/uL (ref 0.1–1.0)
MONOS PCT: 7 % (ref 3–12)
MONOS PCT: 6 % (ref 3–12)
Monocytes Absolute: 0.5 10*3/uL (ref 0.1–1.0)
NEUTROS ABS: 6 10*3/uL (ref 1.7–7.7)
NEUTROS ABS: 10.6 10*3/uL — AB (ref 1.7–7.7)
NEUTROS PCT: 88 % — AB (ref 43–77)
Neutrophils Relative %: 81 % — ABNORMAL HIGH (ref 43–77)
Platelets: 252 10*3/uL (ref 150–400)
Platelets: 255 10*3/uL (ref 150–400)
RBC: 2.25 MIL/uL — ABNORMAL LOW (ref 3.87–5.11)
RBC: 3.74 MIL/uL — ABNORMAL LOW (ref 3.87–5.11)
RDW: 17.2 % — ABNORMAL HIGH (ref 11.5–15.5)
RDW: 16 % — ABNORMAL HIGH (ref 11.5–15.5)
WBC: 7.4 10*3/uL (ref 4.0–10.5)
WBC: 12.1 10*3/uL — AB (ref 4.0–10.5)

## 2013-11-15 LAB — OCCULT BLOOD X 1 CARD TO LAB, STOOL: Fecal Occult Bld: POSITIVE — AB

## 2013-11-15 LAB — LACTATE DEHYDROGENASE: LDH: 455 U/L — ABNORMAL HIGH (ref 94–250)

## 2013-11-15 LAB — PREPARE RBC (CROSSMATCH)

## 2013-11-15 LAB — RETICULOCYTES
RBC.: 3.76 MIL/uL — ABNORMAL LOW (ref 3.87–5.11)
Retic Count, Absolute: 71.4 10*3/uL (ref 19.0–186.0)
Retic Ct Pct: 1.9 % (ref 0.4–3.1)

## 2013-11-15 MED ORDER — PEG-KCL-NACL-NASULF-NA ASC-C 100 G PO SOLR
0.5000 | Freq: Once | ORAL | Status: AC
  Administered 2013-11-16: 100 g via ORAL
  Filled 2013-11-15: qty 1

## 2013-11-15 MED ORDER — BISACODYL 5 MG PO TBEC
5.0000 mg | DELAYED_RELEASE_TABLET | Freq: Once | ORAL | Status: AC
  Administered 2013-11-15: 5 mg via ORAL
  Filled 2013-11-15: qty 1

## 2013-11-15 MED ORDER — PEG-KCL-NACL-NASULF-NA ASC-C 100 G PO SOLR: 1.0000 | Freq: Once | ORAL | Status: DC

## 2013-11-15 MED ORDER — BISACODYL 5 MG PO TBEC: 5.0000 mg | DELAYED_RELEASE_TABLET | Freq: Every day | ORAL | Status: DC | PRN

## 2013-11-15 MED ORDER — PEG-KCL-NACL-NASULF-NA ASC-C 100 G PO SOLR
0.5000 | Freq: Once | ORAL | Status: AC
  Administered 2013-11-15: 100 g via ORAL
  Filled 2013-11-15: qty 1

## 2013-11-15 MED ORDER — FUROSEMIDE 10 MG/ML IJ SOLN
80.0000 mg | Freq: Once | INTRAMUSCULAR | Status: AC
  Administered 2013-11-15: 80 mg via INTRAVENOUS
  Filled 2013-11-15: qty 8

## 2013-11-15 NOTE — Progress Notes (Signed)
          Daily Rounding Note  11/15/2013, 9:45 AM  LOS: 8 days   SUBJECTIVE:      Asked to resee pt as she had recurrentl hematochezia beginning early this AM, 3 episodes so far, and drop in Hgb this AM.  No abdominal pain or nausea.  Has been eating and tolerating solids.  No chest pain, no dyspnea, not light headed.  Mental status has improved overall compared with last week.  Hgb this AM is down to 6.7 from 8.3 yesterday.  EEG suggestive of metabolic encephalopathy. Renal following for ARF.   OBJECTIVE:         Vital signs in last 24 hours:    Temp:  [97.9 F (36.6 C)-98.2 F (36.8 C)] 98.2 F (36.8 C) (05/13 0514) Pulse Rate:  [76-95] 95 (05/13 0636) Resp:  [18-20] 18 (05/13 0514) BP: (106-140)/(53-77) 135/65 mmHg (05/13 0636) SpO2:  [96 %-98 %] 96 % (05/13 0514) Weight:  [66 kg (145 lb 8.1 oz)] 66 kg (145 lb 8.1 oz) (05/13 0111) Last BM Date: 11/14/13 General: much more alert, comfrotable and appropriate   Heart: RRR Chest: clear, no labored breathing Abdomen: soft, NT, ND.  Active BS  Extremities: no CCE Neuro/Psych:  Oriented to place, year, self.  Appropriate.  No gross deficits.   Intake/Output from previous day: 05/12 0701 - 05/13 0700 In: 595.8 [I.V.:595.8] Out: 500 [Urine:500]  Intake/Output this shift:    Lab Results:  Recent Labs  11/13/13 0500 11/14/13 1135 11/15/13 0520  WBC 10.4 9.4 7.4  HGB 8.3* 8.3* 6.7*  HCT 24.3* 23.6* 20.1*  PLT 151 218 252   BMET  Recent Labs  11/13/13 0500 11/14/13 1135 11/15/13 0520  NA 134* 135* 138  K 4.0 3.8 3.8  CL 104 105 107  CO2 14* 17* 17*  GLUCOSE 87 107* 91  BUN 66* 63* 61*  CREATININE 2.39* 2.44* 2.45*  CALCIUM 7.1* 7.1* 7.2*   LFT  Recent Labs  11/13/13 0500 11/14/13 1135 11/15/13 0520  PROT 3.9* 4.2* 3.7*  ALBUMIN 1.7* 1.6* 1.5*  AST 25 22 21  ALT 14 15 15  ALKPHOS 60 58 51  BILITOT 0.5 0.4 0.2*   PT/INR  Recent Labs   11/12/13 1133  LABPROT 15.5*  INR 1.26     ASSESMENT:   *  GIB.  *  ABL anemia. Pt not particularly symptomatic.   *  Encephalopathy  *  Acute renal failure with metabolic acidosis.  non-oliguric.   *  Hx lymphoma.   *  Heart failure. EF 40 - 45%.   PLAN   *  Transfuse. *  Colonoscopy, +/- EGD tomorrow?  I put the orders in the depot.  *  Begin clears. Hopefully can consume prep without aid of NGT.  *  2 units PRBCs ordered.     Sarah J Gribbin  11/15/2013, 9:45 AM Pager: [phone]  ________________________________________________________________________  [facility] MD note:  I personally examined the patient, reviewed the data and agree with the assessment and plan described above.  Will proceed with colonoscopy +/- EGD tomorrow for continued rectal bleeding, anemia. Blood transfusions today.  On for procedures around 12:30 currently.   Daniel Jacobs, MD [facility] Pager [phone]  

## 2013-11-15 NOTE — Progress Notes (Signed)
Cindy Mccormick   DOB:[birth year]   ZW#:[id number]   UMP#:[id number]  Subjective:  Cindy Mccormick is  alert  and able to communicate well Her hemoglobin and hematocrit is dropped to 6.7/ 20.1. Stool occult was positive Patient is scheduled  for colonoscopy in AM  Objective:  Filed Vitals:   11/15/13 1642  BP: 151/75  Pulse: 72  Temp: 98.5 F (36.9 C)  Resp: 18    Body mass index is 26.61 kg/(m^2).  Intake/Output Summary (Last 24 hours) at 11/15/13 1733 Last data filed at 11/15/13 1642  Gross per 24 hour  Intake 1245.83 ml  Output    500 ml  Net 745.83 ml   GENERAL: patient is an altered mental status. moderately built and moderately nourished  SKIN: no rashes or significant lesions  HEAD: Normocephalic, atraumatic  EYES: PERRLA, EOMI, Conjunctiva are pink and non-injected, sclera clear  EARS: External ears normal  OROPHARYNX:no erythema, lips, buccal mucosa, and tongue normal and mucous membranes are moist  NECK: supple, no adenopathy, no JVD, no stridor, non-tender  LYMPH: no palpable lymphadenopathy, no hepatosplenomegaly  LUNGS: clear to auscultation , coarse sounds heard  HEART: regular rate & rhythm  ABDOMEN: soft, obese and normal bowel sounds  EXTREMITIES:no edema, no clubbing and no cyanosis  NEURO: Patient is more alert and awake today and follow simple commands   Labs:  Lab Results  Component Value Date   WBC 7.4 11/15/2013   HGB 6.7* 11/15/2013   HCT 20.1* 11/15/2013   MCV 89.3 11/15/2013   PLT 252 11/15/2013   NEUTROABS 6.0 11/15/2013     Urine Studies No results found for this basename: UACOL, UAPR, USPG, UPH, UTP, UGL, UKET, UBIL, UHGB, UNIT, UROB, ULEU, UEPI, UWBC, URBC, UBAC, CAST, CRYS, UCOM, BILUA,  in the last 72 hours  Basic Metabolic Panel:  Recent Labs Lab 11/10/13 1700 11/11/13 0500 11/13/13 0500 11/14/13 1135 11/15/13 0520  NA 127* 130* 134* 135* 138  K 4.3 4.3 4.0 3.8 3.8  CL 96 100 104 105 107  CO2 18* 17* 14* 17* 17*  GLUCOSE 117* 100* 87  107* 91  BUN 62* 64* 66* 63* 61*  CREATININE 2.10* 2.26* 2.39* 2.44* 2.45*  CALCIUM 7.2* 7.1* 7.1* 7.1* 7.2*  MG  --   --  2.4  --   --    GFR Estimated Creatinine Clearance: 15.2 ml/min (by C-G formula based on Cr of 2.45). Liver Function Tests:  Recent Labs Lab 11/11/13 0500 11/13/13 0500 11/14/13 1135 11/15/13 0520  AST 26 25 22 21   ALT 11 14 15 15   ALKPHOS 53 60 58 51  BILITOT 0.4 0.5 0.4 0.2*  PROT 3.9* 3.9* 4.2* 3.7*  ALBUMIN 1.7* 1.7* 1.6* 1.5*   No results found for this basename: LIPASE, AMYLASE,  in the last 168 hours  Recent Labs Lab 11/12/13 0930  AMMONIA 20   Coagulation profile  Recent Labs Lab 11/12/13 1133  INR 1.26    CBC:  Recent Labs Lab 11/11/13 0500 11/12/13 1900 11/13/13 0500 11/14/13 1135 11/15/13 0520  WBC 10.7* 10.1 10.4 9.4 7.4  NEUTROABS  --  8.7* 8.8* 8.3* 6.0  HGB 10.2* 9.2* 8.3* 8.3* 6.7*  HCT 28.9* 26.3* 24.3* 23.6* 20.1*  MCV 85.8 87.4 88.0 87.4 89.3  PLT 107* 130* 151 218 252   Cardiac Enzymes: No results found for this basename: CKTOTAL, CKMB, CKMBINDEX, TROPONINI,  in the last 168 hours BNP: No components found with this basename: POCBNP,  CBG: No results found for  this basename: GLUCAP,  in the last 168 hours D-Dimer No results found for this basename: DDIMER,  in the last 72 hours Hgb A1c No results found for this basename: HGBA1C,  in the last 72 hours Lipid Profile No results found for this basename: CHOL, HDL, LDLCALC, TRIG, CHOLHDL, LDLDIRECT,  in the last 72 hours Thyroid function studies No results found for this basename: TSH, T4TOTAL, FREET3, T3FREE, THYROIDAB,  in the last 72 hours Anemia work up No results found for this basename: VITAMINB12, FOLATE, FERRITIN, TIBC, IRON, RETICCTPCT,  in the last 72 hours Microbiology Recent Results (from the past 240 hour(s))  URINE CULTURE     Status: None   Collection Time    11/07/13 10:01 PM      Result Value Ref Range Status   Specimen Description URINE,  RANDOM   Final   Special Requests NONE   Final   Culture  Setup Time     Final   Value: 11/07/2013 22:29     Performed at [facility]     Final   Value: NO GROWTH     Performed at [facility]   Culture     Final   Value: NO GROWTH     Performed at [facility]   Report Status 11/08/2013 FINAL   Final  CULTURE, BLOOD (ROUTINE X 2)     Status: None   Collection Time    11/08/13 12:55 AM      Result Value Ref Range Status   Specimen Description BLOOD BLOOD RIGHT FOREARM   Final   Special Requests BOTTLES DRAWN AEROBIC ONLY 5CC   Final   Culture  Setup Time     Final   Value: 11/08/2013 08:53     Performed at [facility]   Culture     Final   Value: NO GROWTH 5 DAYS     Performed at [facility]   Report Status 11/14/2013 FINAL   Final  CULTURE, BLOOD (ROUTINE X 2)     Status: None   Collection Time    11/08/13  1:05 AM      Result Value Ref Range Status   Specimen Description BLOOD RIGHT HAND   Final   Special Requests BOTTLES DRAWN AEROBIC ONLY 5CC   Final   Culture  Setup Time     Final   Value: 11/08/2013 08:54     Performed at [facility]   Culture     Final   Value: NO GROWTH 5 DAYS     Performed at [facility]   Report Status 11/14/2013 FINAL   Final    ASSESSMENT/ PLAN:  80 years old pleasant female who is admitted with altered mental status, Hyponatremia, dehydration and UTI:  Her CT of the brain as well as MRI of the brain was negative for any CVA or carcinomatosis. During the hospital course she was found to have anemia and also thrombocytopenia worsening renal functions and the concern for TTP was entertained.  11/12/2013:  I have reviewed the peripheral blood smear and it revealed 1 schistocyte/10 pf and also increase in bands and toxic granulation suggestive of infectious process and it is not consistent with thrombotic microangiopathy  Altered mental status: Improved   CT scan of the  abdomen and pelvis performed on 11/12/2013 did not reveal any radiological evidence of lymphoma recurrence.    Anemia of multifactorial etiology( renal etiology/Blood loss/?ACD):Follow up with anemia  workup ordered. 2 units of PRBC's arranged. Colonoscopy scheduled in AM. ANA is positive. Consider obtaining ANA titers  serum  protein electrophoresis: faint band cannot be excluded in gamma region. serum immunofixationpending. Her serum kappa  light chains are elevated. Follow up with a skeletal survey ordered  EEG -abnormal. No epileptiform form activity noted  We will continue to follow up the patient with you and assist in her care.   Wilmon Arms, MD Hematology/medical oncology 11/15/2013  5:33 PM

## 2013-11-15 NOTE — Progress Notes (Signed)
Addendum  Patient seen and examined, chart and data base reviewed.  I agree with the above assessment and plan.  For full details please see Mrs. Imogene Burn PA note.  Complex and prolonged hospital stay, currently has lower GI bleed, GI to see.   Birdie Hopes, MD [facility] Pager: [phone] 11/15/2013, 2:06 PM

## 2013-11-15 NOTE — Progress Notes (Signed)
NEURO HOSPITALIST PROGRESS NOTE   SUBJECTIVE:                                                                                                                        Patient continues to improve on a daily basis. Son states she is not fully back to baseline but significantly improved.  She is to go for a Colonoscopy tomorrow due to rectal bleeding. NA 138 today, Cr 2.45, Ca+ 7.2.   OBJECTIVE:                                                                                                                           Vital signs in last 24 hours: Temp:  [97.7 F (36.5 C)-98.3 F (36.8 C)] 97.8 F (36.6 C) (05/13 1543) Pulse Rate:  [70-95] 74 (05/13 1543) Resp:  [16-20] 18 (05/13 1543) BP: (130-152)/(53-77) 152/69 mmHg (05/13 1543) SpO2:  [96 %-99 %] 98 % (05/13 1543) Weight:  [66 kg (145 lb 8.1 oz)] 66 kg (145 lb 8.1 oz) (05/13 0111)  Intake/Output from previous day: 05/12 0701 - 05/13 0700 In: 595.8 [I.V.:595.8] Out: 500 [Urine:500] Intake/Output this shift: Total I/O In: 525 [Blood:525] Out: -  Nutritional status: Clear Liquid  Past Medical History  Diagnosis Date  . LEFT BUNDLE BRANCH BLOCK   . HYPERTENSION   . HYPERLIPIDEMIA   . CARDIOMYOPATHY   . LYMPHOMA   . DIVERTICULITIS, COLON   . Hemorrhoids, internal   . H/O: hysterectomy 1977  . Maintenance chemotherapy      Neurologic Exam:  Mental Status: Alert, oriented, thought content appropriate.  Speech fluent without evidence of aphasia.  Able to follow 3 step commands without difficulty. Able tos tate she felt out of it the lest few days and it was not the pain but confusion that was a problem.  Cranial Nerves: II: Discs flat bilaterally; Visual fields grossly normal, pupils equal, round, reactive to light and accommodation III,IV, VI: ptosis not present, extra-ocular motions intact bilaterally V,VII: smile symmetric, facial light touch sensation normal bilaterally VIII: hearing  normal bilaterally IX,X: gag reflex present XI: bilateral shoulder shrug XII: midline tongue extension without atrophy or fasciculations  Motor: Right : Upper extremity   5/5    Left:  Upper extremity   5/5  Lower extremity   5/5     Lower extremity   5/5 Tone and bulk:normal tone throughout; no atrophy noted Sensory: Pinprick and light touch intact throughout, bilaterally Deep Tendon Reflexes:  1+  throughout  Plantars: Right: downgoing   Left: downgoing Cerebellar: normal finger-to-nose,  normal heel-to-shin test    Lab Results: Basic Metabolic Panel:  Recent Labs Lab 11/10/13 1700 11/11/13 0500 11/13/13 0500 11/14/13 1135 11/15/13 0520  NA 127* 130* 134* 135* 138  K 4.3 4.3 4.0 3.8 3.8  CL 96 100 104 105 107  CO2 18* 17* 14* 17* 17*  GLUCOSE 117* 100* 87 107* 91  BUN 62* 64* 66* 63* 61*  CREATININE 2.10* 2.26* 2.39* 2.44* 2.45*  CALCIUM 7.2* 7.1* 7.1* 7.1* 7.2*  MG  --   --  2.4  --   --     Liver Function Tests:  Recent Labs Lab 11/11/13 0500 11/13/13 0500 11/14/13 1135 11/15/13 0520  AST _0 ALT _1 ALKPHOS 53 60 58 51  BILITOT 0.4 0.5 0.4 0.2*  PROT 3.9* 3.9* 4.2* 3.7*  ALBUMIN 1.7* 1.7* 1.6* 1.5*   No results found for this basename: LIPASE, AMYLASE,  in the last 168 hours  Recent Labs Lab 11/12/13 0930  AMMONIA 20    CBC:  Recent Labs Lab 11/11/13 0500 11/12/13 1900 11/13/13 0500 11/14/13 1135 11/15/13 0520  WBC 10.7* 10.1 10.4 9.4 7.4  NEUTROABS  --  8.7* 8.8* 8.3* 6.0  HGB 10.2* 9.2* 8.3* 8.3* 6.7*  HCT 28.9* 26.3* 24.3* 23.6* 20.1*  MCV 85.8 87.4 88.0 87.4 89.3  PLT 107* 130* 151 218 252    Cardiac Enzymes: No results found for this basename: CKTOTAL, CKMB, CKMBINDEX, TROPONINI,  in the last 168 hours  Lipid Panel:  Recent Labs Lab 11/11/13 1004  CHOL 147  TRIG 362*  HDL 25*  CHOLHDL 5.9  VLDL 72*  LDLCALC 50    CBG: No results found for this basename: GLUCAP,  in the last 168  hours  Microbiology: Results for orders placed during the hospital encounter of 11/07/13  URINE CULTURE     Status: None   Collection Time    11/07/13 10:01 PM      Result Value Ref Range Status   Specimen Description URINE, RANDOM   Final   Special Requests NONE   Final   Culture  Setup Time     Final   Value: 11/07/2013 22:29     Performed at [facility]     Final   Value: NO GROWTH     Performed at [facility]   Culture     Final   Value: NO GROWTH     Performed at [facility]   Report Status 11/08/2013 FINAL   Final  CULTURE, BLOOD (ROUTINE X 2)     Status: None   Collection Time    11/08/13 12:55 AM      Result Value Ref Range Status   Specimen Description BLOOD BLOOD RIGHT FOREARM   Final   Special Requests BOTTLES DRAWN AEROBIC ONLY 5CC   Final   Culture  Setup Time     Final   Value: 11/08/2013 08:53     Performed at [facility]   Culture     Final   Value: NO GROWTH 5 DAYS     Performed at [facility]  Report Status 11/14/2013 FINAL   Final  CULTURE, BLOOD (ROUTINE X 2)     Status: None   Collection Time    11/08/13  1:05 AM      Result Value Ref Range Status   Specimen Description BLOOD RIGHT HAND   Final   Special Requests BOTTLES DRAWN AEROBIC ONLY 5CC   Final   Culture  Setup Time     Final   Value: 11/08/2013 08:54     Performed at [facility]   Culture     Final   Value: NO GROWTH 5 DAYS     Performed at [facility]   Report Status 11/14/2013 FINAL   Final    Coagulation Studies: No results found for this basename: LABPROT, INR,  in the last 72 hours  Imaging: No results found.  EEG IMPRESSION:  This is an abnormal EEG secondary to general background slowing. This finding may be seen with a diffuse disturbance that is etiologically nonspecific, but may include a metabolic encephalopathy, among other possibilities. No epileptiform activity was noted.     MEDICATIONS                                                                                                                         Scheduled: . carvedilol  3.125 mg Oral BID WC  . multivitamin with minerals  1 tablet Oral Daily  . peg 3350 powder  0.5 kit Oral Once   And  . [START ON 11/16/2013] peg 3350 powder  0.5 kit Oral Once  . sodium bicarbonate  650 mg Oral TID  . sodium chloride  10-40 mL Intracatheter Q12H    ASSESSMENT/PLAN:                                                                                                            Patient still not at baseline but has improved significantly. NA has returned to normal today but now suffering from drop in hemoglobin and rectal bleeding. EEG showed no epileptiform activity.   No further recommendations:  Neurology will S/O    Assessment and plan discussed with with attending physician and they are in agreement.    Etta Quill PA-C [facility] [phone]  11/15/2013, 4:22 PM   I have seen and evaluated the patient. I have reviewed the above note and made appropriate changes.  MS improving with metabolic disturbance correction. I do not think any further neurodiagnostic testing would be likely to be of value. At this time, neurology will sign off,  please call with any further questions or concerns.   Roland Rack, MD [facility] [phone]  If 7pm- 7am, please page neurology on call as listed in [place].

## 2013-11-15 NOTE — Progress Notes (Signed)
Dr. Hartford Poli notified and aware of pt's hemoglobin.

## 2013-11-15 NOTE — Clinical Social Work Note (Signed)
Clinical Social Worker continuing to follow patient and family for support and discharge planning needs.  Per chart and handoff, patient is scheduled to go to [place] at discharge, however patient son is looking into options for [facility] and [employer].  Patient is receiving blood today and has a colonoscopy planned for tomorrow.  CSW remains available for support and to facilitate patient discharge needs once medically ready.  Barbette Or, [place]

## 2013-11-15 NOTE — Progress Notes (Signed)
S:   BRB after BM this AM O:BP 135/65  Pulse 95  Temp(Src) 98.2 F (36.8 C) (Axillary)  Resp 18  Ht 5' 2"  (1.575 m)  Wt 66 kg (145 lb 8.1 oz)  BMI 26.61 kg/m2  SpO2 96%  Intake/Output Summary (Last 24 hours) at 11/15/13 0958 Last data filed at 11/15/13 0500  Gross per 24 hour  Intake 595.83 ml  Output    500 ml  Net  95.83 ml   Weight change: 0.9 kg (1 lb 15.8 oz) DUK:GURKY and alert CVS:RRR Resp:clear Abd:+ BS NTND Ext: trace edema Appears to be back to baseline mentally   . carvedilol  3.125 mg Oral BID WC  . feeding supplement (ENSURE COMPLETE)  237 mL Oral BID BM  . multivitamin with minerals  1 tablet Oral Daily  . sodium bicarbonate  650 mg Oral TID  . sodium chloride  10-40 mL Intracatheter Q12H   No results found. BMET    Component Value Date/Time   NA 138 11/15/2013 0520   K 3.8 11/15/2013 0520   CL 107 11/15/2013 0520   CO2 17* 11/15/2013 0520   GLUCOSE 91 11/15/2013 0520   BUN 61* 11/15/2013 0520   CREATININE 2.45* 11/15/2013 0520   CALCIUM 7.2* 11/15/2013 0520   GFRNONAA 17* 11/15/2013 0520   GFRAA 20* 11/15/2013 0520   CBC    Component Value Date/Time   WBC 7.4 11/15/2013 0520   RBC 2.25* 11/15/2013 0520   RBC 3.16* 11/12/2013 1133   HGB 6.7* 11/15/2013 0520   HCT 20.1* 11/15/2013 0520   PLT 252 11/15/2013 0520   MCV 89.3 11/15/2013 0520   MCH 29.8 11/15/2013 0520   MCHC 33.3 11/15/2013 0520   RDW 17.2* 11/15/2013 0520   LYMPHSABS 0.7 11/15/2013 0520   MONOABS 0.5 11/15/2013 0520   EOSABS 0.2 11/15/2013 0520   BASOSABS 0.0 11/15/2013 0520     Assessment: 1. ARF, non oliguric.  Serologic WU neg so far.  Scr stable.  She has significant proteinuria but appears to have had this for over a year (UA >376m prot in 2/14).  Not inclined to do a renal bx unless renal fx were to cont to worsen. ? How much her hx of chemo may have contributed to the proteinuria.  Discussed with her son 2. Hyponatremia, resolved 3. AMS, improved 4. Hx lymphoma 5. Low bicarb levels,  presumably met acidosis 6. GI bleed and anemia  Plan: 1.   Colonoscopy tomorrow per GI 2. Transfuse 3. Will give dose of lasix between units of blood 4. Dc foley MWindy Kalata

## 2013-11-15 NOTE — Progress Notes (Signed)
Called pt's son and provided the phone number for unit CSW on 5W. Provided CSW covering a handoff on pt's case. This CSW signing off.   Ky Barban, MSW, [facility] Clinical Social Worker [phone]

## 2013-11-15 NOTE — Progress Notes (Signed)
CRITICAL VALUE ALERT  Critical value received:  hgb 6.7  Date of notification:  11/15/13  Time of notification:  0645  Critical value read back:yes  Nurse who received alert:  Steffanie Dunn   MD notified (1st page):  MD Ghimire will be notifed  Time of first page:  Will pass on to day nurse  MD notified (2nd page):  Time of second page:  Responding MD:    Time MD responded:  MD has not responded as of yet- shift change

## 2013-11-15 NOTE — Progress Notes (Signed)
[facility] PROGRESS NOTE  Cindy Mccormick NKN:[id number] DOB: May 12, 1928 DOA: 11/07/2013 PCP: Laurel Dimmer, MD  Assessment/Plan Principal Problem:   Altered mental status Active Problems:  Diverticular/Lower GI bleed Pt had small amts of blood in her stool on 5/7 - 5/8.  Had three episodes of hematochezia overnight 5/12 - 5/13. [facility] consulted on 5/7 and felt this may be diverticular in origin, but that Cindy Mccormick altered mental stated prevented endoscopic eval. Received 2 units of packed RBC on 5/7, and is receiving 2 additional units on 5/13. [facility] reconsulted on 5/13.  Planning endoscopic eval (colon +/- egd) on 5/14. Patient hemodynamically stable.  Acute kidney injury with metabolic acidosis Uncertain etiology.  Interestingly creatinine started to trend up when DDAVP was discontinued. UA trending increased protein, ketones and large Hgb, Urine Osmolality 316, raised to 337, Urine Na 20-23, Protein-Cr ration: 6.45.  sCr trending up, now 2.45 from 1.16 on admission Lisinopril held. Renal U/S - shows indicator of medical renal disease Appreciate nephrology consultation.  Dr. Mercy Moore comments that he is not inclined to biopsy unless renal function continues to worsen.  Altered Mental Status Multifactorial:  Infection plus metabolic abnormalities (hyponatremia),  Acute renal failure, +/- underlying early dementia MRI and CT Head are negative Neurology consulted. EEG completed, result pending. Significantly improved.  Son understands patient will need 24 hour care.  He is debating whether to take her home or SNF on discharge.  Hyponatremia   Resolved. Secondary to DDAVP Na 122 at admission (range 118-134), currently 135 DDAVP for nocturnal enuresis discontinued Improved with minimal hypertonic saline and current NaCl 0.9% Follow BMP daily  UTI Initially Dx on 5/4 Outpatient, placed on Doxycycline, brought to the ED for lack of improvement in mental  status UA-not overtly suspicious for infection, UC- no growth Initially on Doxy outpatient, Rocephin and Azithromycin discontinued Resolved  Thrombocytopenia Platelets increasing.  218 on 5/12 Patient previously noted to have anemia with worsening thrombocytopenia. Hematology consulted.  Antibiotics discontinued.  -Adamts 13 assay return with decreased activity, at 29 Protein ELP.  No M spike detected. Per hematology, anemia and cytopenia not secondary to thrombotic microangiopathy  CAP - Doubt patient actually had pneumonia. Suspect more of acute lung injury/ARDS secondary to gram-negative bacteremia Initial CXR suspicious for atelectasis vs infiltrate (LLL) Asymptomatic.  No hypoxia.  Afebrile since admission WBC has normalized.  Recurrence of lymphoma? -followed at [facility] abd/pelvis with no definitive changes to suggest recurrent lymphoma  -Dr. Wilmon Arms (hematology/oncology) following. No indication of recurrence at this time. -dx'd 10 yrs prior   Chronic systolic heart failure  -EF 40-45% per ECHO 2014  -diuretics and ACE I on hold due to ARF and dehydration/hypotension  -compensated  -Admission bed weight= 54.8 kg, bed weight 5/8= 60.2 kg  Hypertension / Hypotension. Patient was hypotensive on admission  Has since resolved. Coreg restarted.  Continue to hold lisinopril due to ARF.   Hyperlipidemia Chronic,  elevated triglycerides (362), and VLDL, low HDL on panel 11/11/13.   Will ask PCP to address when acute illness has resolved.   Code Status: FULL Family Communication: Son  Disposition Plan: D/C to son's home with Full Home health services.   Consultants:  GI  Neurology  Nephrology  Hematology   Procedures:  EEG.  Results indicate possible metabolic encephalopathy.  Anti-infectives   Start     Dose/Rate Route Frequency Ordered Stop   11/12/13 1800  piperacillin-tazobactam (ZOSYN) IVPB 2.25 g  Status:  Discontinued  2.25 g 100 mL/hr over  30 Minutes Intravenous 3 times per day 11/12/13 1648 11/13/13 1940   11/12/13 1800  DAPTOmycin (CUBICIN) 350 mg in sodium chloride 0.9 % IVPB  Status:  Discontinued     350 mg 214 mL/hr over 30 Minutes Intravenous Every 24 hours 11/12/13 1709 11/13/13 1940   11/08/13 2200  cefTRIAXone (ROCEPHIN) 1 g in dextrose 5 % 50 mL IVPB  Status:  Discontinued     1 g 100 mL/hr over 30 Minutes Intravenous Every 24 hours 11/08/13 0030 11/10/13 0942   11/08/13 2200  azithromycin (ZITHROMAX) 500 mg in dextrose 5 % 250 mL IVPB  Status:  Discontinued     500 mg 250 mL/hr over 60 Minutes Intravenous Every 24 hours 11/08/13 0030 11/10/13 0942   11/07/13 2330  cefTRIAXone (ROCEPHIN) 1 g in dextrose 5 % 50 mL IVPB     1 g 100 mL/hr over 30 Minutes Intravenous  Once 11/07/13 2322 11/08/13 0006   11/07/13 2330  azithromycin (ZITHROMAX) 500 mg in dextrose 5 % 250 mL IVPB     500 mg 250 mL/hr over 60 Minutes Intravenous  Once 11/07/13 2322 11/08/13 0106      HPI 80 yo WF PMHx lymphoma, cardiomyopathy, HTN, PVD, hypotension, HLD, Hx diverticulitis. Prior to recent illness pt was highly functional lives alone has h/o recurrrent lymphoma for over 10 years initially found by her urologist around one of her ureters (several recurrences and rounds of chemo being followed by [facility]), LBBB, htn, hld, cardiomyopathy last EF around 40% Presented with several days of worsening confusion noticed by her son. About 2 weeks ago she started having lower back pain, went to see her orthopedic doctor and was put on a round of prednisone. Son says her back pain resolved and she felt wonderful on the prednisone. A couple days after the finishing the Prednisone lower back and flank pain began and she developed confusion. She was taken to her see her urologist who diagnosed her with a UTI and placed her on doxycycline. She took 2 doses of that but her symptoms worsened. She had not been eating or drinking well. The son went to check her and was  concerned over how rapidly she had declined over < 24 hrs so he called EMS.At baseline she was normally is very alert and with sharp memory.   Evaluation in the ER revealed significant hyponatremia with a Na of 119. CXR was concerning for possible LLL airspace dz. CT Head showed no acute abnormality. During admission pt had some dark stool and passing of clots and fluctuation mental status with expressive aphasia  Subjective: No complaints.  Son mentions 3 bloody bowel movements.  Objective: Filed Vitals:   11/15/13 1150  BP: 136/69  Pulse: 72  Temp: 98.3 F (36.8 C)  Resp: 18    Intake/Output Summary (Last 24 hours) at 11/15/13 1203 Last data filed at 11/15/13 1150  Gross per 24 hour  Intake 745.83 ml  Output    500 ml  Net 245.83 ml   Filed Weights   11/12/13 0500 11/14/13 0134 11/15/13 0111  Weight: 63.4 kg (139 lb 12.4 oz) 65.1 kg (143 lb 8.3 oz) 66 kg (145 lb 8.1 oz)    Exam:   General:  NAD, awake, pleasantly confused.  Conversant.  Son at bedside.  Cardiovascular: RRR, no m/r/g  Respiratory: CTA, symmetric chest wall movement  Abdomen: Soft, non-tender, +BS, No masses evident  Musculoskeletal: No swelling, cyanosis.  2+ min assist.  Neuro:  equal but weak grip and strength bilaterally in extremities  Data Reviewed: Basic Metabolic Panel:  Recent Labs Lab 11/10/13 1700 11/11/13 0500 11/13/13 0500 11/14/13 1135 11/15/13 0520  NA 127* 130* 134* 135* 138  K 4.3 4.3 4.0 3.8 3.8  CL 96 100 104 105 107  CO2 18* 17* 14* 17* 17*  GLUCOSE 117* 100* 87 107* 91  BUN 62* 64* 66* 63* 61*  CREATININE 2.10* 2.26* 2.39* 2.44* 2.45*  CALCIUM 7.2* 7.1* 7.1* 7.1* 7.2*  MG  --   --  2.4  --   --    Liver Function Tests:  Recent Labs Lab 11/11/13 0500 11/13/13 0500 11/14/13 1135 11/15/13 0520  AST 26 25 22 21   ALT 11 14 15 15   ALKPHOS 53 60 58 51  BILITOT 0.4 0.5 0.4 0.2*  PROT 3.9* 3.9* 4.2* 3.7*  ALBUMIN 1.7* 1.7* 1.6* 1.5*    Recent Labs Lab  11/12/13 0930  AMMONIA 20   CBC:  Recent Labs Lab 11/11/13 0500 11/12/13 1900 11/13/13 0500 11/14/13 1135 11/15/13 0520  WBC 10.7* 10.1 10.4 9.4 7.4  NEUTROABS  --  8.7* 8.8* 8.3* 6.0  HGB 10.2* 9.2* 8.3* 8.3* 6.7*  HCT 28.9* 26.3* 24.3* 23.6* 20.1*  MCV 85.8 87.4 88.0 87.4 89.3  PLT 107* 130* 151 218 252     Recent Results (from the past 240 hour(s))  URINE CULTURE     Status: None   Collection Time    11/07/13 10:01 PM      Result Value Ref Range Status   Specimen Description URINE, RANDOM   Final   Special Requests NONE   Final   Culture  Setup Time     Final   Value: 11/07/2013 22:29     Performed at [place]     Final   Value: NO GROWTH     Performed at [facility]   Culture     Final   Value: NO GROWTH     Performed at [facility]   Report Status 11/08/2013 FINAL   Final  CULTURE, BLOOD (ROUTINE X 2)     Status: None   Collection Time    11/08/13 12:55 AM      Result Value Ref Range Status   Specimen Description BLOOD BLOOD RIGHT FOREARM   Final   Special Requests BOTTLES DRAWN AEROBIC ONLY 5CC   Final   Culture  Setup Time     Final   Value: 11/08/2013 08:53     Performed at [facility]   Culture     Final   Value: NO GROWTH 5 DAYS     Performed at [facility]   Report Status 11/14/2013 FINAL   Final  CULTURE, BLOOD (ROUTINE X 2)     Status: None   Collection Time    11/08/13  1:05 AM      Result Value Ref Range Status   Specimen Description BLOOD RIGHT HAND   Final   Special Requests BOTTLES DRAWN AEROBIC ONLY 5CC   Final   Culture  Setup Time     Final   Value: 11/08/2013 08:54     Performed at [facility]   Culture     Final   Value: NO GROWTH 5 DAYS     Performed at [facility]   Report Status 11/14/2013 FINAL   Final     Studies: US Renal RENAL/URINARY TRACT ULTRASOUND  COMPLETE  IMPRESSION: 1. Negative for hydronephrosis. 2. Echogenic renal parenchyma, a  nonspecific indicator of medical renal disease. 3. Cholelithiasis with gallbladder wall thickening. 4. Small amount of pelvic ascites. Electronically Signed By: Arne Cleveland M.D. On: 11/11/2013 14:57   No results found.  Scheduled Meds: . bisacodyl  5 mg Oral Once  . carvedilol  3.125 mg Oral BID WC  . furosemide  80 mg Intravenous Once  . multivitamin with minerals  1 tablet Oral Daily  . peg 3350 powder  0.5 kit Oral Once   And  . [START ON 11/16/2013] peg 3350 powder  0.5 kit Oral Once  . sodium bicarbonate  650 mg Oral TID  . sodium chloride  10-40 mL Intracatheter Q12H   Continuous Infusions:    Principal Problem:   Altered mental status Active Problems:   Hyponatremia   Acute renal failure   LYMPHOMA   HYPERLIPIDEMIA   HYPERTENSION   CARDIOMYOPATHY   UNSPECIFIED PERIPHERAL VASCULAR DISEASE   UTI (lower urinary tract infection)   CAP (community acquired pneumonia)   Rectal bleeding   Acute right-sided weakness   Hypotension   Thrombocytopenia, unspecified    Time spent: 35 min.    Imogene Burn, PA-C [facility] Pager [phone]. If 7PM-7AM, please contact night-coverage at [email or web address], password [facility] 11/15/2013, 12:03 PM  LOS: 8 days

## 2013-11-16 ENCOUNTER — Encounter (HOSPITAL_COMMUNITY)
Admission: EM | Disposition: A | Discharge status: Skilled Nursing Facility | Payer: Self-pay | Source: Home / Self Care | Attending: Internal Medicine

## 2013-11-16 ENCOUNTER — Inpatient Hospital Stay (HOSPITAL_COMMUNITY): Payer: Medicare Other

## 2013-11-16 ENCOUNTER — Inpatient Hospital Stay (HOSPITAL_COMMUNITY): Payer: Medicare Other | Admitting: Certified Registered Nurse Anesthetist

## 2013-11-16 ENCOUNTER — Encounter (HOSPITAL_COMMUNITY): Payer: Self-pay | Admitting: Certified Registered Nurse Anesthetist

## 2013-11-16 ENCOUNTER — Encounter (HOSPITAL_COMMUNITY): Payer: Medicare Other | Admitting: Certified Registered Nurse Anesthetist

## 2013-11-16 DIAGNOSIS — R12 Heartburn: Secondary | ICD-10-CM

## 2013-11-16 HISTORY — PX: COLONOSCOPY WITH PROPOFOL: SHX5780

## 2013-11-16 LAB — FOLATE

## 2013-11-16 LAB — IMMUNOFIXATION ELECTROPHORESIS
IgA: 69 mg/dL (ref 69–380)
IgG (Immunoglobin G), Serum: 231 mg/dL — ABNORMAL LOW (ref 690–1700)
IgM, Serum: 105 mg/dL (ref 52–322)
TOTAL PROTEIN ELP: 3.6 g/dL — AB (ref 6.0–8.3)

## 2013-11-16 LAB — CBC WITH DIFFERENTIAL/PLATELET
BASOS ABS: 0.1 10*3/uL (ref 0.0–0.1)
BASOS PCT: 1 % (ref 0–1)
EOS ABS: 0.2 10*3/uL (ref 0.0–0.7)
Eosinophils Relative: 2 % (ref 0–5)
HCT: 32.3 % — ABNORMAL LOW (ref 36.0–46.0)
Hemoglobin: 11.1 g/dL — ABNORMAL LOW (ref 12.0–15.0)
Lymphocytes Relative: 9 % — ABNORMAL LOW (ref 12–46)
Lymphs Abs: 0.9 10*3/uL (ref 0.7–4.0)
MCH: 29.8 pg (ref 26.0–34.0)
MCHC: 34.4 g/dL (ref 30.0–36.0)
MCV: 86.8 fL (ref 78.0–100.0)
Monocytes Absolute: 0.6 10*3/uL (ref 0.1–1.0)
Monocytes Relative: 6 % (ref 3–12)
NEUTROS PCT: 82 % — AB (ref 43–77)
Neutro Abs: 8.1 10*3/uL — ABNORMAL HIGH (ref 1.7–7.7)
PLATELETS: 284 10*3/uL (ref 150–400)
RBC: 3.72 MIL/uL — AB (ref 3.87–5.11)
RDW: 16.9 % — ABNORMAL HIGH (ref 11.5–15.5)
WBC: 9.8 10*3/uL (ref 4.0–10.5)

## 2013-11-16 LAB — COMPREHENSIVE METABOLIC PANEL
ALT: 20 U/L (ref 0–35)
AST: 27 U/L (ref 0–37)
Albumin: 1.8 g/dL — ABNORMAL LOW (ref 3.5–5.2)
Alkaline Phosphatase: 62 U/L (ref 39–117)
BILIRUBIN TOTAL: 0.3 mg/dL (ref 0.3–1.2)
BUN: 54 mg/dL — ABNORMAL HIGH (ref 6–23)
CO2: 16 meq/L — AB (ref 19–32)
CREATININE: 2.36 mg/dL — AB (ref 0.50–1.10)
Calcium: 7.6 mg/dL — ABNORMAL LOW (ref 8.4–10.5)
Chloride: 110 mEq/L (ref 96–112)
GFR calc Af Amer: 21 mL/min — ABNORMAL LOW (ref >90)
GFR, EST NON AFRICAN AMERICAN: 18 mL/min — AB (ref >90)
Glucose, Bld: 90 mg/dL (ref 70–99)
Potassium: 3.7 mEq/L (ref 3.7–5.3)
Sodium: 144 mEq/L (ref 137–147)
Total Protein: 4.4 g/dL — ABNORMAL LOW (ref 6.0–8.3)

## 2013-11-16 LAB — IRON AND TIBC
IRON: 75 ug/dL (ref 42–135)
SATURATION RATIOS: 49 % (ref 20–55)
TIBC: 152 ug/dL — ABNORMAL LOW (ref 250–470)
UIBC: 77 ug/dL — ABNORMAL LOW (ref 125–400)

## 2013-11-16 LAB — FERRITIN: FERRITIN: 807 ng/mL — AB (ref 10–291)

## 2013-11-16 LAB — TYPE AND SCREEN
ABO/RH(D): O POS
Antibody Screen: NEGATIVE
UNIT DIVISION: 0
Unit division: 0

## 2013-11-16 LAB — PHOSPHORUS: PHOSPHORUS: 6.2 mg/dL — AB (ref 2.3–4.6)

## 2013-11-16 LAB — VITAMIN B12: Vitamin B-12: 712 pg/mL (ref 211–911)

## 2013-11-16 SURGERY — COLONOSCOPY WITH PROPOFOL
Anesthesia: Monitor Anesthesia Care

## 2013-11-16 MED ORDER — PROPOFOL INFUSION 10 MG/ML OPTIME
INTRAVENOUS | Status: DC | PRN
  Administered 2013-11-16: 50 ug/kg/min via INTRAVENOUS

## 2013-11-16 MED ORDER — HYDROCORTISONE 2.5 % RE CREA
TOPICAL_CREAM | Freq: Two times a day (BID) | RECTAL | Status: DC
  Administered 2013-11-16: 22:00:00 via RECTAL
  Administered 2013-11-16 – 2013-11-17 (×2): 1 via RECTAL
  Filled 2013-11-16: qty 28.35

## 2013-11-16 MED ORDER — FENTANYL CITRATE 0.05 MG/ML IJ SOLN
INTRAMUSCULAR | Status: DC | PRN
  Administered 2013-11-16 (×2): 25 ug via INTRAVENOUS

## 2013-11-16 MED ORDER — BUTAMBEN-TETRACAINE-BENZOCAINE 2-2-14 % EX AERO
INHALATION_SPRAY | CUTANEOUS | Status: DC | PRN
  Administered 2013-11-16: 2 via TOPICAL

## 2013-11-16 MED ORDER — FUROSEMIDE 40 MG PO TABS
40.0000 mg | ORAL_TABLET | Freq: Every day | ORAL | Status: DC
  Administered 2013-11-16 – 2013-11-17 (×2): 40 mg via ORAL
  Filled 2013-11-16 (×2): qty 1

## 2013-11-16 MED ORDER — SODIUM CHLORIDE 0.9 % IV SOLN: INTRAVENOUS | Status: DC

## 2013-11-16 MED ORDER — SODIUM CHLORIDE 0.9 % IV SOLN
INTRAVENOUS | Status: DC | PRN
  Administered 2013-11-16: 12:00:00 via INTRAVENOUS

## 2013-11-16 NOTE — Progress Notes (Signed)
S:   No further GI bleeding overnight per pt. O:BP 145/80  Pulse 76  Temp(Src) 97.7 F (36.5 C) (Oral)  Resp 18  Ht _0  (1.575 m)  Wt 63.4 kg (139 lb 12.4 oz)  BMI 25.56 kg/m2  SpO2 96%  Intake/Output Summary (Last 24 hours) at 11/16/13 1005 Last data filed at 11/15/13 2350  Gross per 24 hour  Intake    725 ml  Output   1850 ml  Net  -1125 ml   Weight change: -2.6 kg (-5 lb 11.7 oz) QEH:AZCUN and alert CVS:RRR Resp:clear Abd:+ BS NTND Ext: trace edema Neuro:  CNI Ox3   . carvedilol  3.125 mg Oral BID WC  . multivitamin with minerals  1 tablet Oral Daily  . sodium bicarbonate  650 mg Oral TID  . sodium chloride  10-40 mL Intracatheter Q12H   No results found. BMET    Component Value Date/Time   NA 144 11/16/2013 0600   K 3.7 11/16/2013 0600   CL 110 11/16/2013 0600   CO2 16* 11/16/2013 0600   GLUCOSE 90 11/16/2013 0600   BUN 54* 11/16/2013 0600   CREATININE 2.36* 11/16/2013 0600   CALCIUM 7.6* 11/16/2013 0600   GFRNONAA 18* 11/16/2013 0600   GFRAA 21* 11/16/2013 0600   CBC    Component Value Date/Time   WBC 9.8 11/16/2013 0600   RBC 3.72* 11/16/2013 0600   RBC 3.76* 11/15/2013 1700   HGB 11.1* 11/16/2013 0600   HCT 32.3* 11/16/2013 0600   PLT 284 11/16/2013 0600   MCV 86.8 11/16/2013 0600   MCH 29.8 11/16/2013 0600   MCHC 34.4 11/16/2013 0600   RDW 16.9* 11/16/2013 0600   LYMPHSABS 0.9 11/16/2013 0600   MONOABS 0.6 11/16/2013 0600   EOSABS 0.2 11/16/2013 0600   BASOSABS 0.1 11/16/2013 0600     Assessment: 1. ARF, non oliguric.  Serologic WU neg so far.  Scr stable.  She has significant proteinuria but appears to have had this for over a year (UA >359m prot in 2/14).  Not inclined to do a renal bx unless renal fx were to cont to worsen. ? How much her hx of chemo may have contributed to the proteinuria.  Discussed with her son 2. Hyponatremia, resolved 3. AMS, improved 4. Hx lymphoma 5. Low bicarb levels, presumably met acidosis 6. GI bleed and anemia  Plan: 1.    Colonoscopy today 2.  Start PO lasix 3. Scr in AM MWindy Kalata

## 2013-11-16 NOTE — Interval H&P Note (Signed)
History and Physical Interval Note:  11/16/2013 11:32 AM  Cindy Mccormick  has presented today for surgery, with the diagnosis of gi bleed with hematochezia and anemia  The various methods of treatment have been discussed with the patient and family. After consideration of risks, benefits and other options for treatment, the patient has consented to  Procedure(s): COLONOSCOPY WITH PROPOFOL (N/A) ESOPHAGOGASTRODUODENOSCOPY (EGD) (N/A) as a surgical intervention .  The patient's history has been reviewed, patient examined, no change in status, stable for surgery.  I have reviewed the patient's chart and labs.  Questions were answered to the patient's satisfaction.     [name]

## 2013-11-16 NOTE — Progress Notes (Signed)
Addendum  Patient seen and examined, chart and data base reviewed.  I agree with the above assessment and plan.  For full details please see Mrs. Imogene Burn PA note.  Diverticular GI bleed, acute kidney injury, UTI.   Birdie Hopes, MD [facility] Pager: [phone] 11/16/2013, 2:44 PM

## 2013-11-16 NOTE — Op Note (Signed)
[facility] [place], [id number]   COLONOSCOPY PROCEDURE REPORT  PATIENT: Cindy Mccormick, Cindy Mccormick  MR#: [id number] BIRTHDATE: 05-12-1928 , 65  yrs. old GENDER: Female ENDOSCOPIST: [name], MD PROCEDURE DATE:  11/16/2013 PROCEDURE:   Colonoscopy, diagnostic First Screening Colonoscopy - Avg.  risk and is 50 yrs.  old or older - No.  Prior Negative Screening - Now for repeat screening. N/A  History of Adenoma - Now for follow-up colonoscopy & has been > or = to 3 yrs.  N/A  Polyps Removed Today? No.  Recommend repeat exam, <10 yrs? No. ASA CLASS:   Class III INDICATIONS:hematochezia (in patient). MEDICATIONS: MAC sedation, administered by CRNA  DESCRIPTION OF PROCEDURE:   After the risks benefits and alternatives of the procedure were thoroughly explained, informed consent was obtained.  A digital rectal exam revealed internal hemorrhoids and A digital rectal exam revealed external hemorrhoids.   The [employer]ax Adult Colon [id number]  endoscope was introduced through the anus and advanced to the cecum, which was identified by both the appendix and ileocecal valve. No adverse events experienced.   The quality of the prep was good.  The instrument was then slowly withdrawn as the colon was fully examined.   COLON FINDINGS: There were numerous diverticulum throughout the colon, most densely in left side.  There were internal hemorrhoids. There were medium sized external hemorrhoids (one was slightly swollen).  The examination was otherwise normal.  Retroflexed views revealed no abnormalities. The time to cecum=5 minutes 00 seconds. Withdrawal time=10 minutes 00 seconds.  The scope was withdrawn and the procedure completed. COMPLICATIONS: There were no complications.  ENDOSCOPIC IMPRESSION: There were numerous diverticulum throughout the colon, most densely in left side.  There were internal hemorrhoids.  There were medium sized external  hemorrhoids (one was slightly swollen).  The examination was otherwise normal.  RECOMMENDATIONS: Apply twice daily steroid cream to external hemorrhoids for the next 7-10 days.   eSigned:  [name], MD 11/16/2013 12:32 PM

## 2013-11-16 NOTE — Transfer of Care (Signed)
Immediate Anesthesia Transfer of Care Note  Patient: Cindy Mccormick  Procedure(s) Performed: Procedure(s): COLONOSCOPY WITH PROPOFOL (N/A)  Patient Location: PACU and Endoscopy Unit  Anesthesia Type:MAC and General  Level of Consciousness: awake, alert  and oriented  Airway & Oxygen Therapy: Patient Spontanous Breathing and Patient connected to nasal cannula oxygen  Post-op Assessment: Report given to PACU RN and Post -op Vital signs reviewed and stable  Post vital signs: Reviewed and stable  Complications: No apparent anesthesia complications

## 2013-11-16 NOTE — Anesthesia Preprocedure Evaluation (Signed)
Anesthesia Evaluation  Patient identified by MRN, date of birth, ID band Patient awake    Reviewed: Allergy & Precautions, H&P , NPO status , Patient's Chart, lab work & pertinent test results, reviewed documented beta blocker date and time   Airway Mallampati: II TM Distance: >3 FB Neck ROM: full    Dental   Pulmonary pneumonia -, resolved, former smoker,  breath sounds clear to auscultation        Cardiovascular hypertension, On Medications and On Home Beta Blockers + Peripheral Vascular Disease + dysrhythmias Rhythm:regular     Neuro/Psych negative psych ROS   GI/Hepatic Neg liver ROS,   Endo/Other  negative endocrine ROS  Renal/GU ARF and Renal InsufficiencyRenal disease  negative genitourinary   Musculoskeletal   Abdominal   Peds  Hematology  (+) Blood dyscrasia, ,   Anesthesia Other Findings See surgeon's H&P   Reproductive/Obstetrics negative OB ROS                           Anesthesia Physical Anesthesia Plan  ASA: III  Anesthesia Plan: MAC   Post-op Pain Management:    Induction: Intravenous  Airway Management Planned: Nasal Cannula and Mask  Additional Equipment:   Intra-op Plan:   Post-operative Plan:   Informed Consent: I have reviewed the patients History and Physical, chart, labs and discussed the procedure including the risks, benefits and alternatives for the proposed anesthesia with the patient or authorized representative who has indicated his/her understanding and acceptance.   Dental Advisory Given  Plan Discussed with: CRNA and Surgeon  Anesthesia Plan Comments:         Anesthesia Quick Evaluation

## 2013-11-16 NOTE — Anesthesia Postprocedure Evaluation (Signed)
Anesthesia Post Note  Patient: Cindy Mccormick  Procedure(s) Performed: Procedure(s) (LRB): COLONOSCOPY WITH PROPOFOL (N/A)  Anesthesia type: MAC  Patient location: PACU  Post pain: Pain level controlled  Post assessment: Patient's Cardiovascular Status Stable  Last Vitals:  Filed Vitals:   11/16/13 1250  BP: 134/43  Pulse:   Temp:   Resp:     Post vital signs: Reviewed and stable  Level of consciousness: alert  Complications: No apparent anesthesia complications

## 2013-11-16 NOTE — H&P (View-Only) (Signed)
          Daily Rounding Note  11/15/2013, 9:45 AM  LOS: 8 days   SUBJECTIVE:      Asked to resee pt as she had recurrentl hematochezia beginning early this AM, 3 episodes so far, and drop in Hgb this AM.  No abdominal pain or nausea.  Has been eating and tolerating solids.  No chest pain, no dyspnea, not light headed.  Mental status has improved overall compared with last week.  Hgb this AM is down to 6.7 from 8.3 yesterday.  EEG suggestive of metabolic encephalopathy. Renal following for ARF.   OBJECTIVE:         Vital signs in last 24 hours:    Temp:  [97.9 F (36.6 C)-98.2 F (36.8 C)] 98.2 F (36.8 C) (05/13 0514) Pulse Rate:  [76-95] 95 (05/13 0636) Resp:  [18-20] 18 (05/13 0514) BP: (106-140)/(53-77) 135/65 mmHg (05/13 0636) SpO2:  [96 %-98 %] 96 % (05/13 0514) Weight:  [66 kg (145 lb 8.1 oz)] 66 kg (145 lb 8.1 oz) (05/13 0111) Last BM Date: 11/14/13 General: much more alert, comfrotable and appropriate   Heart: RRR Chest: clear, no labored breathing Abdomen: soft, NT, ND.  Active BS  Extremities: no CCE Neuro/Psych:  Oriented to place, year, self.  Appropriate.  No gross deficits.   Intake/Output from previous day: 05/12 0701 - 05/13 0700 In: 595.8 [I.V.:595.8] Out: 500 [Urine:500]  Intake/Output this shift:    Lab Results:  Recent Labs  11/13/13 0500 11/14/13 1135 11/15/13 0520  WBC 10.4 9.4 7.4  HGB 8.3* 8.3* 6.7*  HCT 24.3* 23.6* 20.1*  PLT 151 218 252   BMET  Recent Labs  11/13/13 0500 11/14/13 1135 11/15/13 0520  NA 134* 135* 138  K 4.0 3.8 3.8  CL 104 105 107  CO2 14* 17* 17*  GLUCOSE 87 107* 91  BUN 66* 63* 61*  CREATININE 2.39* 2.44* 2.45*  CALCIUM 7.1* 7.1* 7.2*   LFT  Recent Labs  11/13/13 0500 11/14/13 1135 11/15/13 0520  PROT 3.9* 4.2* 3.7*  ALBUMIN 1.7* 1.6* 1.5*  AST 25 22 21   ALT 14 15 15   ALKPHOS 60 58 51  BILITOT 0.5 0.4 0.2*   PT/INR  Recent Labs   11/12/13 1133  LABPROT 15.5*  INR 1.26     ASSESMENT:   *  GIB.  *  ABL anemia. Pt not particularly symptomatic.   *  Encephalopathy  *  Acute renal failure with metabolic acidosis.  non-oliguric.   *  Hx lymphoma.   *  Heart failure. EF 40 - 45%.   PLAN   *  Transfuse. *  Colonoscopy, +/- EGD tomorrow?  I put the orders in the depot.  *  Begin clears. Hopefully can consume prep without aid of NGT.  *  2 units PRBCs ordered.     Vena Rua  11/15/2013, 9:45 AM Pager: [phone]  ________________________________________________________________________  Vel[facility] Heckler GI MD note:  I personally examined the patient, reviewed the data and agree with the assessment and plan described above.  Will proceed with colonoscopy +/- EGD tomorrow for continued rectal bleeding, anemia. Blood transfusions today.  On for procedures around 12:30 currently.   Owens Loffler, MD [facility] Pager [phone]

## 2013-11-16 NOTE — Progress Notes (Signed)
[facility] PROGRESS NOTE  Cindy Mccormick [id number] DOB: 1927-10-22 DOA: 11/07/2013 PCP: Laurel Dimmer, MD   HPI at the time of admission:  80 yo female highly functioning lives alone has h/o recurrrent lymphoma for over 10 years initially found by her urologist around one of her ureters (several recurrences and rounds of chemo being followed by [facility]), LBBB, htn, hld, cardiomyopathy last EF around 40% comes in with several days of worsening confusion noticed by her son. About 2 weeks ago she started having lower back pain, went to see her orthopedic doctor and was put on a round of prednisone. Son says her back pain resolved and she felt wonderful on the prednisone. Finished that last week. A couple days after the finishing that she started with pain lower in her back and towards the side (he does not remember which side). She started to get confused, just mildly on Sunday night (2 night ago). He took her to see her urologist yesterday who diagnosed her with a uti and placed her on doxycycline. She took 2 doses of that, but got much worse over the night. She has not been eating or drinking hardly. He called ems today when he went to check on her, and she was much worse than when he checked on her 12 hours earlier.  She normally is very alert and with sharp memory   Assessment/Plan Principal Problem at the time of admission:   Altered mental status   Diverticular/Lower GI bleed Pt had small amts of blood in her stool on 5/7 - 5/8.  Had three episodes of hematochezia overnight 5/12 - 5/13. [facility] consulted on 5/7 and felt this may be diverticular in origin, but that Ms. Moser altered mental stated prevented endoscopic eval. Received 2 units of packed RBC on 5/7, and then received an 2 additional units on 5/13. [facility] reconsulted on 5/13.  Performed colonoscopy on 5/14.  + for diverticulosis and hemorrhoids.  Results listed below. Patient hemodynamically stable.  Acute  kidney injury with metabolic acidosis Uncertain etiology.  Interestingly creatinine started to trend up when DDAVP was discontinued. UA trending increased protein, ketones and large Hgb, Urine Osmolality 316, raised to 337, Urine Na 20-23, Protein-Cr ration: 6.45.  sCr  2.36 and beginning to trend down.  Was 1.16 on admission Lisinopril held.  Oral lasix started 5/14 by nephrology (80 IV once, then 40 daily) Renal U/S - shows indicator of medical renal disease Appreciate nephrology consultation.  Dr. Mercy Moore comments that he is not inclined to biopsy unless renal function continues to worsen.  Altered Mental Status Resolved. Multifactorial:  Infection plus metabolic abnormalities (hyponatremia),  Acute renal failure, +/- underlying early dementia MRI and CT Head are negative Neurology consulted. EEG completed, result pending. Son understands patient will need 24 hour care.    Hyponatremia   Resolved. Secondary to DDAVP Na 122 at admission (range 118-134), currently 135 DDAVP for nocturnal enuresis discontinued Improved with minimal hypertonic saline and current NaCl 0.9%  UTI Initially Dx on 5/4 Outpatient, placed on Doxycycline, brought to the ED for lack of improvement in mental status UA-not overtly suspicious for infection, UC- no growth Initially on Doxy outpatient, Rocephin and Azithromycin discontinued Resolved  Thrombocytopenia Resolved. Platelets increasing.  284 on 5/14. Patient previously noted to have anemia with worsening thrombocytopenia. Hematology consulted.  Antibiotics discontinued.  -Adamts 13 assay return with decreased activity, at 29 Protein ELP.  No M spike detected. Per hematology, anemia and cytopenia not secondary to thrombotic  microangiopathy  CAP Doubt patient actually had pneumonia. Suspect more of acute lung injury/ARDS secondary to gram-negative bacteremia Initial CXR suspicious for atelectasis vs infiltrate (LLL) Asymptomatic.  No hypoxia.   Afebrile since admission WBC has normalized.  Recurrence of lymphoma? -followed at [facility] abd/pelvis with no definitive changes to suggest recurrent lymphoma  Dr. Wilmon Arms (hematology/oncology) following. No indication of recurrence at this time. dx'd 10 yrs prior   Chronic systolic heart failure  EF 40-45% per ECHO 2014  diuretics and ACE I on hold due to ARF and dehydration/hypotension  compensated  Admission bed weight= 54.8 kg, bed weight 5/8= 60.2 kg  Hypertension / Hypotension. Patient was hypotensive on admission  Has since resolved. Coreg restarted.  Continue to hold lisinopril due to ARF.   Hyperlipidemia Chronic,  elevated triglycerides (362), and VLDL, low HDL on panel 11/11/13.   Will ask PCP to address when acute illness has resolved.   Code Status: FULL Family Communication: Son  Disposition Plan: D/C to son's home with [facility] Home health services.   Consultants:  GI  Neurology  Nephrology  Hematology   Procedures:  EEG.  Results indicate possible metabolic encephalopathy.  Anti-infectives   Start     Dose/Rate Route Frequency Ordered Stop   11/12/13 1800  piperacillin-tazobactam (ZOSYN) IVPB 2.25 g  Status:  Discontinued     2.25 g 100 mL/hr over 30 Minutes Intravenous 3 times per day 11/12/13 1648 11/13/13 1940   11/12/13 1800  DAPTOmycin (CUBICIN) 350 mg in sodium chloride 0.9 % IVPB  Status:  Discontinued     350 mg 214 mL/hr over 30 Minutes Intravenous Every 24 hours 11/12/13 1709 11/13/13 1940   11/08/13 2200  cefTRIAXone (ROCEPHIN) 1 g in dextrose 5 % 50 mL IVPB  Status:  Discontinued     1 g 100 mL/hr over 30 Minutes Intravenous Every 24 hours 11/08/13 0030 11/10/13 0942   11/08/13 2200  azithromycin (ZITHROMAX) 500 mg in dextrose 5 % 250 mL IVPB  Status:  Discontinued     500 mg 250 mL/hr over 60 Minutes Intravenous Every 24 hours 11/08/13 0030 11/10/13 0942   11/07/13 2330  cefTRIAXone (ROCEPHIN) 1 g in dextrose 5 % 50 mL IVPB      1 g 100 mL/hr over 30 Minutes Intravenous  Once 11/07/13 2322 11/08/13 0006   11/07/13 2330  azithromycin (ZITHROMAX) 500 mg in dextrose 5 % 250 mL IVPB     500 mg 250 mL/hr over 60 Minutes Intravenous  Once 11/07/13 2322 11/08/13 0106     Subjective: Patient reports she had bloody stools when she first started her colonoscopy prep, but there is no longer blood in her stool.    Objective: Filed Vitals:   11/16/13 1356  BP: 156/76  Pulse: 92  Temp: 97.4 F (36.3 C)  Resp: 16    Intake/Output Summary (Last 24 hours) at 11/16/13 1441 Last data filed at 11/15/13 2350  Gross per 24 hour  Intake  337.5 ml  Output   1850 ml  Net -1512.5 ml   Filed Weights   11/14/13 0134 11/15/13 0111 11/16/13 0520  Weight: 65.1 kg (143 lb 8.3 oz) 66 kg (145 lb 8.1 oz) 63.4 kg (139 lb 12.4 oz)    Exam:   General:  NAD, awake, no longer confused.  Conversant.  Son at bedside.  Cardiovascular: RRR, no m/r/g  Respiratory: CTA, symmetric chest wall movement  Abdomen: Soft, non-tender, +BS, No masses evident  Musculoskeletal: No swelling, cyanosis.  2+ min assist.  Neuro:  equal but weak grip and strength bilaterally in extremities  Data Reviewed: Basic Metabolic Panel:  Recent Labs Lab 11/11/13 0500 11/13/13 0500 11/14/13 1135 11/15/13 0520 11/16/13 0600  NA 130* 134* 135* 138 144  K 4.3 4.0 3.8 3.8 3.7  CL 100 104 105 107 110  CO2 17* 14* 17* 17* 16*  GLUCOSE 100* 87 107* 91 90  BUN 64* 66* 63* 61* 54*  CREATININE 2.26* 2.39* 2.44* 2.45* 2.36*  CALCIUM 7.1* 7.1* 7.1* 7.2* 7.6*  MG  --  2.4  --   --   --   PHOS  --   --   --   --  6.2*   Liver Function Tests:  Recent Labs Lab 11/11/13 0500 11/13/13 0500 11/14/13 1135 11/15/13 0520 11/16/13 0600  AST 26 25 22 21 27   ALT 11 14 15 15 20   ALKPHOS 53 60 58 51 62  BILITOT 0.4 0.5 0.4 0.2* 0.3  PROT 3.9* 3.9* 4.2* 3.7* 4.4*  ALBUMIN 1.7* 1.7* 1.6* 1.5* 1.8*    Recent Labs Lab 11/12/13 0930  AMMONIA 20    CBC:  Recent Labs Lab 11/13/13 0500 11/14/13 1135 11/15/13 0520 11/15/13 1700 11/16/13 0600  WBC 10.4 9.4 7.4 12.1* 9.8  NEUTROABS 8.8* 8.3* 6.0 10.6* 8.1*  HGB 8.3* 8.3* 6.7* 11.4* 11.1*  HCT 24.3* 23.6* 20.1* 32.3* 32.3*  MCV 88.0 87.4 89.3 86.4 86.8  PLT 151 218 252 255 284     Recent Results (from the past 240 hour(s))  URINE CULTURE     Status: None   Collection Time    11/07/13 10:01 PM      Result Value Ref Range Status   Specimen Description URINE, RANDOM   Final   Special Requests NONE   Final   Culture  Setup Time     Final   Value: 11/07/2013 22:29     Performed at [facility] Count     Final   Value: NO GROWTH     Performed at [facility]   Culture     Final   Value: NO GROWTH     Performed at [facility]   Report Status 11/08/2013 FINAL   Final  CULTURE, BLOOD (ROUTINE X 2)     Status: None   Collection Time    11/08/13 12:55 AM      Result Value Ref Range Status   Specimen Description BLOOD BLOOD RIGHT FOREARM   Final   Special Requests BOTTLES DRAWN AEROBIC ONLY 5CC   Final   Culture  Setup Time     Final   Value: 11/08/2013 08:53     Performed at [facility]   Culture     Final   Value: NO GROWTH 5 DAYS     Performed at [facility]   Report Status 11/14/2013 FINAL   Final  CULTURE, BLOOD (ROUTINE X 2)     Status: None   Collection Time    11/08/13  1:05 AM      Result Value Ref Range Status   Specimen Description BLOOD RIGHT HAND   Final   Special Requests BOTTLES DRAWN AEROBIC ONLY 5CC   Final   Culture  Setup Time     Final   Value: 11/08/2013 08:54     Performed at [facility]   Culture     Final   Value: NO GROWTH 5 DAYS  Performed at [facility]   Report Status 11/14/2013 FINAL   Final     Studies: US Renal RENAL/URINARY TRACT ULTRASOUND COMPLETE  IMPRESSION: 1. Negative for hydronephrosis. 2. Echogenic renal parenchyma, a nonspecific indicator of  medical renal disease. 3. Cholelithiasis with gallbladder wall thickening. 4. Small amount of pelvic ascites. Electronically Signed By: Arne Cleveland M.D. On: 11/11/2013 14:57   No results found.  Scheduled Meds: . carvedilol  3.125 mg Oral BID WC  . furosemide  40 mg Oral Daily  . hydrocortisone   Rectal BID  . multivitamin with minerals  1 tablet Oral Daily  . sodium bicarbonate  650 mg Oral TID  . sodium chloride  10-40 mL Intracatheter Q12H   Continuous Infusions:    Principal Problem:   Altered mental status Active Problems:   Hyponatremia   Acute renal failure   LYMPHOMA   HYPERLIPIDEMIA   HYPERTENSION   CARDIOMYOPATHY   UNSPECIFIED PERIPHERAL VASCULAR DISEASE   UTI (lower urinary tract infection)   CAP (community acquired pneumonia)   Rectal bleeding   Acute right-sided weakness   Hypotension   Thrombocytopenia, unspecified    Time spent: 35 min.    Imogene Burn, PA-C [facility] Pager [phone]. If 7PM-7AM, please contact night-coverage at [email or web address], password [facility] 11/16/2013, 2:41 PM  LOS: 9 days

## 2013-11-17 ENCOUNTER — Encounter (HOSPITAL_COMMUNITY): Payer: Self-pay | Admitting: Gastroenterology

## 2013-11-17 ENCOUNTER — Other Ambulatory Visit: Payer: Self-pay | Admitting: Hematology and Oncology

## 2013-11-17 ENCOUNTER — Telehealth: Payer: Self-pay | Admitting: Hematology and Oncology

## 2013-11-17 LAB — CBC
HCT: 27.8 % — ABNORMAL LOW (ref 36.0–46.0)
HEMOGLOBIN: 9.6 g/dL — AB (ref 12.0–15.0)
MCH: 30.5 pg (ref 26.0–34.0)
MCHC: 34.5 g/dL (ref 30.0–36.0)
MCV: 88.3 fL (ref 78.0–100.0)
Platelets: 320 10*3/uL (ref 150–400)
RBC: 3.15 MIL/uL — ABNORMAL LOW (ref 3.87–5.11)
RDW: 17 % — ABNORMAL HIGH (ref 11.5–15.5)
WBC: 8.9 10*3/uL (ref 4.0–10.5)

## 2013-11-17 LAB — COMPREHENSIVE METABOLIC PANEL
ALBUMIN: 1.7 g/dL — AB (ref 3.5–5.2)
ALT: 16 U/L (ref 0–35)
AST: 19 U/L (ref 0–37)
Alkaline Phosphatase: 54 U/L (ref 39–117)
BUN: 50 mg/dL — ABNORMAL HIGH (ref 6–23)
CALCIUM: 7.5 mg/dL — AB (ref 8.4–10.5)
CO2: 18 mEq/L — ABNORMAL LOW (ref 19–32)
Chloride: 113 mEq/L — ABNORMAL HIGH (ref 96–112)
Creatinine, Ser: 2.38 mg/dL — ABNORMAL HIGH (ref 0.50–1.10)
GFR calc Af Amer: 20 mL/min — ABNORMAL LOW (ref >90)
GFR calc non Af Amer: 18 mL/min — ABNORMAL LOW (ref >90)
Glucose, Bld: 89 mg/dL (ref 70–99)
Potassium: 3.4 mEq/L — ABNORMAL LOW (ref 3.7–5.3)
SODIUM: 147 meq/L (ref 137–147)
TOTAL PROTEIN: 4 g/dL — AB (ref 6.0–8.3)
Total Bilirubin: 0.2 mg/dL — ABNORMAL LOW (ref 0.3–1.2)

## 2013-11-17 MED ORDER — CLONAZEPAM 1 MG PO TABS: 1.0000 mg | ORAL_TABLET | Freq: Every evening | ORAL | Status: DC | PRN

## 2013-11-17 MED ORDER — ASPIRIN EC 81 MG PO TBEC: 81.0000 mg | DELAYED_RELEASE_TABLET | Freq: Every day | ORAL | Status: DC

## 2013-11-17 MED ORDER — SODIUM BICARBONATE 650 MG PO TABS: 650.0000 mg | ORAL_TABLET | Freq: Three times a day (TID) | ORAL | Status: DC

## 2013-11-17 MED ORDER — POTASSIUM CHLORIDE CRYS ER 20 MEQ PO TBCR: 20.0000 meq | EXTENDED_RELEASE_TABLET | Freq: Every day | ORAL | Status: DC

## 2013-11-17 MED ORDER — HYDROCORTISONE 2.5 % RE CREA: TOPICAL_CREAM | Freq: Two times a day (BID) | RECTAL | Status: DC

## 2013-11-17 MED ORDER — POTASSIUM CHLORIDE CRYS ER 20 MEQ PO TBCR
40.0000 meq | EXTENDED_RELEASE_TABLET | Freq: Once | ORAL | Status: AC
  Administered 2013-11-17: 40 meq via ORAL
  Filled 2013-11-17: qty 2

## 2013-11-17 MED ORDER — HYDROCORTISONE ACETATE 25 MG RE SUPP: 25.0000 mg | Freq: Two times a day (BID) | RECTAL | Status: DC

## 2013-11-17 MED ORDER — BISACODYL 5 MG PO TBEC: 5.0000 mg | DELAYED_RELEASE_TABLET | Freq: Every day | ORAL | Status: DC

## 2013-11-17 MED ORDER — FUROSEMIDE 40 MG PO TABS: 40.0000 mg | ORAL_TABLET | Freq: Every day | ORAL | Status: DC

## 2013-11-17 NOTE — Discharge Summary (Signed)
Physician Discharge Summary  Cindy Mccormick [id number] DOB: 03/25/1928 DOA: 11/07/2013  PCP: Laurel Dimmer, MD  Admit date: 11/07/2013 Discharge date: 11/17/2013  Time spent: 60 minutes  Recommendations for Outpatient Follow-up:  1. CBC, BMET on Monday 5/18.   2. Monitor for rectal bleeding.  Keep stools soft.  Restart 81 mg aspirin on 5/20 if no rectal bleeding. 3. Patient with New onset kidney failure and metabolic acidosis.  Follow up with Nephrology in 2 weeks. 4. Patient also started on lasix 40 mg daily and potassium.  Monitor bmet. 5. Discharge to [facility] for PT / OT rehab. 6. Follow up with Hematology for ongoing work up and bone scan.  Discharge Diagnoses:  Principal Problem:   Altered mental status Active Problems:   Hyponatremia   Acute renal failure   LYMPHOMA   HYPERLIPIDEMIA   HYPERTENSION   CARDIOMYOPATHY   UNSPECIFIED PERIPHERAL VASCULAR DISEASE   UTI (lower urinary tract infection)   CAP (community acquired pneumonia)   Rectal bleeding   Acute right-sided weakness   Hypotension   Thrombocytopenia, unspecified   Discharge Condition: stable.  Diet recommendation: Heart Healthy  Filed Weights   11/15/13 0111 11/16/13 0520 11/17/13 0515  Weight: 66 kg (145 lb 8.1 oz) 63.4 kg (139 lb 12.4 oz) 63.5 kg (139 lb 15.9 oz)    History of present illness at the time of admission:  80 yo female highly functioning lives alone has h/o recurrrent lymphoma for over 10 years initially found by her urologist around one of her ureters (several recurrences and rounds of chemo being followed by [facility]), LBBB, htn, hld, cardiomyopathy last EF around 40% comes in with several days of worsening confusion noticed by her son. About 2 weeks ago she started having lower back pain, went to see her orthopedic doctor and was put on a round of prednisone. Son says her back pain resolved and she felt wonderful on the prednisone. Finished that last week. A couple days  after the finishing that she started with pain lower in her back and towards the side (he does not remember which side). She started to get confused, just mildly on Sunday night (2 night ago). He took her to see her urologist yesterday who diagnosed her with a uti and placed her on doxycycline. She took 2 doses of that, but got much worse over the night. She has not been eating or drinking hardly. He called ems today when he went to check on her, and she was much worse than when he checked on her 12 hours earlier. She normally is very alert and with sharp memory   Hospital Course:   Altered Mental Status  Resolved.  Patient mildly confused on day of discharge likely 2ndary to sedation for colonoscopy on 5/14. Multifactorial: Infection plus metabolic abnormalities (hyponatremia), and acute renal failure MRI and CT Head are negative  Neurology consulted. EEG completed, results indicated likely metabolic disturbance.   Son understands patient will need 24 hour care.   Hyponatremia  Resolved. Secondary to DDAVP  Na 122 at admission (range 118-134), currently normal and stable. DDAVP for nocturnal enuresis discontinued  Improved with minimal hypertonic saline and current NaCl 0.9%   Acute kidney injury with metabolic acidosis  Uncertain etiology.  UA showed increased protein, ketones and large Hgb. Per nephrology proteinuria is not new. Creatinine is still elevated (2.38). Was 1.16 on admission  Lisinopril held. Oral lasix started 5/14 by nephrology (80 IV once, then 40 daily)  Renal U/S -  shows indicator of medical renal disease  Patient started on bicarb supplementation PO TID.  Will continue at discharge. Appreciate nephrology consultation. Dr. Mercy Moore comments that he is not inclined to biopsy unless renal function continues to worsen.  Follow up with Dr. Florene Glen outpatient in two weeks.  Diverticular/Lower GI bleed  Pt had small amts of blood in her stool on 5/7 - 5/8. Had three  episodes of hematochezia overnight 5/12 - 5/13.  Transfused 2 units of packed RBC on 5/7, and then received an 2 additional units on 5/13.  [facility] reconsulted on 5/13. Performed colonoscopy on 5/14. It showed diverticulosis and hemorrhoids. Results listed below.  Patient hemodynamically stable.  No rectal bleeding since 5/13.  UTI  Initially Dx on 5/4 Outpatient, placed on Doxycycline UA-not overtly suspicious for infection, Urine culture- no growth  Initially on Doxy outpatient, Rocephin and Azithromycin were given inpatient but quickly discontinued  Resolved   Thrombocytopenia  Resolved.  Platelets increasing. 284 on 5/14.  Patient previously noted to have anemia with worsening thrombocytopenia.  Hematology consulted. Antibiotics discontinued. -Adamts 13 assay return with decreased activity, at 29  Protein ELP. No M spike detected. Per hematology, anemia and cytopenia not secondary to thrombotic microangiopathy   CAP  Doubt patient actually had pneumonia. Suspect more of acute lung injury/ARDS secondary to gram-negative bacteremia  Initial CXR suspicious for atelectasis vs infiltrate (LLL)  Asymptomatic. No hypoxia. Afebrile since admission  WBC has normalized.   Hx of lymphoma -followed at [facility] abd/pelvis with no definitive changes to suggest recurrent lymphoma  Dr. Wilmon Arms (hematology/oncology) following. No indication of recurrence at this time.  dx'd 10 yrs prior   Chronic systolic heart failure  EF 40-45% per ECHO 2014  ACE I discontinued due to ARF and dehydration/hypotension  Currently compensated.    Admission bed weight= 54.8 kg, bed weight 5/8= 60.2 kg, bed weight on 5/15 63.5 kg  Hypertension / Hypotension.  Patient was hypotensive on admission  Has since resolved.  Coreg restarted. Continue to hold lisinopril due to ARF.   Hyperlipidemia  Chronic, elevated triglycerides (362), and VLDL, low HDL on panel 11/11/13.  Will ask PCP to address when acute  illness has resolved.   Procedures:  Colonoscopy  EEG  PICC  Consultations:  Neurology  Nephrology  Gastroenterology  Hematology  Discharge Exam: Filed Vitals:   11/17/13 1337  BP: 155/80  Pulse: 67  Temp: 98.6 F (37 C)  Resp: 18   General: NAD, awake, pleasant.  Mildly confused. Cardiovascular: RRR, no m/r/g  Respiratory: CTA, symmetric chest wall movement  Abdomen: Soft, non-tender, +BS, No masses evident  Musculoskeletal: No swelling, cyanosis. 2+ min assist.  Neuro: equal but weak grip and strength bilaterally in extremities  Discharge Instructions       Discharge Instructions   Diet - low sodium heart healthy    Complete by:  As directed      Increase activity slowly    Complete by:  As directed             Medication List    STOP taking these medications       desmopressin 0.2 MG tablet  Commonly known as:  DDAVP     doxycycline 100 MG capsule  Commonly known as:  VIBRAMYCIN     ibuprofen 200 MG tablet  Commonly known as:  ADVIL,MOTRIN     lisinopril 10 MG tablet  Commonly known as:  PRINIVIL,ZESTRIL      TAKE these medications  aspirin EC 81 MG tablet  Take 1 tablet (81 mg total) by mouth daily.  Start taking on:  11/22/2013     bisacodyl 5 MG EC tablet  Commonly known as:  DULCOLAX  Take 1 tablet (5 mg total) by mouth at bedtime.     carvedilol 3.125 MG tablet  Commonly known as:  COREG  Take 1 tablet (3.125 mg total) by mouth 2 (two) times daily with a meal.     clonazePAM 1 MG tablet  Commonly known as:  KLONOPIN  Take 1 tablet (1 mg total) by mouth at bedtime as needed (for sleep).     furosemide 40 MG tablet  Commonly known as:  LASIX  Take 1 tablet (40 mg total) by mouth daily.     hydrocortisone 2.5 % rectal cream  Commonly known as:  ANUSOL-HC  Place rectally 2 (two) times daily.     hydrocortisone 25 MG suppository  Commonly known as:  ANUSOL-HC  Place 1 suppository (25 mg total) rectally 2 (two) times  daily.     multivitamin with minerals Tabs tablet  Take 1 tablet by mouth daily.     potassium chloride SA 20 MEQ tablet  Commonly known as:  K-DUR,KLOR-CON  Take 1 tablet (20 mEq total) by mouth daily.     sodium bicarbonate 650 MG tablet  Take 1 tablet (650 mg total) by mouth 3 (three) times daily.       Allergies  Allergen Reactions  . Ezetimibe Other (See Comments)  . Hydrocodone-Acetaminophen Other (See Comments)  . Nitrofurantoin Other (See Comments)  . Other Itching and Rash    "chemo" medication given at [facility] in [place], [place]  . Oxycodone Hcl Other (See Comments)  . Sulfonamide Derivatives Other (See Comments)   Follow-up Information   Follow up with POWELL,ALVIN C, MD In 2 weeks.   Specialty:  Nephrology   Contact information:   [place] [address] [phone]       Follow up with Wilmon Arms, MD In 1 week.   Specialty:  Hematology and Oncology   Contact information:   [address] [place] [address] [phone]        The results of significant diagnostics from this hospitalization (including imaging, microbiology, ancillary and laboratory) are listed below for reference.    Significant Diagnostic Studies: Ct Abdomen Pelvis Wo Contrast  11/12/2013   CLINICAL DATA:  History of lymphoma, anemia  EXAM: CT ABDOMEN AND PELVIS WITHOUT CONTRAST  TECHNIQUE: Multidetector CT imaging of the abdomen and pelvis was performed following the standard protocol without IV contrast.  COMPARISON:  09/08/2013  FINDINGS: Lung bases demonstrate bilateral lower lobe consolidation with large pleural effusions. These are new from the prior exam.  The liver is homogeneous in attenuation. A single rounded hypo dense focus is noted in the left lobe similar to that seen on the prior exam consistent with a small cyst. The gallbladder is well distended and demonstrates increased density in part due to multiple small  gallstones as well as vicarious excretion contrast material. The spleen and pancreas are within normal limits. The adrenal glands are stable from the prior exam. Kidneys are well visualized without evidence renal calculi or obstructive changes. Multiple cystic lesions are again seen similar to that seen on the prior exam.  Diverticulosis without evidence of  diverticulitis is noted. The bladder is decompressed by Foley catheter. Some increased soft tissue density is noted in the presacral region which may be related to free fluid. The previously seen periaortic soft tissue density near the left kidney is stable in appearance from the prior exam. Diffuse aortic calcifications are seen. The appendix is not well visualized. No inflammatory changes are noted. Generalized changes of anasarca are seen. The osseous structures show diffuse osteopenia and degenerative change. No acute bony abnormality is seen.  IMPRESSION: Mild free fluid within the pelvis. There are changes of anasarca identified in the abdominal wall.  New bilateral pleural effusions with bilateral lower lobe consolidation.  Left hepatic cysts stable from the prior exam.  Stable changes in left periaortic region when compared with the prior study.  No definitive changes to suggest recurrent lymphoma are noted.   Electronically Signed   By: Inez Catalina M.D.   On: 11/12/2013 22:13   Ct Head Wo Contrast  11/07/2013   CLINICAL DATA:  Severe headache.  EXAM: CT HEAD WITHOUT CONTRAST  TECHNIQUE: Contiguous axial images were obtained from the base of the skull through the vertex without intravenous contrast.  COMPARISON:  None.  FINDINGS: Skull and Sinuses:No acute osseous findings. Clear paranasal sinuses.  Orbits: Unremarkable for age.  Brain: No evidence of acute abnormality, such as acute infarction, hemorrhage, hydrocephalus, or mass lesion/mass effect. Enlarged extra-axial spaces in the bilateral frontal and parietal region is related to atrophy given no  mass effect to suggest hygroma. Low-attenuation area along the lower left sylvian fissure is most consistent with dilated perivascular space.  IMPRESSION: No acute intracranial abnormality.   Electronically Signed   By: Jorje Guild M.D.   On: 11/07/2013 23:04   Mr Brain Wo Contrast  11/10/2013   CLINICAL DATA:  Confusion.  CVA.  EXAM: MRI HEAD WITHOUT CONTRAST  TECHNIQUE: Multiplanar, multiecho pulse sequences of the brain and surrounding structures were obtained without intravenous contrast.  COMPARISON:  CT 11/07/2013  FINDINGS: Cerebral volume normal for age.  Negative for hydrocephalus.  Negative for acute or chronic infarct. Negative for demyelinating disease. Diffusion-weighted imaging is negative.  Negative for hemorrhage or fluid collection. Negative for mass or edema.  Mild mucosal thickening in the mastoid sinus bilaterally. Paranasal sinuses are clear.  IMPRESSION: Normal MRI of the brain for age.  Mild mastoid sinus mucosal edema bilaterally.   Electronically Signed   By: Franchot Gallo M.D.   On: 11/10/2013 09:01   US Renal  11/11/2013   CLINICAL DATA:  lymphoma and AKI  EXAM: RENAL/URINARY TRACT ULTRASOUND COMPLETE  COMPARISON:  CT 01/20/2012  FINDINGS: Right Kidney:  Length: 11.2 cm. Parenchyma echogenic compared to adjacent liver. No hydronephrosis.  Left Kidney:  Length: 11.3 cm. No hydronephrosis. Several small cysts, largest 20 x 17 mm in the lower pole.  Bladder:  Decompressed by Foley catheter.  There is a small amount of pelvic ascites identified.  11 mm gallstone with gallbladder wall thickening up to 5 mm.  IMPRESSION: 1. Negative for hydronephrosis. 2. Echogenic renal parenchyma, a nonspecific indicator of medical renal disease. 3. Cholelithiasis with gallbladder wall thickening. 4. Small amount of pelvic ascites.   Electronically Signed   By: Arne Cleveland M.D.   On: 11/11/2013 14:57   Dg Chest Port 1 View  11/12/2013   CLINICAL DATA:  Acute mental status changes. Renal  failure. Possible TTP. PICC.  EXAM: PORTABLE CHEST - 1 VIEW  COMPARISON:  DG CHEST 1V PORT dated 11/07/2013  FINDINGS: Cardiac silhouette upper normal in size to slightly enlarged but stable. Suboptimal inspiration, with worsening atelectasis in both lung bases. Pulmonary vascularity normal without evidence of pulmonary edema. Possible small bilateral pleural effusions. Right arm PICC tip projects over the lower SVC near the cavoatrial junction.  IMPRESSION: 1. Right arm PICC tip projects over the lower SVC at or near the cavoatrial junction. 2. Suboptimal inspiration with worsening atelectasis in the lower lobes. 3. Possible small bilateral pleural effusions. No evidence of pulmonary edema.   Electronically Signed   By: Evangeline Dakin M.D.   On: 11/12/2013 11:49   Dg Chest Port 1 View  11/07/2013   CLINICAL DATA:  Patient is confused, diagnosed with UTI yesterday  EXAM: PORTABLE CHEST - 1 VIEW  COMPARISON:  CT CHEST-ABD-PELV W/ CM dated 09/08/2013  FINDINGS: There is left lower lobe airspace disease. There is no pleural effusion or pneumothorax. Stable cardiomediastinal silhouette. Thoracic aortic atherosclerosis. Unremarkable osseous structures.  IMPRESSION: Left lower lobe airspace disease which may reflect atelectasis versus is infiltrate.   Electronically Signed   By: Kathreen Devoid   On: 11/07/2013 22:34    Microbiology: Recent Results (from the past 240 hour(s))  URINE CULTURE     Status: None   Collection Time    11/07/13 10:01 PM      Result Value Ref Range Status   Specimen Description URINE, RANDOM   Final   Special Requests NONE   Final   Culture  Setup Time     Final   Value: 11/07/2013 22:29     Performed at [facility]     Final   Value: NO GROWTH     Performed at [facility]   Culture     Final   Value: NO GROWTH     Performed at [facility]   Report Status 11/08/2013 FINAL   Final  CULTURE, BLOOD (ROUTINE X 2)     Status: None    Collection Time    11/08/13 12:55 AM      Result Value Ref Range Status   Specimen Description BLOOD BLOOD RIGHT FOREARM   Final   Special Requests BOTTLES DRAWN AEROBIC ONLY 5CC   Final   Culture  Setup Time     Final   Value: 11/08/2013 08:53     Performed at [facility]   Culture     Final   Value: NO GROWTH 5 DAYS     Performed at [facility]   Report Status 11/14/2013 FINAL   Final  CULTURE, BLOOD (ROUTINE X 2)     Status: None   Collection Time    11/08/13  1:05 AM      Result Value Ref Range Status   Specimen Description BLOOD RIGHT HAND   Final   Special Requests BOTTLES DRAWN AEROBIC ONLY 5CC   Final   Culture  Setup Time     Final   Value: 11/08/2013 08:54     Performed at [facility]   Culture     Final   Value: NO GROWTH 5 DAYS     Performed at [facility]   Report Status 11/14/2013 FINAL   Final     Labs: Basic Metabolic Panel:  Recent Labs Lab 11/11/13 0500 11/13/13 0500 11/14/13 1135 11/15/13 0520 11/16/13 0600 11/17/13 0535  NA 130* 134* 135* 138 144 147  K 4.3 4.0 3.8 3.8 3.7 3.4*  CL 100 104  105 107 110 113*  CO2 17* 14* 17* 17* 16* 18*  GLUCOSE 100* 87 107* 91 90 89  BUN 64* 66* 63* 61* 54* 50*  CREATININE 2.26* 2.39* 2.44* 2.45* 2.36* 2.38*  CALCIUM 7.1* 7.1* 7.1* 7.2* 7.6* 7.5*  MG  --  2.4  --   --   --   --   PHOS  --   --   --   --  6.2*  --    Liver Function Tests:  Recent Labs Lab 11/13/13 0500 11/14/13 1135 11/15/13 0520 11/16/13 0600 11/17/13 0535  AST 25 22 21 27 19   ALT 14 15 15 20 16   ALKPHOS 60 58 51 62 54  BILITOT 0.5 0.4 0.2* 0.3 0.2*  PROT 3.9* 4.2* 3.7* 4.4* 4.0*  ALBUMIN 1.7* 1.6* 1.5* 1.8* 1.7*    Recent Labs Lab 11/12/13 0930  AMMONIA 20   CBC:  Recent Labs Lab 11/13/13 0500 11/14/13 1135 11/15/13 0520 11/15/13 1700 11/16/13 0600 11/17/13 0535  WBC 10.4 9.4 7.4 12.1* 9.8 8.9  NEUTROABS 8.8* 8.3* 6.0 10.6* 8.1*  --   HGB 8.3* 8.3* 6.7* 11.4* 11.1* 9.6*  HCT  24.3* 23.6* 20.1* 32.3* 32.3* 27.8*  MCV 88.0 87.4 89.3 86.4 86.8 88.3  PLT 151 218 252 255 284 320     Signed:  Karen Kitchens [phone]  [facility] 11/17/2013, 2:40 PM

## 2013-11-17 NOTE — Progress Notes (Signed)
Physical Therapy Treatment Patient Details Name: Cindy Mccormick MRN: [id number] DOB: 12-10-1927 Today's Date: 11/17/2013    History of Present Illness Pt is a very active 80 y/o female who was previously living independently and alone. Son called EMS as pt was getting progressively more and more confused over a few days time, with a drastic change in mental status 12 hours prior to EMS arriving. PMH significant for lymphoma (10 yrs).    PT Comments    Pt progressing with mobility & cognition improving as pt able to answer questions appropriately today however did appear to become somewhat confused towards end of session as pt had more difficulty following cues.    Follow Up Recommendations  SNF;Supervision/Assistance - 24 hour     Equipment Recommendations  3in1 (PT)    Recommendations for Other Services OT consult     Precautions / Restrictions Precautions Precautions: Fall Restrictions Weight Bearing Restrictions: No    Mobility  Bed Mobility Overal bed mobility: Needs Assistance Bed Mobility: Supine to Sit     Supine to sit: Supervision;HOB elevated     General bed mobility comments: Relied on rail to push herself to sitting upright but no physical (A) needed.    Transfers Overall transfer level: Needs assistance Equipment used: Rolling walker (2 wheeled) Transfers: Sit to/from Stand Sit to Stand: Min guard;Min assist         General transfer comment: cues for hand placement & technique.  Min guard to stand from bed & min (A) to achieve standing from lower surface (toilet).    Ambulation/Gait Ambulation/Gait assistance: Min assist Ambulation Distance (Feet): 20 Feet (10' x 2) Assistive device: Rolling walker (2 wheeled) Gait Pattern/deviations: Step-to pattern;Decreased step length - right;Decreased step length - left;Shuffle Gait velocity: decr   General Gait Details: Ambulated to bed>bathroom>recliner.  (A) for RW management.  Cues for safe use of  walker, body positioning inside RW, & to negotiate in/out of bathroom.     Stairs            Wheelchair Mobility    Modified Rankin (Stroke Patients Only)       Balance                                    Cognition Arousal/Alertness: Awake/alert Behavior During Therapy: WFL for tasks assessed/performed Overall Cognitive Status: Impaired/Different from baseline (improving ) Area of Impairment: Safety/judgement               General Comments: Close friend of pt present entire session.  Pt's cognition improving but confusion increasing more towards end of session however still able to answer questions appropriately.      Exercises      General Comments        Pertinent Vitals/Pain No pain reported    Home Living                      Prior Function            PT Goals (current goals can now be found in the care plan section) Acute Rehab PT Goals PT Goal Formulation: With patient Time For Goal Achievement: 11/23/13 Potential to Achieve Goals: Good Progress towards PT goals: Progressing toward goals    Frequency  Min 2X/week    PT Plan Current plan remains appropriate    Co-evaluation  End of Session Equipment Utilized During Treatment: Gait belt Activity Tolerance: Patient tolerated treatment well;Patient limited by fatigue Patient left: in chair;with call bell/phone within reach;with family/visitor present     Time: 2229-7989 PT Time Calculation (min): 26 min  Charges:  $Gait Training: 8-22 mins $Therapeutic Activity: 8-22 mins                    G Codes:      Sena Hitch 2013/12/10, 11:21 AM  Sarajane Marek, PTA [phone] 12-10-2013

## 2013-11-17 NOTE — Progress Notes (Signed)
S:   No further bleeding.  Eating OK.  CO overall feeling weak O:BP 151/74  Pulse 74  Temp(Src) 98.2 F (36.8 C) (Oral)  Resp 18  Ht 5' 2"  (1.575 m)  Wt 63.5 kg (139 lb 15.9 oz)  BMI 25.60 kg/m2  SpO2 98%  Intake/Output Summary (Last 24 hours) at 11/17/13 0915 Last data filed at 11/17/13 0610  Gross per 24 hour  Intake    240 ml  Output      0 ml  Net    240 ml   Weight change: 0.1 kg (3.5 oz) XLK:GMWNU and alert CVS:RRR Resp:clear Abd:+ BS NTND Ext: trace edema Neuro:  CNI Ox3   . carvedilol  3.125 mg Oral BID WC  . furosemide  40 mg Oral Daily  . hydrocortisone   Rectal BID  . multivitamin with minerals  1 tablet Oral Daily  . sodium bicarbonate  650 mg Oral TID  . sodium chloride  10-40 mL Intracatheter Q12H   No results found. BMET    Component Value Date/Time   NA 147 11/17/2013 0535   K 3.4* 11/17/2013 0535   CL 113* 11/17/2013 0535   CO2 18* 11/17/2013 0535   GLUCOSE 89 11/17/2013 0535   BUN 50* 11/17/2013 0535   CREATININE 2.38* 11/17/2013 0535   CALCIUM 7.5* 11/17/2013 0535   GFRNONAA 18* 11/17/2013 0535   GFRAA 20* 11/17/2013 0535   CBC    Component Value Date/Time   WBC 8.9 11/17/2013 0535   RBC 3.15* 11/17/2013 0535   RBC 3.76* 11/15/2013 1700   HGB 9.6* 11/17/2013 0535   HCT 27.8* 11/17/2013 0535   PLT 320 11/17/2013 0535   MCV 88.3 11/17/2013 0535   MCH 30.5 11/17/2013 0535   MCHC 34.5 11/17/2013 0535   RDW 17.0* 11/17/2013 0535   LYMPHSABS 0.9 11/16/2013 0600   MONOABS 0.6 11/16/2013 0600   EOSABS 0.2 11/16/2013 0600   BASOSABS 0.1 11/16/2013 0600     Assessment: 1. ARF, non oliguric.  Serologic WU neg so far.  Scr stable.  She has significant proteinuria but appears to have had this for over a year (UA >324m prot in 2/14).  Not inclined to do a renal bx unless renal fx were to cont to worsen. ? How much her hx of chemo may have contributed to the proteinuria.  Discussed with her son 2. Hyponatremia, resolved 3. AMS, improved 4. Hx lymphoma 5. Low  bicarb levels, presumably met acidosis 6. GI bleed prob sec hemorrhoid vs tics  Plan: 1. Recheck Scr in am and CBC       MWindy Kalata

## 2013-11-17 NOTE — Telephone Encounter (Signed)
FAXED RELEASE OF MEDICAL RECORDS TO [facility] TO OBTAIN OFFICE NOTES.

## 2013-11-17 NOTE — Progress Notes (Signed)
CSW Armed forces technical officer) spoke with pt son and informed of pt dc today. Pt son confirmed plan is for dc to [facility]. Pt son informed CSW he will be able to go to [facility] between 3-4pm. CSW to coordinate non-emergent transport around this time.  CSW left voicemail with facility to confirm they can accept pt today.  [place], B[place]so[place]

## 2013-11-17 NOTE — Progress Notes (Signed)
CSW (Clinical Education officer, museum) prepared pt dc packet and placed with shadow chart. CSW arranged non-emergent ambulance transport at 3:15pm. Pt, pt family, pt nurse, and facility informed. CSW signing off.   [place], [place]

## 2013-11-17 NOTE — Progress Notes (Signed)
Nsg Discharge Note  Admit Date:  11/07/2013 Discharge date: 11/17/2013   Cindy Mccormick to be D/C'd Skilled nursing facility per MD order.  AVS completed.  Copy for chart, and copy for patient signed, and dated. Patient/caregiver able to verbalize understanding.  Discharge Medication:   Medication List    STOP taking these medications       desmopressin 0.2 MG tablet  Commonly known as:  DDAVP     doxycycline 100 MG capsule  Commonly known as:  VIBRAMYCIN     ibuprofen 200 MG tablet  Commonly known as:  ADVIL,MOTRIN     lisinopril 10 MG tablet  Commonly known as:  PRINIVIL,ZESTRIL      TAKE these medications       aspirin EC 81 MG tablet  Take 1 tablet (81 mg total) by mouth daily.  Start taking on:  11/22/2013     bisacodyl 5 MG EC tablet  Commonly known as:  DULCOLAX  Take 1 tablet (5 mg total) by mouth at bedtime.     carvedilol 3.125 MG tablet  Commonly known as:  COREG  Take 1 tablet (3.125 mg total) by mouth 2 (two) times daily with a meal.     clonazePAM 1 MG tablet  Commonly known as:  KLONOPIN  Take 1 tablet (1 mg total) by mouth at bedtime as needed (for sleep).     furosemide 40 MG tablet  Commonly known as:  LASIX  Take 1 tablet (40 mg total) by mouth daily.     hydrocortisone 2.5 % rectal cream  Commonly known as:  ANUSOL-HC  Place rectally 2 (two) times daily.     hydrocortisone 25 MG suppository  Commonly known as:  ANUSOL-HC  Place 1 suppository (25 mg total) rectally 2 (two) times daily.     multivitamin with minerals Tabs tablet  Take 1 tablet by mouth daily.     potassium chloride SA 20 MEQ tablet  Commonly known as:  K-DUR,KLOR-CON  Take 1 tablet (20 mEq total) by mouth daily.     sodium bicarbonate 650 MG tablet  Take 1 tablet (650 mg total) by mouth 3 (three) times daily.        Discharge Assessment: Filed Vitals:   11/17/13 1337  BP: 155/80  Pulse: 67  Temp: 98.6 F (37 C)  Resp: 18   Skin clean, dry and intact without  evidence of skin break down, no evidence of skin tears noted. MAD in mid, upper, and lower sacral region. Barrier cream applied. IV catheter discontinued intact. Site without signs and symptoms of complications - no redness or edema noted at insertion site, patient denies c/o pain - only slight tenderness at site.  Dressing with slight pressure applied.  D/c Instructions-Education: Discharge instructions given to patient/family with verbalized understanding. D/c education completed with patient/family including follow up instructions, medication list, d/c activities limitations if indicated, with other d/c instructions as indicated by MD - patient able to verbalize understanding, all questions fully answered. Patient instructed to return to ED, call 911, or call MD for any changes in condition.  Patient escorted via WC, and D/C to SNF via ambulance.  Cindy Hefty Lesly Pontarelli, RN 11/17/2013 4:24 PM

## 2013-11-17 NOTE — Progress Notes (Signed)
just slightly vi bruising of a 9 cm were felt in the hospital patient'syour oncology completion discharged. Cindy Mccormick   DOB:Nov 02, 1927   QI#:[id number]   XLK#:[id number]  Subjective:  Cindy Mccormick is  alert  and able to communicate well Her hemoglobin and hematocrit is stable after the blood transfusion.    Patient had colonoscopy this AM No new episodes of bleeding   Objective:  There were no vitals filed for this visit.  There is no weight on file to calculate BMI.  Intake/Output Summary (Last 24 hours) at 11/15/13 1733 Last data filed at 11/15/13 1642  Gross per 24 hour  Intake 1245.83 ml  Output    500 ml  Net 745.83 ml   GENERAL: patient is an altered mental status. moderately built and moderately nourished  SKIN: no rashes or significant lesions  HEAD: Normocephalic, atraumatic  EYES: PERRLA, EOMI, Conjunctiva are pink and non-injected, sclera clear  EARS: External ears normal  OROPHARYNX:no erythema, lips, buccal mucosa, and tongue normal and mucous membranes are moist  NECK: supple, no adenopathy, no JVD, no stridor, non-tender  LYMPH: no palpable lymphadenopathy, no hepatosplenomegaly  LUNGS: clear to auscultation , coarse sounds heard  HEART: regular rate & rhythm  ABDOMEN: soft, obese and normal bowel sounds  EXTREMITIES:no edema, no clubbing and no cyanosis  NEURO: Patient is more alert and awake today and follow simple commands   Labs:  Lab Results  Component Value Date   WBC 8.9 11/17/2013   HGB 9.6* 11/17/2013   HCT 27.8* 11/17/2013   MCV 88.3 11/17/2013   PLT 320 11/17/2013   NEUTROABS 8.1* 11/16/2013     Urine Studies No results found for this basename: UACOL, UAPR, USPG, UPH, UTP, UGL, UKET, UBIL, UHGB, UNIT, UROB, ULEU, UEPI, UWBC, URBC, UBAC, CAST, CRYS, UCOM, BILUA,  in the last 72 hours  Basic Metabolic Panel:  Recent Labs Lab 11/11/13 0500 11/13/13 0500 11/14/13 1135 11/15/13 0520 11/16/13 0600 11/17/13 0535  NA 130* 134* 135* 138 144 147   K 4.3 4.0 3.8 3.8 3.7 3.4*  CL 100 104 105 107 110 113*  CO2 17* 14* 17* 17* 16* 18*  GLUCOSE 100* 87 107* 91 90 89  BUN 64* 66* 63* 61* 54* 50*  CREATININE 2.26* 2.39* 2.44* 2.45* 2.36* 2.38*  CALCIUM 7.1* 7.1* 7.1* 7.2* 7.6* 7.5*  MG  --  2.4  --   --   --   --   PHOS  --   --   --   --  6.2*  --    GFR Estimated Creatinine Clearance: 15.4 ml/min (by C-G formula based on Cr of 2.38). Liver Function Tests:  Recent Labs Lab 11/13/13 0500 11/14/13 1135 11/15/13 0520 11/16/13 0600 11/17/13 0535  AST 25 22 21 27 19   ALT 14 15 15 20 16   ALKPHOS 60 58 51 62 54  BILITOT 0.5 0.4 0.2* 0.3 0.2*  PROT 3.9* 4.2* 3.7* 4.4* 4.0*  ALBUMIN 1.7* 1.6* 1.5* 1.8* 1.7*   No results found for this basename: LIPASE, AMYLASE,  in the last 168 hours  Recent Labs Lab 11/12/13 0930  AMMONIA 20   Coagulation profile  Recent Labs Lab 11/12/13 1133  INR 1.26    CBC:  Recent Labs Lab 11/13/13 0500 11/14/13 1135 11/15/13 0520 11/15/13 1700 11/16/13 0600 11/17/13 0535  WBC 10.4 9.4 7.4 12.1* 9.8 8.9  NEUTROABS 8.8* 8.3* 6.0 10.6* 8.1*  --   HGB 8.3* 8.3* 6.7* 11.4* 11.1* 9.6*  HCT 24.3*  23.6* 20.1* 32.3* 32.3* 27.8*  MCV 88.0 87.4 89.3 86.4 86.8 88.3  PLT 151 218 252 255 284 320   Cardiac Enzymes: No results found for this basename: CKTOTAL, CKMB, CKMBINDEX, TROPONINI,  in the last 168 hours BNP: No components found with this basename: POCBNP,  CBG: No results found for this basename: GLUCAP,  in the last 168 hours D-Dimer No results found for this basename: DDIMER,  in the last 72 hours Hgb A1c No results found for this basename: HGBA1C,  in the last 72 hours Lipid Profile No results found for this basename: CHOL, HDL, LDLCALC, TRIG, CHOLHDL, LDLDIRECT,  in the last 72 hours Thyroid function studies No results found for this basename: TSH, T4TOTAL, FREET3, T3FREE, THYROIDAB,  in the last 72 hours Anemia work up  Recent Labs  11/15/13 1700  VITAMINB12 712  FOLATE  >20.0  FERRITIN 807*  TIBC 152*  IRON 75  RETICCTPCT 1.9   Microbiology Recent Results (from the past 240 hour(s))  URINE CULTURE     Status: None   Collection Time    11/07/13 10:01 PM      Result Value Ref Range Status   Specimen Description URINE, RANDOM   Final   Special Requests NONE   Final   Culture  Setup Time     Final   Value: 11/07/2013 22:29     Performed at [place]     Final   Value: NO GROWTH     Performed at [facility]   Culture     Final   Value: NO GROWTH     Performed at [facility]   Report Status 11/08/2013 FINAL   Final  CULTURE, BLOOD (ROUTINE X 2)     Status: None   Collection Time    11/08/13 12:55 AM      Result Value Ref Range Status   Specimen Description BLOOD BLOOD RIGHT FOREARM   Final   Special Requests BOTTLES DRAWN AEROBIC ONLY 5CC   Final   Culture  Setup Time     Final   Value: 11/08/2013 08:53     Performed at [facility]   Culture     Final   Value: NO GROWTH 5 DAYS     Performed at [facility]   Report Status 11/14/2013 FINAL   Final  CULTURE, BLOOD (ROUTINE X 2)     Status: None   Collection Time    11/08/13  1:05 AM      Result Value Ref Range Status   Specimen Description BLOOD RIGHT HAND   Final   Special Requests BOTTLES DRAWN AEROBIC ONLY 5CC   Final   Culture  Setup Time     Final   Value: 11/08/2013 08:54     Performed at [facility]   Culture     Final   Value: NO GROWTH 5 DAYS     Performed at [facility]   Report Status 11/14/2013 FINAL   Final    ASSESSMENT/ PLAN:  80 years old pleasant female who is admitted with altered mental status, Hyponatremia, dehydration and UTI:  Her CT of the brain as well as MRI of the brain was negative for any CVA or carcinomatosis. During the hospital course she was found to have anemia and also thrombocytopenia worsening renal functions and the concern for TTP was entertained.  11/12/2013:  I have  reviewed the peripheral blood smear and it  revealed 1 schistocyte/10 pf and also increase in bands and toxic granulation suggestive of infectious process and it is not consistent with thrombotic microangiopathy  Altered mental status: Resolved  CT scan of the abdomen and pelvis performed on 11/12/2013 did not reveal any radiological evidence of lymphoma recurrence.    Anemia of multifactorial etiology( renal etiology/Blood loss/?ACD/ ANA is positive):Follow up with anemia workup ordered. 2 units of PRBC's were transfused yesterday. Colonoscopy revealed diverticulosis and internal hemorrhoids     11-14-2013: serum  protein electrophoresis: faint band cannot be excluded in gamma region.  serum immunofixation results revealed monoclonal IgM kappa protein. Kappa free light chains are also elevated.  skeletal survey not done yet. Result has been faxed to patient's primary oncologist Dr. Ella Jubilee and left a telephone message to patients son  EEG -abnormal. No epileptiform form activity noted  The patient to follow up with primary oncologist in 2 weeks on discharge for further evaluation and management of monoclonal gammopathy  Thank you for allowing [place] to participate in this very pleasant patient care. please call us with any questions or concerns  Wilmon Arms, MD Hematology/medical oncology 11/17/2013

## 2013-11-17 NOTE — Discharge Summary (Signed)
Addendum  Patient seen and examined, chart and data base reviewed.  I agree with the above assessment and plan.  For full details please see Mrs. Imogene Burn PA note.  Altered mental status, thought to be secondary to UTI.  Acute renal failure with metabolic acidosis, seen by nephrology.  Anemia, likely secondary to diverticular bleed, status post colonoscopy showed numerous diverticula throughout the colon, internal/external hemorrhoids.  Patient to followup with oncology and nephrology as outpatient.   Birdie Hopes, MD [facility] Pager: [phone] 11/17/2013, 2:43 PM

## 2013-11-20 ENCOUNTER — Telehealth: Payer: Self-pay | Admitting: Hematology and Oncology

## 2013-11-20 NOTE — Telephone Encounter (Signed)
S/W PATIENT SON AND GAVE NP APPT FOR 05/26 @ 11:30 W/DR. Fife.

## 2013-11-20 NOTE — Telephone Encounter (Addendum)
Received call from pt's son, Kris Mouton stating that Dr. Earnest Conroy had called him about some further work-up.  Reviewed notes & pt had elevated labs showing monoclonal gammopathy & results were sent to Dr. Ella Jubilee.  Discussed monoclonal gammopathy briefly with son & reason for further work-up.  Kris Mouton reports that Dr. Ella Jubilee was at [facility] in [place] but moved earlier in the month to [place].  He has been her oncologist for 18 years or so but [place] a long trip for the pt.  Not sure where these results were sent but her records are probably at [facility].  Kris Mouton thinks it would be better to f/u locally.  [facility] has set up appt for 11/28/13 with Dr Alvy Bimler. Kris Mouton will notify Jonelle Sidle of phone number to get records at [facility].

## 2013-11-22 ENCOUNTER — Telehealth: Payer: Self-pay | Admitting: Hematology and Oncology

## 2013-11-22 NOTE — Telephone Encounter (Signed)
C/D 11/22/13 for appt. 11/28/13

## 2013-11-28 ENCOUNTER — Encounter: Payer: Self-pay | Admitting: Hematology and Oncology

## 2013-11-28 ENCOUNTER — Ambulatory Visit (HOSPITAL_BASED_OUTPATIENT_CLINIC_OR_DEPARTMENT_OTHER): Payer: Medicare Other | Admitting: Hematology and Oncology

## 2013-11-28 ENCOUNTER — Ambulatory Visit (HOSPITAL_BASED_OUTPATIENT_CLINIC_OR_DEPARTMENT_OTHER): Payer: Medicare Other

## 2013-11-28 VITALS — BP 158/92 | HR 88 | Temp 98.4°F | Resp 18 | Ht 62.0 in | Wt 133.9 lb

## 2013-11-28 DIAGNOSIS — N179 Acute kidney failure, unspecified: Secondary | ICD-10-CM

## 2013-11-28 DIAGNOSIS — D649 Anemia, unspecified: Secondary | ICD-10-CM

## 2013-11-28 DIAGNOSIS — R942 Abnormal results of pulmonary function studies: Secondary | ICD-10-CM

## 2013-11-28 DIAGNOSIS — C8589 Other specified types of non-Hodgkin lymphoma, extranodal and solid organ sites: Secondary | ICD-10-CM

## 2013-11-28 DIAGNOSIS — R609 Edema, unspecified: Secondary | ICD-10-CM

## 2013-11-28 DIAGNOSIS — R0602 Shortness of breath: Secondary | ICD-10-CM

## 2013-11-28 DIAGNOSIS — D638 Anemia in other chronic diseases classified elsewhere: Secondary | ICD-10-CM

## 2013-11-28 DIAGNOSIS — Z87898 Personal history of other specified conditions: Secondary | ICD-10-CM

## 2013-11-28 NOTE — Progress Notes (Signed)
Checked in new patient with no financial issues at this time and has not seen the dr yet. She has not been out of the country

## 2013-11-28 NOTE — Progress Notes (Signed)
[facility] FOLLOW-UP progress notes  Patient Care Team: Laurel Dimmer, MD as PCP - General (Family Medicine)  CHIEF COMPLAINTS/PURPOSE OF VISIT:  History of recurrent lymphoma, no evidence of disease  HISTORY OF PRESENTING ILLNESS:  Cindy Mccormick 80 y.o. female was transferred to my care after her prior physician has left.  She is here today accompanied by her son I reviewed the patient's records extensive and collaborated the history with the patient. Summary of her history is as follows: Reviewed her outside records extensively related to her prior history of lymphoma. Summary of her oncologic history is as follows: Oncology History   LYMPHOMA, lymphoplasmacytic    Primary site: Lymphoid Neoplasms   Staging method: AJCC 6th Edition   Clinical: Stage III signed by Heath Lark, MD on 11/28/2013  9:06 PM   Summary: Stage III A Initial IPI score of 2 Diagnosed elsewhere, CD 20 positive     LYMPHOMA   05/31/1995 Imaging Date approximate. Ct imaging for hematuria showed large retroperitoneal mass with left ureter involvment   06/01/1995 Surgery She had laparotomy and bilateral salpingo-oophorectomy and biopsy to be proven to be lymphoma.   06/07/1995 - 11/04/1995 Chemotherapy She completed 6 cycles of CHOP plus etoposide chemotherapy   01/04/2004 Imaging Imaging showed recurrence, confirmed on PET/CT scan.   01/07/2004 Procedure Repeat biopsy confirmed CD20 positive lymphoma   01/14/2004 - 03/08/2004 Chemotherapy She received single agent rituximab with no benefit.   03/27/2004 - 09/16/2004 Chemotherapy She received 6 cycles of chemotherapy with RCVP with stable disease   01/20/2012 Imaging Repeat CT scan showed new onset of left hydronephrosis and recurrence of soft tissue mass   02/08/2012 Imaging That CT scan confirmed abnormalities   02/17/2012 Procedure Repeat biopsy confirmed recurrent lymphoplasmacytic lymphoma   03/04/2012 - 07/14/2012 Chemotherapy She received 6 more cycles  of RCVP treatment   07/31/2012 Imaging Repeat PET scan showed improved disease control   11/03/2012 Imaging CT scan showed stable soft tissue mass with no recurrence   09/08/2013 Imaging Repeat CT scan showed no evidence of recurrence   11/07/2013 - 11/17/2013 Hospital Admission She was admitted to the hospital with anemia and thrombocytopenia related to UTI, resolved. She also has acute on chronic renal failure   11/12/2013 Imaging CT scan show no evidence of disease recurrence   She was last seen by her oncologist in February of 2015. Recently, she was admitted to the hospital with altered mental status, anemia, pancytopenia and acute renal failure thought to be related to urinary tract infection. Her symptoms subsequently improved and she was discharged to a skilled nursing facility. Today, prior to the transportation to the clinic, she developed increased breathing. Apparently, she has blood work done as well as chest x-ray. Her symptoms improved after she was given furosemide injection.   The patient denies any recent signs or symptoms of bleeding such as spontaneous epistaxis, hematuria or hematochezia. Her symptoms of fatigue and weakness has improved.  She denies any recent fever, chills, night sweats or abnormal weight loss Denies new lymphadenopathy.  MEDICAL HISTORY:  Past Medical History  Diagnosis Date  . LEFT BUNDLE BRANCH BLOCK   . HYPERTENSION   . HYPERLIPIDEMIA   . CARDIOMYOPATHY   . LYMPHOMA   . DIVERTICULITIS, COLON   . Hemorrhoids, internal   . H/O: hysterectomy 1977  . Maintenance chemotherapy   . Skin cancer     melanoma diagnosed elsewhere 1999  . Cancer     recurrent lymphoma  . Cancer  carcinoid cancer per outside record    SURGICAL HISTORY: Past Surgical History  Procedure Laterality Date  . Appendectomy  1949  . Toe surgery    . Lymphoma biopsies    . Colonoscopy with propofol N/A 11/16/2013    Procedure: COLONOSCOPY WITH PROPOFOL;  Surgeon: [name], MD;  Location: [facility];  Service: Endoscopy;  Laterality: N/A;    SOCIAL HISTORY: History   Social History  . Marital Status: Widowed    Spouse Name: N/A    Number of Children: N/A  . Years of Education: N/A   Occupational History  . retired Other   Social History Main Topics  . Smoking status: Former Research scientist (life sciences)  . Smokeless tobacco: Never Used  . Alcohol Use: No  . Drug Use: No  . Sexual Activity: Not on file   Other Topics Concern  . Not on file   Social History Narrative  . No narrative on file    FAMILY HISTORY: Family History  Problem Relation Age of Onset  . Lymphoma Mother   . Sudden death Father   . Hypertension Neg Hx   . Hyperlipidemia Neg Hx   . Heart attack Neg Hx   . Diabetes Neg Hx     ALLERGIES:  is allergic to ezetimibe; hydrocodone-acetaminophen; nitrofurantoin; other; oxycodone hcl; and sulfonamide derivatives.  MEDICATIONS:  Current Outpatient Prescriptions  Medication Sig Dispense Refill  . Amino Acids-Protein Hydrolys (FEEDING SUPPLEMENT, PRO-STAT SUGAR FREE 64,) LIQD Take 30 mLs by mouth 2 (two) times daily with a meal.      . aspirin EC 81 MG tablet Take 1 tablet (81 mg total) by mouth daily.  90 tablet  3  . bisacodyl (DULCOLAX) 5 MG EC tablet Take 1 tablet (5 mg total) by mouth at bedtime.  30 tablet  0  . carvedilol (COREG) 3.125 MG tablet Take 1 tablet (3.125 mg total) by mouth 2 (two) times daily with a meal.  180 tablet  4  . furosemide (LASIX) 80 MG tablet Take 80 mg by mouth as needed.      Marland Kitchen levothyroxine (SYNTHROID, LEVOTHROID) 25 MCG tablet Take 25 mcg by mouth daily before breakfast.      . Multiple Vitamin (MULTIVITAMIN WITH MINERALS) TABS tablet Take 1 tablet by mouth daily.      . potassium chloride SA (K-DUR,KLOR-CON) 20 MEQ tablet Take 1 tablet (20 mEq total) by mouth daily.      . sennosides-docusate sodium (SENOKOT-S) 8.6-50 MG tablet Take 1 tablet by mouth 2 (two) times daily.      . sodium bicarbonate 650 MG  tablet Take 1 tablet (650 mg total) by mouth 3 (three) times daily.       No current facility-administered medications for this visit.    REVIEW OF SYSTEMS:   Constitutional: Denies fevers, chills or abnormal night sweats Eyes: Denies blurriness of vision, double vision or watery eyes Ears, nose, mouth, throat, and face: Denies mucositis or sore throat Gastrointestinal:  Denies nausea, heartburn or change in bowel habits Skin: Denies abnormal skin rashes Lymphatics: Denies new lymphadenopathy or easy bruising Neurological:Denies numbness, tingling or new weaknesses Behavioral/Psych: Mood is stable, no new changes  All other systems were reviewed with the patient and are negative.  PHYSICAL EXAMINATION: ECOG PERFORMANCE STATUS: 2 - Symptomatic, <50% confined to bed  Filed Vitals:   11/28/13 1143  BP: 158/92  Pulse: 88  Temp: 98.4 F (36.9 C)  Resp: 18   Filed Weights   11/28/13 1143  Weight: 133 lb 14.4 oz (60.737 kg)    GENERAL:alert, no distress and comfortable SKIN: skin color, texture, turgor are normal, no rashes or significant lesions EYES: normal, conjunctiva are pink and non-injected, sclera clear OROPHARYNX:no exudate, normal lips, buccal mucosa, and tongue  NECK: supple, thyroid normal size, non-tender, without nodularity LYMPH:  no palpable lymphadenopathy in the cervical, axillary or inguinal LUNGS:  she has bilateral crackles at the bases with increased work of breathing. No wheezes. HEART: regular rate & rhythm with soft systolic murmur on the left sternal border. She has moderate bilateral  lower extremity edema ABDOMEN:abdomen soft, non-tender and normal bowel sounds. No splenomegaly  Musculoskeletal:no cyanosis of digits and no clubbing  PSYCH: alert & oriented x 3 with fluent speech NEURO: no focal motor/sensory deficits  LABORATORY DATA:  I have reviewed the data as listed Lab Results  Component Value Date   WBC 8.9 11/17/2013   HGB 9.6* 11/17/2013    HCT 27.8* 11/17/2013   MCV 88.3 11/17/2013   PLT 320 11/17/2013    Recent Labs  11/15/13 0520 11/16/13 0600 11/17/13 0535  NA 138 144 147  K 3.8 3.7 3.4*  CL 107 110 113*  CO2 17* 16* 18*  GLUCOSE 91 90 89  BUN 61* 54* 50*  CREATININE 2.45* 2.36* 2.38*  CALCIUM 7.2* 7.6* 7.5*  GFRNONAA 17* 18* 18*  GFRAA 20* 21* 20*  PROT 3.7* 4.4* 4.0*  ALBUMIN 1.5* 1.8* 1.7*  AST 21 27 19   ALT 15 20 16   ALKPHOS 51 62 54  BILITOT 0.2* 0.3 0.2*   I have also reviewed some of her outside records which showed improvement of her serum creatinine to 1.9 and hemoglobin is well above 11 g.  RADIOGRAPHIC STUDIES: I have personally reviewed the radiological images as listed and agreed with the findings in the report. Ct Abdomen Pelvis Wo Contrast  11/12/2013   CLINICAL DATA:  History of lymphoma, anemia  EXAM: CT ABDOMEN AND PELVIS WITHOUT CONTRAST  TECHNIQUE: Multidetector CT imaging of the abdomen and pelvis was performed following the standard protocol without IV contrast.  COMPARISON:  09/08/2013  FINDINGS: Lung bases demonstrate bilateral lower lobe consolidation with large pleural effusions. These are new from the prior exam.  The liver is homogeneous in attenuation. A single rounded hypo dense focus is noted in the left lobe similar to that seen on the prior exam consistent with a small cyst. The gallbladder is well distended and demonstrates increased density in part due to multiple small gallstones as well as vicarious excretion contrast material. The spleen and pancreas are within normal limits. The adrenal glands are stable from the prior exam. Kidneys are well visualized without evidence renal calculi or obstructive changes. Multiple cystic lesions are again seen similar to that seen on the prior exam.  Diverticulosis without evidence of diverticulitis is noted. The bladder is decompressed by Foley catheter. Some increased soft tissue density is noted in the presacral region which may be related to  free fluid. The previously seen periaortic soft tissue density near the left kidney is stable in appearance from the prior exam. Diffuse aortic calcifications are seen. The appendix is not well visualized. No inflammatory changes are noted. Generalized changes of anasarca are seen. The osseous structures show diffuse osteopenia and degenerative change. No acute bony abnormality is seen.  IMPRESSION: Mild free fluid within the pelvis. There are changes of anasarca identified in the abdominal wall.  New bilateral pleural effusions with bilateral lower lobe consolidation.  Left  hepatic cysts stable from the prior exam.  Stable changes in left periaortic region when compared with the prior study.  No definitive changes to suggest recurrent lymphoma are noted.   Electronically Signed   By: Inez Catalina M.D.   On: 11/12/2013 22:13   ASSESSMENT & PLAN:  #1 history of recurrent lymphoma No evidence of recurrence #2 anemia This is likely anemia of chronic disease. The patient denies recent history of bleeding such as epistaxis, hematuria or hematochezia. She is asymptomatic from the anemia. We will observe for now.  She does not require transfusion now.  #3 acute renal failure This is improving. Continue diuretic therapy #4 recent thrombocytopenia, resolved #5 shortness of breath #6 abnormal chest exam #7 bilateral lower extremity edema Clinically, she does not seem to have recurrence of infection. I suspect she has mild fluid overload. Her symptoms is improved with diuretic therapy. I would defer to her primary care provider for management #8 questionable history of melanoma and carcinoid This is found on her outside records. I will clarify with her oncolologist. Clinically she has no evidence of recurrence of cancer.  I recommend followup in 3 months with a repeat history, physical examination and blood work.  Orders Placed This Encounter  Procedures  . CBC with Differential    Standing Status: Future      Number of Occurrences:      Standing Expiration Date: 11/28/2014  . Comprehensive metabolic panel    Standing Status: Future     Number of Occurrences:      Standing Expiration Date: 11/28/2014  . Lactate dehydrogenase    Standing Status: Future     Number of Occurrences:      Standing Expiration Date: 11/28/2014  . Morphology    Standing Status: Future     Number of Occurrences:      Standing Expiration Date: 11/28/2014    All questions were answered. The patient knows to call the clinic with any problems, questions or concerns. I spent 40 minutes counseling the patient face to face. The total time spent in the appointment was 60 minutes and more than 50% was on counseling.     [facility] Lark, MD 11/28/2013 9:28 PM

## 2013-11-29 ENCOUNTER — Telehealth: Payer: Self-pay | Admitting: Hematology and Oncology

## 2013-11-29 NOTE — Telephone Encounter (Signed)
s.w. pt son and advised on Aug appt....Marland Kitchenok and aware...he gets her to her appts

## 2013-11-30 ENCOUNTER — Telehealth: Payer: Self-pay | Admitting: Hematology and Oncology

## 2013-11-30 NOTE — Telephone Encounter (Signed)
I spoke with her oncologist regarding her diagnosis. 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

## 2014-02-15 IMAGING — CR DG ANKLE COMPLETE 3+V*R*
3 series · 3 of 3 positions shown · non-contrast
Comparison: 01/18/2013

CLINICAL DATA: Follow up distal fibular fracture

RIGHT ANKLE - COMPLETE 3+ VIEW

[t ankle joint ap right]
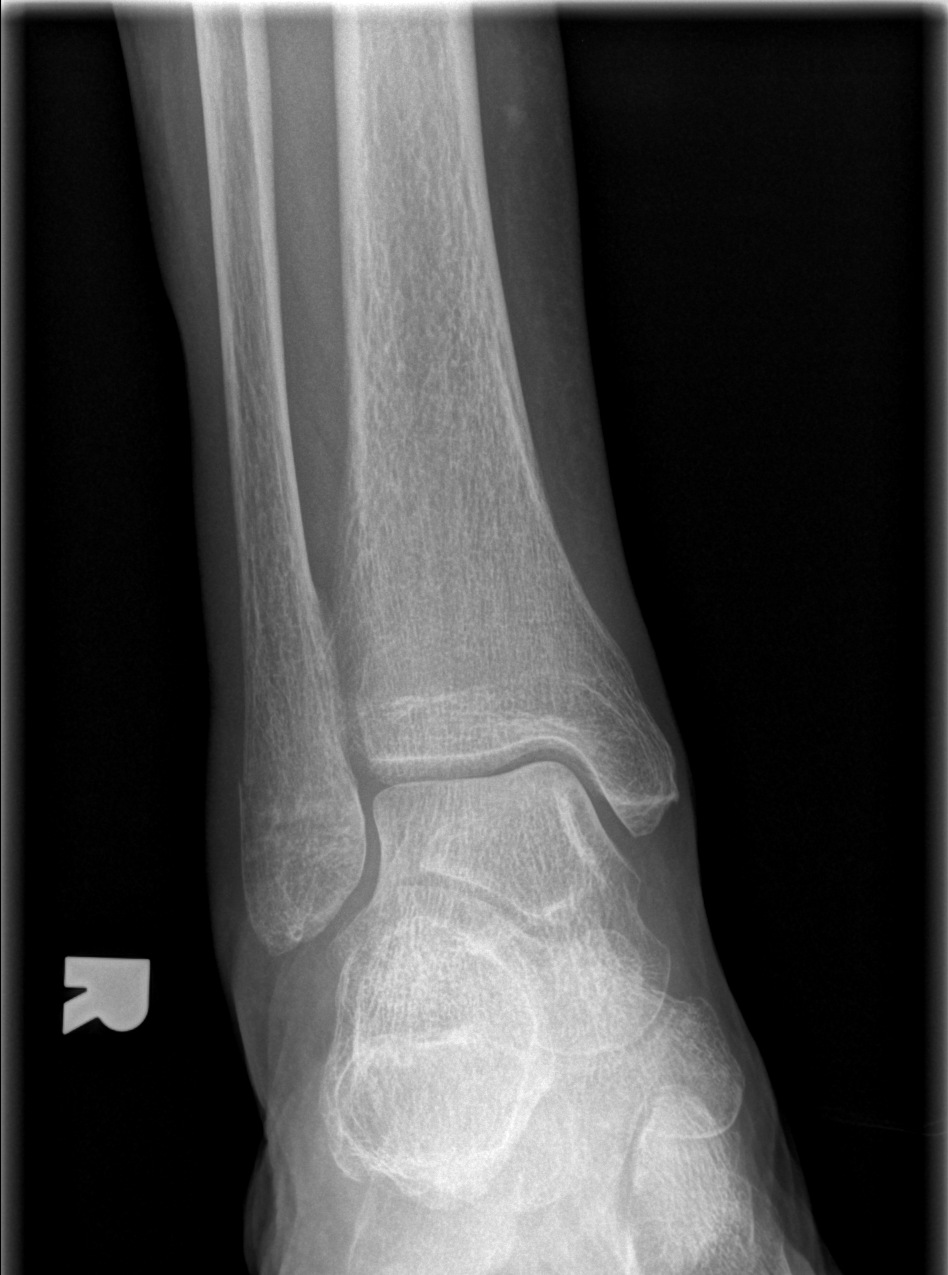

[t ankle joint oblique right]
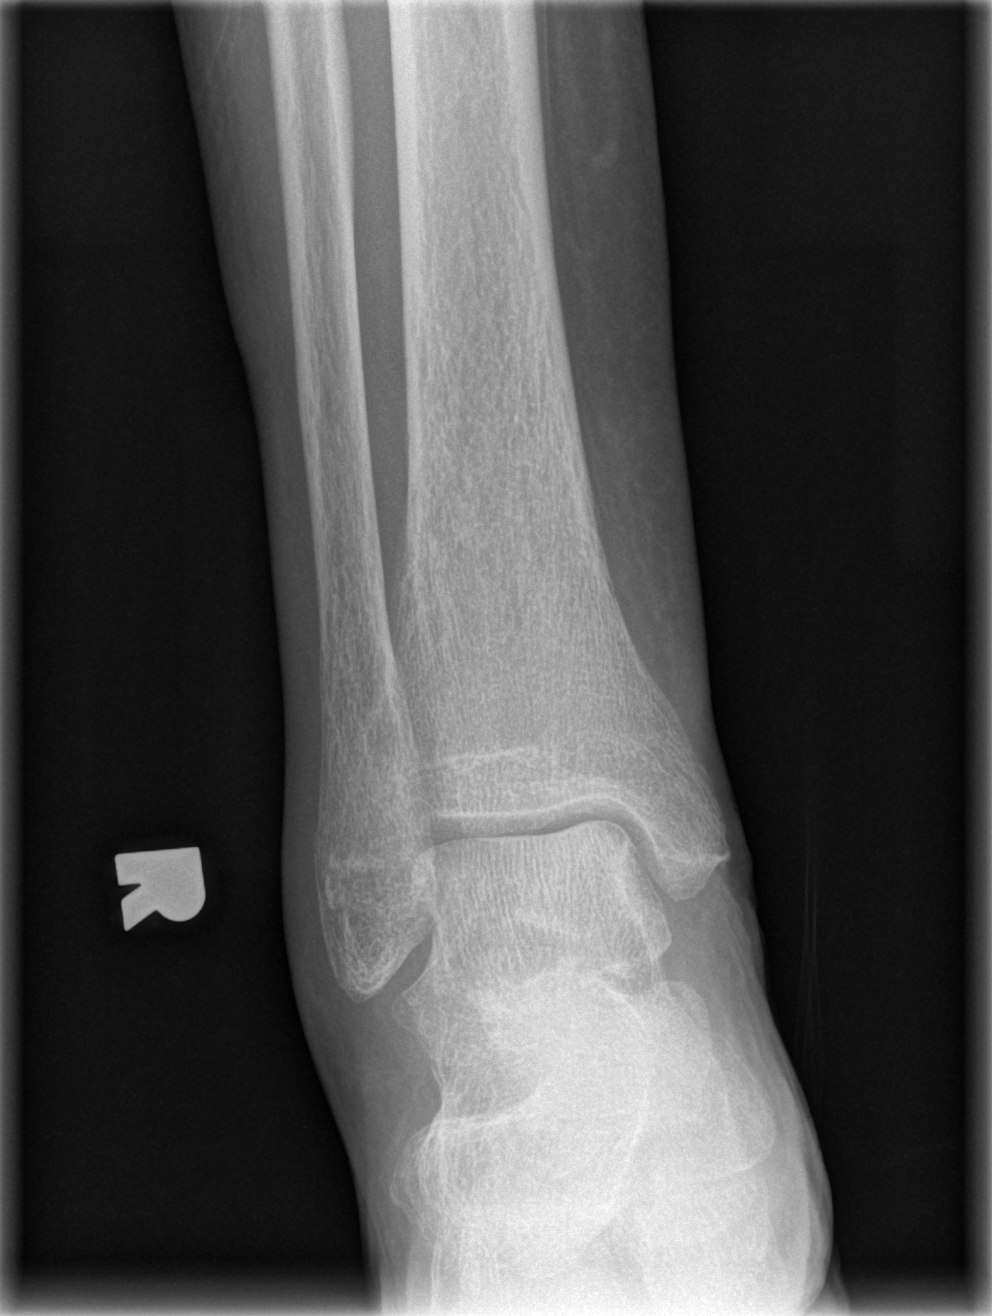

[t ankle joint lat right]
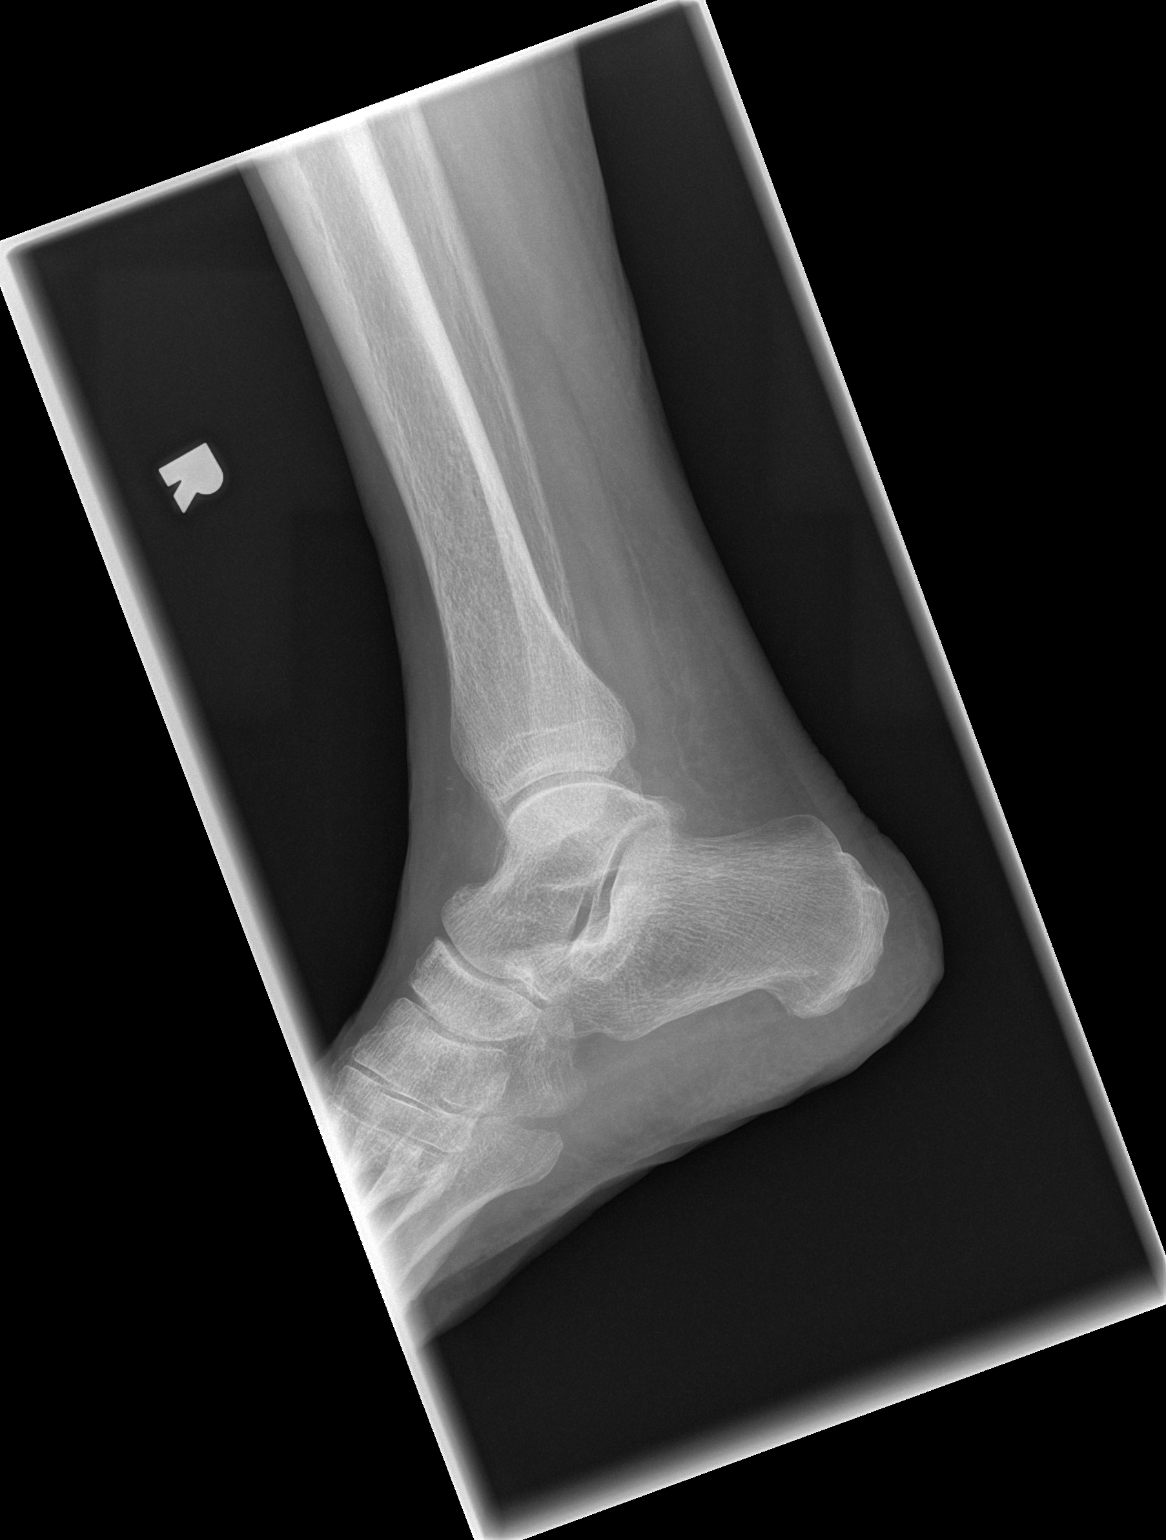

[3 of 3 positions shown; findings below may reference images not displayed]

FINDINGS: Three views of the right ankle submitted.  Again noted
healing nondisplaced fracture in the distal right fibula without
change in alignment.  Persistent mild adjacent soft tissue
swelling.  Ankle mortise is preserved..
IMPRESSION: Again noted healing nondisplaced fracture in the distal right
fibula without change in alignment.  Persistent mild adjacent soft
tissue swelling.  Ankle mortise is preserved..]

## 2014-02-20 ENCOUNTER — Encounter: Payer: Self-pay | Admitting: Hematology and Oncology

## 2014-02-20 ENCOUNTER — Other Ambulatory Visit (HOSPITAL_BASED_OUTPATIENT_CLINIC_OR_DEPARTMENT_OTHER): Payer: Medicare Other

## 2014-02-20 ENCOUNTER — Ambulatory Visit (HOSPITAL_BASED_OUTPATIENT_CLINIC_OR_DEPARTMENT_OTHER): Payer: Medicare Other | Admitting: Hematology and Oncology

## 2014-02-20 ENCOUNTER — Telehealth: Payer: Self-pay | Admitting: *Deleted

## 2014-02-20 VITALS — BP 171/69 | HR 71 | Temp 98.2°F | Resp 18 | Ht 62.0 in | Wt 113.9 lb

## 2014-02-20 DIAGNOSIS — D638 Anemia in other chronic diseases classified elsewhere: Secondary | ICD-10-CM

## 2014-02-20 DIAGNOSIS — C8589 Other specified types of non-Hodgkin lymphoma, extranodal and solid organ sites: Secondary | ICD-10-CM

## 2014-02-20 LAB — CBC WITH DIFFERENTIAL/PLATELET
BASO%: 1.7 % (ref 0.0–2.0)
Basophils Absolute: 0.1 10*3/uL (ref 0.0–0.1)
EOS%: 2.6 % (ref 0.0–7.0)
Eosinophils Absolute: 0.1 10*3/uL (ref 0.0–0.5)
HCT: 38.8 % (ref 34.8–46.6)
HGB: 12.7 g/dL (ref 11.6–15.9)
LYMPH#: 1.5 10*3/uL (ref 0.9–3.3)
LYMPH%: 31 % (ref 14.0–49.7)
MCH: 31.6 pg (ref 25.1–34.0)
MCHC: 32.7 g/dL (ref 31.5–36.0)
MCV: 96.8 fL (ref 79.5–101.0)
MONO#: 0.4 10*3/uL (ref 0.1–0.9)
MONO%: 8.3 % (ref 0.0–14.0)
NEUT#: 2.8 10*3/uL (ref 1.5–6.5)
NEUT%: 56.4 % (ref 38.4–76.8)
Platelets: 320 10*3/uL (ref 145–400)
RBC: 4.01 10*6/uL (ref 3.70–5.45)
RDW: 15 % — ABNORMAL HIGH (ref 11.2–14.5)
WBC: 5 10*3/uL (ref 3.9–10.3)

## 2014-02-20 LAB — COMPREHENSIVE METABOLIC PANEL (CC13)
ALBUMIN: 3.1 g/dL — AB (ref 3.5–5.0)
ALK PHOS: 72 U/L (ref 40–150)
ALT: 7 U/L (ref 0–55)
AST: 17 U/L (ref 5–34)
Anion Gap: 6 mEq/L (ref 3–11)
BUN: 12.4 mg/dL (ref 7.0–26.0)
CO2: 27 mEq/L (ref 22–29)
Calcium: 9.2 mg/dL (ref 8.4–10.4)
Chloride: 109 mEq/L (ref 98–109)
Creatinine: 0.7 mg/dL (ref 0.6–1.1)
Glucose: 80 mg/dl (ref 70–140)
POTASSIUM: 3.8 meq/L (ref 3.5–5.1)
SODIUM: 142 meq/L (ref 136–145)
TOTAL PROTEIN: 6.1 g/dL — AB (ref 6.4–8.3)
Total Bilirubin: 0.33 mg/dL (ref 0.20–1.20)

## 2014-02-20 LAB — MORPHOLOGY
PLT EST: ADEQUATE
RBC Comments: NORMAL

## 2014-02-20 LAB — LACTATE DEHYDROGENASE (CC13): LDH: 160 U/L (ref 125–245)

## 2014-02-20 NOTE — Assessment & Plan Note (Signed)
She is doing well with no signs of lymphoma recurrence. I will continue close observation with history, physical examination and blood work next year. I reinforced the importance of vaccination against influenza.   

## 2014-02-20 NOTE — Telephone Encounter (Signed)
Message copied by Cathlean Cower on Tue Feb 20, 2014  3:02 PM ------      Message from: [facility], [place]: Tue Feb 20, 2014  1:40 PM      Regarding: kidney fucntion test       Pls let her know kidneys are OK and normal ------

## 2014-02-20 NOTE — Telephone Encounter (Signed)
Informed pt of kidney function wnl.  Pt verbalized understanding.

## 2014-02-20 NOTE — Progress Notes (Signed)
[facility] OFFICE PROGRESS NOTE  Patient Care Team: Laurel Dimmer, MD as PCP - General (Family Medicine)  SUMMARY OF ONCOLOGIC HISTORY: Oncology History   LYMPHOMA, lymphoplasmacytic    Primary site: Lymphoid Neoplasms   Staging method: AJCC 6th Edition   Clinical: Stage III signed by Heath Lark, MD on 11/28/2013  9:06 PM   Summary: Stage III A Initial IPI score of 2 Diagnosed elsewhere, CD 20 positive     LYMPHOMA   05/31/1995 Imaging Date approximate. Ct imaging for hematuria showed large retroperitoneal mass with left ureter involvment   06/01/1995 Surgery She had laparotomy and bilateral salpingo-oophorectomy and biopsy to be proven to be lymphoma.   06/07/1995 - 11/04/1995 Chemotherapy She completed 6 cycles of CHOP plus etoposide chemotherapy   01/04/2004 Imaging Imaging showed recurrence, confirmed on PET/CT scan.   01/07/2004 Procedure Repeat biopsy confirmed CD20 positive lymphoma   01/14/2004 - 03/08/2004 Chemotherapy She received single agent rituximab with no benefit.   03/27/2004 - 09/16/2004 Chemotherapy She received 6 cycles of chemotherapy with RCVP with stable disease   01/20/2012 Imaging Repeat CT scan showed new onset of left hydronephrosis and recurrence of soft tissue mass   02/08/2012 Imaging That CT scan confirmed abnormalities   02/17/2012 Procedure Repeat biopsy confirmed recurrent lymphoplasmacytic lymphoma   03/04/2012 - 07/14/2012 Chemotherapy She received 6 more cycles of RCVP treatment   07/31/2012 Imaging Repeat PET scan showed improved disease control   11/03/2012 Imaging CT scan showed stable soft tissue mass with no recurrence   09/08/2013 Imaging Repeat CT scan showed no evidence of recurrence   11/07/2013 - 11/17/2013 Hospital Admission She was admitted to the hospital with anemia and thrombocytopenia related to UTI, resolved. She also has acute on chronic renal failure   11/12/2013 Imaging CT scan show no evidence of disease recurrence    INTERVAL  HISTORY: Please see below for problem oriented charting. The patient has recovered from recent infection. Denies new lymphadenopathy.  REVIEW OF SYSTEMS:   Constitutional: Denies fevers, chills or abnormal weight loss Eyes: Denies blurriness of vision Ears, nose, mouth, throat, and face: Denies mucositis or sore throat Respiratory: Denies cough, dyspnea or wheezes Cardiovascular: Denies palpitation, chest discomfort or lower extremity swelling Gastrointestinal:  Denies nausea, heartburn or change in bowel habits Skin: Denies abnormal skin rashes Lymphatics: Denies new lymphadenopathy or easy bruising Neurological:Denies numbness, tingling or new weaknesses Behavioral/Psych: Mood is stable, no new changes  All other systems were reviewed with the patient and are negative.  I have reviewed the past medical history, past surgical history, social history and family history with the patient and they are unchanged from previous note.  ALLERGIES:  is allergic to tramadol; ezetimibe; hydrocodone-acetaminophen; nitrofurantoin; other; oxycodone hcl; and sulfonamide derivatives.  MEDICATIONS:  Current Outpatient Prescriptions  Medication Sig Dispense Refill  . aspirin EC 81 MG tablet Take 1 tablet (81 mg total) by mouth daily.  90 tablet  3  . carvedilol (COREG) 3.125 MG tablet Take 1 tablet (3.125 mg total) by mouth 2 (two) times daily with a meal.  180 tablet  4  . levothyroxine (SYNTHROID, LEVOTHROID) 25 MCG tablet Take 25 mcg by mouth daily before breakfast.      . Multiple Vitamin (MULTIVITAMIN WITH MINERALS) TABS tablet Take 1 tablet by mouth daily.      . sennosides-docusate sodium (SENOKOT-S) 8.6-50 MG tablet Take 1 tablet by mouth 2 (two) times daily.      Marland Kitchen spironolactone (ALDACTONE) 25 MG tablet Take 25  mg by mouth daily.       No current facility-administered medications for this visit.    PHYSICAL EXAMINATION: ECOG PERFORMANCE STATUS: 0 - Asymptomatic  Filed Vitals:   02/20/14  1104  BP: 171/69  Pulse: 71  Temp: 98.2 F (36.8 C)  Resp: 18   Filed Weights   02/20/14 1104  Weight: 113 lb 14.4 oz (51.665 kg)    GENERAL:alert, no distress and comfortable SKIN: skin color, texture, turgor are normal, no rashes or significant lesions EYES: normal, Conjunctiva are pink and non-injected, sclera clear OROPHARYNX:no exudate, no erythema and lips, buccal mucosa, and tongue normal  NECK: supple, thyroid normal size, non-tender, without nodularity LYMPH:  no palpable lymphadenopathy in the cervical, axillary or inguinal LUNGS: clear to auscultation and percussion with normal breathing effort HEART: regular rate & rhythm and no murmurs and no lower extremity edema ABDOMEN:abdomen soft, non-tender and normal bowel sounds Musculoskeletal:no cyanosis of digits and no clubbing  NEURO: alert & oriented x 3 with fluent speech, no focal motor/sensory deficits  LABORATORY DATA:  I have reviewed the data as listed    Component Value Date/Time   NA 142 02/20/2014 1050   NA 147 11/17/2013 0535   K 3.8 02/20/2014 1050   K 3.4* 11/17/2013 0535   CL 113* 11/17/2013 0535   CO2 27 02/20/2014 1050   CO2 18* 11/17/2013 0535   GLUCOSE 80 02/20/2014 1050   GLUCOSE 89 11/17/2013 0535   BUN 12.4 02/20/2014 1050   BUN 50* 11/17/2013 0535   CREATININE 0.7 02/20/2014 1050   CREATININE 2.38* 11/17/2013 0535   CALCIUM 9.2 02/20/2014 1050   CALCIUM 7.5* 11/17/2013 0535   PROT 6.1* 02/20/2014 1050   PROT 4.0* 11/17/2013 0535   ALBUMIN 3.1* 02/20/2014 1050   ALBUMIN 1.7* 11/17/2013 0535   AST 17 02/20/2014 1050   AST 19 11/17/2013 0535   ALT 7 02/20/2014 1050   ALT 16 11/17/2013 0535   ALKPHOS 72 02/20/2014 1050   ALKPHOS 54 11/17/2013 0535   BILITOT 0.33 02/20/2014 1050   BILITOT 0.2* 11/17/2013 0535   GFRNONAA 18* 11/17/2013 0535   GFRAA 20* 11/17/2013 0535    No results found for this basename: SPEP, UPEP,  kappa and lambda light chains    Lab Results  Component Value Date   WBC 5.0 02/20/2014    NEUTROABS 2.8 02/20/2014   HGB 12.7 02/20/2014   HCT 38.8 02/20/2014   MCV 96.8 02/20/2014   PLT 320 02/20/2014      Chemistry      Component Value Date/Time   NA 142 02/20/2014 1050   NA 147 11/17/2013 0535   K 3.8 02/20/2014 1050   K 3.4* 11/17/2013 0535   CL 113* 11/17/2013 0535   CO2 27 02/20/2014 1050   CO2 18* 11/17/2013 0535   BUN 12.4 02/20/2014 1050   BUN 50* 11/17/2013 0535   CREATININE 0.7 02/20/2014 1050   CREATININE 2.38* 11/17/2013 0535      Component Value Date/Time   CALCIUM 9.2 02/20/2014 1050   CALCIUM 7.5* 11/17/2013 0535   ALKPHOS 72 02/20/2014 1050   ALKPHOS 54 11/17/2013 0535   AST 17 02/20/2014 1050   AST 19 11/17/2013 0535   ALT 7 02/20/2014 1050   ALT 16 11/17/2013 0535   BILITOT 0.33 02/20/2014 1050   BILITOT 0.2* 11/17/2013 0535      ASSESSMENT & PLAN:  LYMPHOMA She is doing well with no signs of lymphoma recurrence. I will continue close observation with  history, physical examination and blood work next year. I reinforced the importance of vaccination against influenza.   Orders Placed This Encounter  Procedures  . CBC with Differential    Standing Status: Future     Number of Occurrences:      Standing Expiration Date: 03/27/2015  . Comprehensive metabolic panel    Standing Status: Future     Number of Occurrences:      Standing Expiration Date: 03/27/2015  . Lactate dehydrogenase    Standing Status: Future     Number of Occurrences:      Standing Expiration Date: 03/27/2015   All questions were answered. The patient knows to call the clinic with any problems, questions or concerns. No barriers to learning was detected. I spent 15 minutes counseling the patient face to face. The total time spent in the appointment was 20 minutes and more than 50% was on counseling and review of test results     [facility], [place], MD 02/20/2014 8:26 PM

## 2014-02-22 ENCOUNTER — Telehealth: Payer: Self-pay | Admitting: Hematology and Oncology

## 2014-02-22 NOTE — Telephone Encounter (Signed)
lvm for pt regarding to April 2016 appt....mailed pt appt sched/avs and letter °

## 2014-03-14 ENCOUNTER — Telehealth: Payer: Self-pay | Admitting: Cardiology

## 2014-03-14 NOTE — Telephone Encounter (Signed)
Returned call to patient no answer.LMTC. 

## 2014-03-14 NOTE — Telephone Encounter (Signed)
New message           Pt has some information about her health issues and would like for you to research it before she comes 9/29

## 2014-03-19 NOTE — Telephone Encounter (Signed)
Returned call to patient.She stated she wanted to let Dr.Crenshaw and Hilda Blades know she was in [facility] for 10 days.Stated sodium low and too much protein.Stated she had to have physical therapy for 7 weeks.Stated she is much better.She wanted them to know before her appointment with Dr.Crenshaw 04/03/14.

## 2014-03-19 NOTE — Telephone Encounter (Signed)
Pt was calling back to speak to [place] about medical issues. Please call back  Thanks

## 2014-03-20 ENCOUNTER — Other Ambulatory Visit: Payer: Self-pay | Admitting: Cardiology

## 2014-03-21 ENCOUNTER — Telehealth: Payer: Self-pay | Admitting: Cardiology

## 2014-03-21 NOTE — Telephone Encounter (Signed)
Transferred to [facility]

## 2014-03-21 NOTE — Telephone Encounter (Signed)
Pt says she want to have her lab work before she see Dr Stanford Breed on 04-03-14. She wants to make sure her results are back by the time she see him. When does she need to come?j

## 2014-03-21 NOTE — Telephone Encounter (Signed)
Spoke with pt, aware to give 2-3 days for labs to be resulted. Patient voiced understanding

## 2014-04-02 ENCOUNTER — Encounter: Payer: Self-pay | Admitting: Cardiology

## 2014-04-02 ENCOUNTER — Other Ambulatory Visit: Payer: Self-pay | Admitting: *Deleted

## 2014-04-02 DIAGNOSIS — E785 Hyperlipidemia, unspecified: Secondary | ICD-10-CM

## 2014-04-02 DIAGNOSIS — I10 Essential (primary) hypertension: Secondary | ICD-10-CM

## 2014-04-02 DIAGNOSIS — I428 Other cardiomyopathies: Secondary | ICD-10-CM

## 2014-04-02 NOTE — Telephone Encounter (Signed)
This encounter was created in error - please disregard.

## 2014-04-03 ENCOUNTER — Encounter: Payer: Self-pay | Admitting: Cardiology

## 2014-04-03 ENCOUNTER — Ambulatory Visit: Payer: Medicare Other | Admitting: Cardiology

## 2014-04-03 ENCOUNTER — Encounter: Payer: Self-pay | Admitting: *Deleted

## 2014-04-03 ENCOUNTER — Ambulatory Visit (INDEPENDENT_AMBULATORY_CARE_PROVIDER_SITE_OTHER): Payer: Medicare Other | Admitting: Cardiology

## 2014-04-03 VITALS — BP 150/80 | HR 74 | Ht 59.0 in | Wt 112.3 lb

## 2014-04-03 DIAGNOSIS — I10 Essential (primary) hypertension: Secondary | ICD-10-CM

## 2014-04-03 DIAGNOSIS — I428 Other cardiomyopathies: Secondary | ICD-10-CM

## 2014-04-03 LAB — BASIC METABOLIC PANEL WITH GFR
BUN: 17 mg/dL (ref 6–23)
CHLORIDE: 103 meq/L (ref 96–112)
CO2: 28 mEq/L (ref 19–32)
Calcium: 10 mg/dL (ref 8.4–10.5)
Creat: 0.81 mg/dL (ref 0.50–1.10)
GFR, EST AFRICAN AMERICAN: 77 mL/min
GFR, Est Non African American: 67 mL/min
GLUCOSE: 88 mg/dL (ref 70–99)
POTASSIUM: 4.5 meq/L (ref 3.5–5.3)
SODIUM: 139 meq/L (ref 135–145)

## 2014-04-03 LAB — CBC
HEMATOCRIT: 43.2 % (ref 36.0–46.0)
Hemoglobin: 14.2 g/dL (ref 12.0–15.0)
MCH: 31.3 pg (ref 26.0–34.0)
MCHC: 32.9 g/dL (ref 30.0–36.0)
MCV: 95.2 fL (ref 78.0–100.0)
Platelets: 316 10*3/uL (ref 150–400)
RBC: 4.54 MIL/uL (ref 3.87–5.11)
RDW: 14.5 % (ref 11.5–15.5)
WBC: 5.3 10*3/uL (ref 4.0–10.5)

## 2014-04-03 NOTE — Progress Notes (Signed)
      HPI: FU nonischemic cardiomyopathy improved on fu echocardiogram. Echo in Oct of 2012 showed EF 25 to 30, mild LAE and trace AI and MR. Myoview was performed in November of 2012 and showed an ejection fraction of 45%. There was a small partially reversible apical defect suggestive of thinning versus mild ischemia. TSH was normal. Last echocardiogram in September 2014 showed an ejection fraction of 45-40%, grade 1 diastolic dysfunction. Hospitalized with acute renal failure and altered mental status in may of 2015. Since I last saw her She denies dyspnea, chest pain, palpitations or syncope.   Current Outpatient Prescriptions  Medication Sig Dispense Refill  . aspirin EC 81 MG tablet Take 1 tablet (81 mg total) by mouth daily.  90 tablet  3  . carvedilol (COREG) 3.125 MG tablet TAKE 1 TABLET BY MOUTH TWICE DAILY WITH A MEAL  180 tablet  0  . levothyroxine (SYNTHROID, LEVOTHROID) 25 MCG tablet Take 25 mcg by mouth daily before breakfast.      . Multiple Vitamin (MULTIVITAMIN WITH MINERALS) TABS tablet Take 1 tablet by mouth daily.      . sennosides-docusate sodium (SENOKOT-S) 8.6-50 MG tablet Take 1 tablet by mouth 2 (two) times daily.      Marland Kitchen spironolactone (ALDACTONE) 25 MG tablet Take 25 mg by mouth daily.       No current facility-administered medications for this visit.     Past Medical History  Diagnosis Date  . LEFT BUNDLE BRANCH BLOCK   . HYPERTENSION   . HYPERLIPIDEMIA   . CARDIOMYOPATHY   . LYMPHOMA   . DIVERTICULITIS, COLON   . Hemorrhoids, internal   . H/O: hysterectomy 1977  . Maintenance chemotherapy   . Skin cancer     melanoma diagnosed elsewhere 1999  . Cancer     recurrent lymphoma  . Cancer     carcinoid cancer per outside record    Past Surgical History  Procedure Laterality Date  . Appendectomy  1949  . Toe surgery    . Lymphoma biopsies    . Colonoscopy with propofol N/A 11/16/2013    Procedure: COLONOSCOPY WITH PROPOFOL;  Surgeon: [name],  MD;  Location: [facility];  Service: Endoscopy;  Laterality: N/A;    History   Social History  . Marital Status: Widowed    Spouse Name: N/A    Number of Children: N/A  . Years of Education: N/A   Occupational History  . retired Other   Social History Main Topics  . Smoking status: Former Research scientist (life sciences)  . Smokeless tobacco: Never Used  . Alcohol Use: No  . Drug Use: No  . Sexual Activity: Not on file   Other Topics Concern  . Not on file   Social History Narrative  . No narrative on file    ROS: no fevers or chills, productive cough, hemoptysis, dysphasia, odynophagia, melena, hematochezia, dysuria, hematuria, rash, seizure activity, orthopnea, PND, pedal edema, claudication. Remaining systems are negative.  Physical Exam: Well-developed well-nourished in no acute distress.  Skin is warm and dry.  HEENT is normal.  Neck is supple.  Chest is clear to auscultation with normal expansion.  Cardiovascular exam is regular rate and rhythm.  Abdominal exam nontender or distended. No masses palpated. Extremities show no edema. neuro grossly intact  ECG Sinus rhythm at a rate of 74. Cannot rule out prior septal infarct. Inferior lateral T-wave inversion.

## 2014-04-03 NOTE — Assessment & Plan Note (Signed)
If LV function reduced we will add low-dose lisinopril as outlined and cardiomyopathy.

## 2014-04-03 NOTE — Assessment & Plan Note (Signed)
Patient was hospitalized in May with acute renal failure and hyponatremia. Her lisinopril was discontinued. I will repeat her echocardiogram. If LV function is reduced we will resume low dose lisinopril with close attention to followup renal function. Continue carvedilol.

## 2014-04-03 NOTE — Patient Instructions (Signed)

## 2014-04-04 ENCOUNTER — Telehealth: Payer: Self-pay | Admitting: Cardiology

## 2014-04-04 NOTE — Telephone Encounter (Signed)
Pt wanted Cindy Mccormick to give her a call . She says that it is in reference to her office visit yesterday

## 2014-04-04 NOTE — Telephone Encounter (Signed)
Spoke with pt, copy of lab work faxed to PCP at pt request

## 2014-04-18 ENCOUNTER — Ambulatory Visit (HOSPITAL_COMMUNITY)
Admission: RE | Admit: 2014-04-18 | Discharge: 2014-04-18 | Disposition: A | Discharge status: Home / Self Care | Payer: Medicare Other | Source: Ambulatory Visit | Attending: Cardiology | Admitting: Cardiology

## 2014-04-18 DIAGNOSIS — E785 Hyperlipidemia, unspecified: Secondary | ICD-10-CM | POA: Diagnosis not present

## 2014-04-18 DIAGNOSIS — I447 Left bundle-branch block, unspecified: Secondary | ICD-10-CM | POA: Insufficient documentation

## 2014-04-18 DIAGNOSIS — I428 Other cardiomyopathies: Secondary | ICD-10-CM

## 2014-04-18 DIAGNOSIS — I429 Cardiomyopathy, unspecified: Secondary | ICD-10-CM

## 2014-04-18 DIAGNOSIS — Z9221 Personal history of antineoplastic chemotherapy: Secondary | ICD-10-CM | POA: Insufficient documentation

## 2014-04-18 DIAGNOSIS — Z87891 Personal history of nicotine dependence: Secondary | ICD-10-CM | POA: Insufficient documentation

## 2014-04-18 DIAGNOSIS — I10 Essential (primary) hypertension: Secondary | ICD-10-CM | POA: Insufficient documentation

## 2014-04-18 DIAGNOSIS — N179 Acute kidney failure, unspecified: Secondary | ICD-10-CM | POA: Diagnosis not present

## 2014-04-18 NOTE — Progress Notes (Signed)
2D Echocardiogram Complete.  04/18/2014   Cindy Mccormick, [place]

## 2014-04-20 ENCOUNTER — Other Ambulatory Visit: Payer: Self-pay | Admitting: *Deleted

## 2014-04-20 DIAGNOSIS — I10 Essential (primary) hypertension: Secondary | ICD-10-CM

## 2014-04-20 MED ORDER — LISINOPRIL 2.5 MG PO TABS: 2.5000 mg | ORAL_TABLET | Freq: Every day | ORAL | Status: DC

## 2014-05-01 LAB — BASIC METABOLIC PANEL WITH GFR
BUN: 21 mg/dL (ref 6–23)
CHLORIDE: 99 meq/L (ref 96–112)
CO2: 30 mEq/L (ref 19–32)
Calcium: 9.7 mg/dL (ref 8.4–10.5)
Creat: 0.78 mg/dL (ref 0.50–1.10)
GFR, EST NON AFRICAN AMERICAN: 70 mL/min
GFR, Est African American: 81 mL/min
Glucose, Bld: 75 mg/dL (ref 70–99)
POTASSIUM: 4.6 meq/L (ref 3.5–5.3)
Sodium: 139 mEq/L (ref 135–145)

## 2014-06-17 ENCOUNTER — Other Ambulatory Visit: Payer: Self-pay | Admitting: Cardiology

## 2014-08-15 ENCOUNTER — Telehealth: Payer: Self-pay | Admitting: Hematology and Oncology

## 2014-08-15 NOTE — Telephone Encounter (Signed)
pt called to confirm appt....done...pt ok adn aware °

## 2014-10-23 ENCOUNTER — Telehealth: Payer: Self-pay | Admitting: *Deleted

## 2014-10-23 DIAGNOSIS — R300 Dysuria: Secondary | ICD-10-CM

## 2014-10-23 NOTE — Telephone Encounter (Signed)
Informed pt of u/a and c&s ordered for tomorrow.

## 2014-10-23 NOTE — Telephone Encounter (Signed)
LAST WEEK PT. HAD SOME DISCOMFORT WHEN SHE URINATED. HER URINE WAS BRIGHT YELLOW BUT NO FOUL ODOR. SHE TOOK CIPRO 250MG   ONE TABLET ON Wednesday,THURSDAY, AND Friday. HER URINE IS CLEAR NOW AND SHE HAS NO SYMPTOMS BUT WOULD LIKE TO GIVE A URINE SPECIMEN TOMORROW WHEN SHE HAS LAB WORK DRAWN. THIS NOTE ROUTED TO [place].

## 2014-10-23 NOTE — Telephone Encounter (Signed)
Cameo, can you pls add UA and UCx opls

## 2014-10-24 ENCOUNTER — Other Ambulatory Visit (HOSPITAL_BASED_OUTPATIENT_CLINIC_OR_DEPARTMENT_OTHER): Payer: Medicare Other

## 2014-10-24 ENCOUNTER — Encounter: Payer: Self-pay | Admitting: Hematology and Oncology

## 2014-10-24 ENCOUNTER — Telehealth: Payer: Self-pay | Admitting: Hematology and Oncology

## 2014-10-24 ENCOUNTER — Ambulatory Visit (HOSPITAL_BASED_OUTPATIENT_CLINIC_OR_DEPARTMENT_OTHER): Payer: Medicare Other | Admitting: Hematology and Oncology

## 2014-10-24 VITALS — BP 157/71 | HR 69 | Temp 98.1°F | Resp 17 | Ht 59.0 in | Wt 114.4 lb

## 2014-10-24 DIAGNOSIS — C8599 Non-Hodgkin lymphoma, unspecified, extranodal and solid organ sites: Secondary | ICD-10-CM

## 2014-10-24 DIAGNOSIS — Z8572 Personal history of non-Hodgkin lymphomas: Secondary | ICD-10-CM | POA: Diagnosis not present

## 2014-10-24 DIAGNOSIS — C8590 Non-Hodgkin lymphoma, unspecified, unspecified site: Secondary | ICD-10-CM

## 2014-10-24 DIAGNOSIS — R300 Dysuria: Secondary | ICD-10-CM

## 2014-10-24 DIAGNOSIS — I10 Essential (primary) hypertension: Secondary | ICD-10-CM

## 2014-10-24 LAB — COMPREHENSIVE METABOLIC PANEL (CC13)
ALT: 12 U/L (ref 0–55)
ANION GAP: 11 meq/L (ref 3–11)
AST: 17 U/L (ref 5–34)
Albumin: 3.3 g/dL — ABNORMAL LOW (ref 3.5–5.0)
Alkaline Phosphatase: 66 U/L (ref 40–150)
BUN: 15.7 mg/dL (ref 7.0–26.0)
CHLORIDE: 103 meq/L (ref 98–109)
CO2: 25 mEq/L (ref 22–29)
Calcium: 9.4 mg/dL (ref 8.4–10.4)
Creatinine: 0.8 mg/dL (ref 0.6–1.1)
EGFR: 70 mL/min/{1.73_m2} — AB (ref >90)
GLUCOSE: 87 mg/dL (ref 70–140)
POTASSIUM: 4.6 meq/L (ref 3.5–5.1)
SODIUM: 139 meq/L (ref 136–145)
TOTAL PROTEIN: 6.2 g/dL — AB (ref 6.4–8.3)
Total Bilirubin: 0.33 mg/dL (ref 0.20–1.20)

## 2014-10-24 LAB — CBC WITH DIFFERENTIAL/PLATELET
BASO%: 1 % (ref 0.0–2.0)
Basophils Absolute: 0.1 10*3/uL (ref 0.0–0.1)
EOS%: 2.3 % (ref 0.0–7.0)
Eosinophils Absolute: 0.2 10*3/uL (ref 0.0–0.5)
HCT: 38.6 % (ref 34.8–46.6)
HEMOGLOBIN: 12.7 g/dL (ref 11.6–15.9)
LYMPH#: 1.9 10*3/uL (ref 0.9–3.3)
LYMPH%: 29.3 % (ref 14.0–49.7)
MCH: 31.3 pg (ref 25.1–34.0)
MCHC: 33 g/dL (ref 31.5–36.0)
MCV: 95.1 fL (ref 79.5–101.0)
MONO#: 0.4 10*3/uL (ref 0.1–0.9)
MONO%: 6.6 % (ref 0.0–14.0)
NEUT#: 4.1 10*3/uL (ref 1.5–6.5)
NEUT%: 60.8 % (ref 38.4–76.8)
Platelets: 348 10*3/uL (ref 145–400)
RBC: 4.06 10*6/uL (ref 3.70–5.45)
RDW: 14.3 % (ref 11.2–14.5)
WBC: 6.7 10*3/uL (ref 3.9–10.3)

## 2014-10-24 LAB — URINALYSIS, MICROSCOPIC - CHCC
Bilirubin (Urine): NEGATIVE
Glucose: NEGATIVE mg/dL
Ketones: NEGATIVE mg/dL
LEUKOCYTE ESTERASE: NEGATIVE
NITRITE: NEGATIVE
PROTEIN: 30 mg/dL
RBC / HPF: NEGATIVE (ref 0–2)
Specific Gravity, Urine: 1.005 (ref 1.003–1.035)
UROBILINOGEN UR: 0.2 mg/dL (ref 0.2–1)
WBC, UA: NEGATIVE (ref 0–2)
pH: 7.5 (ref 4.6–8.0)

## 2014-10-24 LAB — LACTATE DEHYDROGENASE (CC13): LDH: 160 U/L (ref 125–245)

## 2014-10-24 NOTE — Telephone Encounter (Signed)
Gave avs & calendar for October. °

## 2014-10-25 LAB — URINE CULTURE

## 2014-10-25 NOTE — Assessment & Plan Note (Signed)
The blood pressure is slightly high. I recommend close follow-up with PCP

## 2014-10-25 NOTE — Assessment & Plan Note (Signed)
She is doing well with no signs of lymphoma recurrence. I will continue close observation with history, physical examination and blood work next year. I reinforced the importance of vaccination against influenza.

## 2014-10-25 NOTE — Progress Notes (Signed)
[place] OFFICE PROGRESS NOTE  Patient Care Team: Josetta Huddle, MD as PCP - General (Internal Medicine)  SUMMARY OF ONCOLOGIC HISTORY: Oncology History   LYMPHOMA, lymphoplasmacytic    Primary site: Lymphoid Neoplasms   Staging method: AJCC 6th Edition   Clinical: Stage III signed by Heath Lark, MD on 11/28/2013  9:06 PM   Summary: Stage III A Initial IPI score of 2 Diagnosed elsewhere, CD 20 positive     Non-Hodgkin lymphoma   05/31/1995 Imaging Date approximate. Ct imaging for hematuria showed large retroperitoneal mass with left ureter involvment   06/01/1995 Surgery She had laparotomy and bilateral salpingo-oophorectomy and biopsy to be proven to be lymphoma.   06/07/1995 - 11/04/1995 Chemotherapy She completed 6 cycles of CHOP plus etoposide chemotherapy   01/04/2004 Imaging Imaging showed recurrence, confirmed on PET/CT scan.   01/07/2004 Procedure Repeat biopsy confirmed CD20 positive lymphoma   01/14/2004 - 03/08/2004 Chemotherapy She received single agent rituximab with no benefit.   03/27/2004 - 09/16/2004 Chemotherapy She received 6 cycles of chemotherapy with RCVP with stable disease   01/20/2012 Imaging Repeat CT scan showed new onset of left hydronephrosis and recurrence of soft tissue mass   02/08/2012 Imaging That CT scan confirmed abnormalities   02/17/2012 Procedure Repeat biopsy confirmed recurrent lymphoplasmacytic lymphoma   03/04/2012 - 07/14/2012 Chemotherapy She received 6 more cycles of RCVP treatment   07/31/2012 Imaging Repeat PET scan showed improved disease control   11/03/2012 Imaging CT scan showed stable soft tissue mass with no recurrence   09/08/2013 Imaging Repeat CT scan showed no evidence of recurrence   11/07/2013 - 11/17/2013 Hospital Admission She was admitted to the hospital with anemia and thrombocytopenia related to UTI, resolved. She also has acute on chronic renal failure   11/12/2013 Imaging CT scan show no evidence of disease recurrence     INTERVAL HISTORY: Please see below for problem oriented charting. She is doing well. Denies recent new lymphadenopathy. Denies anorexia or weight loss.  REVIEW OF SYSTEMS:   Constitutional: Denies fevers, chills or abnormal weight loss Eyes: Denies blurriness of vision Ears, nose, mouth, throat, and face: Denies mucositis or sore throat Respiratory: Denies cough, dyspnea or wheezes Cardiovascular: Denies palpitation, chest discomfort or lower extremity swelling Gastrointestinal:  Denies nausea, heartburn or change in bowel habits Skin: Denies abnormal skin rashes Lymphatics: Denies new lymphadenopathy or easy bruising Neurological:Denies numbness, tingling or new weaknesses Behavioral/Psych: Mood is stable, no new changes  All other systems were reviewed with the patient and are negative.  I have reviewed the past medical history, past surgical history, social history and family history with the patient and they are unchanged from previous note.  ALLERGIES:  is allergic to tramadol; ezetimibe; hydrocodone-acetaminophen; nitrofurantoin; other; oxycodone hcl; sulfonamide derivatives; and torsemide.  MEDICATIONS:  Current Outpatient Prescriptions  Medication Sig Dispense Refill  . aspirin EC 81 MG tablet Take 1 tablet (81 mg total) by mouth daily. 90 tablet 3  . carvedilol (COREG) 3.125 MG tablet TAKE 1 TABLET BY MOUTH TWICE DAILY WITH MEALS 180 tablet 2  . levothyroxine (SYNTHROID, LEVOTHROID) 25 MCG tablet Take 25 mcg by mouth daily before breakfast.    . lisinopril (PRINIVIL,ZESTRIL) 2.5 MG tablet Take 1 tablet (2.5 mg total) by mouth daily. 90 tablet 3  . Multiple Vitamin (MULTIVITAMIN WITH MINERALS) TABS tablet Take 1 tablet by mouth daily.    . sennosides-docusate sodium (SENOKOT-S) 8.6-50 MG tablet Take 1 tablet by mouth 2 (two) times daily.    Marland Kitchen  spironolactone (ALDACTONE) 25 MG tablet Take 50 mg by mouth daily.      No current facility-administered medications for this  visit.    PHYSICAL EXAMINATION: ECOG PERFORMANCE STATUS: 0 - Asymptomatic  Filed Vitals:   10/24/14 1021  BP: 157/71  Pulse: 69  Temp: 98.1 F (36.7 C)  Resp: 17   Filed Weights   10/24/14 1021  Weight: 114 lb 6.4 oz (51.891 kg)    GENERAL:alert, no distress and comfortable SKIN: skin color, texture, turgor are normal, no rashes or significant lesions EYES: normal, Conjunctiva are pink and non-injected, sclera clear OROPHARYNX:no exudate, no erythema and lips, buccal mucosa, and tongue normal  NECK: supple, thyroid normal size, non-tender, without nodularity LYMPH:  no palpable lymphadenopathy in the cervical, axillary or inguinal LUNGS: clear to auscultation and percussion with normal breathing effort HEART: regular rate & rhythm and no murmurs and no lower extremity edema ABDOMEN:abdomen soft, non-tender and normal bowel sounds Musculoskeletal:no cyanosis of digits and no clubbing  NEURO: alert & oriented x 3 with fluent speech, no focal motor/sensory deficits  LABORATORY DATA:  I have reviewed the data as listed    Component Value Date/Time   NA 139 10/24/2014 1008   NA 139 04/30/2014 1103   K 4.6 10/24/2014 1008   K 4.6 04/30/2014 1103   CL 99 04/30/2014 1103   CO2 25 10/24/2014 1008   CO2 30 04/30/2014 1103   GLUCOSE 87 10/24/2014 1008   GLUCOSE 75 04/30/2014 1103   BUN 15.7 10/24/2014 1008   BUN 21 04/30/2014 1103   CREATININE 0.8 10/24/2014 1008   CREATININE 0.78 04/30/2014 1103   CREATININE 2.38* 11/17/2013 0535   CALCIUM 9.4 10/24/2014 1008   CALCIUM 9.7 04/30/2014 1103   PROT 6.2* 10/24/2014 1008   PROT 4.0* 11/17/2013 0535   ALBUMIN 3.3* 10/24/2014 1008   ALBUMIN 1.7* 11/17/2013 0535   AST 17 10/24/2014 1008   AST 19 11/17/2013 0535   ALT 12 10/24/2014 1008   ALT 16 11/17/2013 0535   ALKPHOS 66 10/24/2014 1008   ALKPHOS 54 11/17/2013 0535   BILITOT 0.33 10/24/2014 1008   BILITOT 0.2* 11/17/2013 0535   GFRNONAA 70 04/30/2014 1103   GFRNONAA  18* 11/17/2013 0535   GFRAA 81 04/30/2014 1103   GFRAA 20* 11/17/2013 0535    No results found for: SPEP, UPEP  Lab Results  Component Value Date   WBC 6.7 10/24/2014   NEUTROABS 4.1 10/24/2014   HGB 12.7 10/24/2014   HCT 38.6 10/24/2014   MCV 95.1 10/24/2014   PLT 348 10/24/2014      Chemistry      Component Value Date/Time   NA 139 10/24/2014 1008   NA 139 04/30/2014 1103   K 4.6 10/24/2014 1008   K 4.6 04/30/2014 1103   CL 99 04/30/2014 1103   CO2 25 10/24/2014 1008   CO2 30 04/30/2014 1103   BUN 15.7 10/24/2014 1008   BUN 21 04/30/2014 1103   CREATININE 0.8 10/24/2014 1008   CREATININE 0.78 04/30/2014 1103   CREATININE 2.38* 11/17/2013 0535      Component Value Date/Time   CALCIUM 9.4 10/24/2014 1008   CALCIUM 9.7 04/30/2014 1103   ALKPHOS 66 10/24/2014 1008   ALKPHOS 54 11/17/2013 0535   AST 17 10/24/2014 1008   AST 19 11/17/2013 0535   ALT 12 10/24/2014 1008   ALT 16 11/17/2013 0535   BILITOT 0.33 10/24/2014 1008   BILITOT 0.2* 11/17/2013 0535      ASSESSMENT &  PLAN:  Non-Hodgkin lymphoma She is doing well with no signs of lymphoma recurrence. I will continue close observation with history, physical examination and blood work next year. I reinforced the importance of vaccination against influenza.     Essential hypertension The blood pressure is slightly high. I recommend close follow-up with PCP    Orders Placed This Encounter  Procedures  . CBC with Differential/Platelet    Standing Status: Future     Number of Occurrences:      Standing Expiration Date: 11/28/2015  . Comprehensive metabolic panel    Standing Status: Future     Number of Occurrences:      Standing Expiration Date: 11/28/2015  . Lactate dehydrogenase    Standing Status: Future     Number of Occurrences:      Standing Expiration Date: 11/28/2015   All questions were answered. The patient knows to call the clinic with any problems, questions or concerns. No barriers to  learning was detected. I spent 15 minutes counseling the patient face to face. The total time spent in the appointment was 20 minutes and more than 50% was on counseling and review of test results     [facility], [place], MD 10/25/2014 10:41 AM

## 2015-01-06 ENCOUNTER — Other Ambulatory Visit: Payer: Self-pay | Admitting: Cardiology

## 2015-01-08 ENCOUNTER — Other Ambulatory Visit: Payer: Self-pay

## 2015-01-08 MED ORDER — CARVEDILOL 3.125 MG PO TABS: 3.1250 mg | ORAL_TABLET | Freq: Two times a day (BID) | ORAL | Status: DC

## 2015-04-06 ENCOUNTER — Other Ambulatory Visit: Payer: Self-pay | Admitting: Cardiology

## 2015-04-11 ENCOUNTER — Telehealth: Payer: Self-pay | Admitting: Hematology and Oncology

## 2015-04-11 NOTE — Telephone Encounter (Signed)
Patient called to r/s 10/25 lab/fu to 10/18 due to see will be out of town - patient has new date/time.

## 2015-04-19 ENCOUNTER — Encounter: Payer: Self-pay | Admitting: *Deleted

## 2015-04-21 NOTE — Progress Notes (Signed)
      HPI: FU nonischemic cardiomyopathy improved on fu echocardiogram. Echo in Oct of 2012 showed EF 25 to 30, mild LAE and trace AI and MR. Myoview was performed in November of 2012 and showed an ejection fraction of 45%. There was a small partially reversible apical defect suggestive of thinning versus mild ischemia. TSH was normal. Last echocardiogram in Oct 2015 showed EF 77-93, grade 1 diastolic dysfunction. Since I last saw her She has some dizziness but denies dyspnea, chest pain, palpitations or syncope.  Current Outpatient Prescriptions  Medication Sig Dispense Refill  . aspirin EC 81 MG tablet Take 1 tablet (81 mg total) by mouth daily. 90 tablet 3  . carvedilol (COREG) 3.125 MG tablet Take 1 tablet (3.125 mg total) by mouth 2 (two) times daily with a meal. 180 tablet 0  . levothyroxine (SYNTHROID, LEVOTHROID) 25 MCG tablet Take 25 mcg by mouth daily before breakfast.    . lisinopril (PRINIVIL,ZESTRIL) 2.5 MG tablet TAKE 1 TABLET BY MOUTH DAILY 90 tablet 3  . meclizine (ANTIVERT) 25 MG tablet Take 2 tablets by mouth daily.  0  . Multiple Vitamin (MULTIVITAMIN WITH MINERALS) TABS tablet Take 1 tablet by mouth daily.    . sennosides-docusate sodium (SENOKOT-S) 8.6-50 MG tablet Take 1 tablet by mouth 2 (two) times daily.    Marland Kitchen spironolactone (ALDACTONE) 25 MG tablet Take 50 mg by mouth daily.      No current facility-administered medications for this visit.     Past Medical History  Diagnosis Date  . LEFT BUNDLE BRANCH BLOCK   . HYPERTENSION   . HYPERLIPIDEMIA   . CARDIOMYOPATHY   . LYMPHOMA   . DIVERTICULITIS, COLON   . Hemorrhoids, internal   . H/O: hysterectomy 1977  . Maintenance chemotherapy   . Skin cancer     melanoma diagnosed elsewhere 1999  . Cancer [facility])     recurrent lymphoma  . Cancer [facility])     carcinoid cancer per outside record    Past Surgical History  Procedure Laterality Date  . Appendectomy  1949  . Toe surgery    . Lymphoma biopsies    .  Colonoscopy with propofol N/A 11/16/2013    Procedure: COLONOSCOPY WITH PROPOFOL;  Surgeon: [name], MD;  Location: [place];  Service: Endoscopy;  Laterality: N/A;    Social History   Social History  . Marital Status: Widowed    Spouse Name: N/A  . Number of Children: N/A  . Years of Education: N/A   Occupational History  . retired Other   Social History Main Topics  . Smoking status: Former Research scientist (life sciences)  . Smokeless tobacco: Never Used  . Alcohol Use: No  . Drug Use: No  . Sexual Activity: Not on file   Other Topics Concern  . Not on file   Social History Narrative    ROS: no fevers or chills, productive cough, hemoptysis, dysphasia, odynophagia, melena, hematochezia, dysuria, hematuria, rash, seizure activity, orthopnea, PND, pedal edema, claudication. Remaining systems are negative.  Physical Exam: Well-developed well-nourished in no acute distress.  Skin is warm and dry.  HEENT is normal.  Neck is supple.  Chest is clear to auscultation with normal expansion.  Cardiovascular exam is regular rate and rhythm.  Abdominal exam nontender or distended. No masses palpated. Extremities show no edema. neuro grossly intact  ECG Sinus rhythm at a rate of 55. Prior septal and lateral infarct. Inferior lateral T-wave inversion.

## 2015-04-22 ENCOUNTER — Ambulatory Visit (INDEPENDENT_AMBULATORY_CARE_PROVIDER_SITE_OTHER): Payer: Medicare Other | Admitting: Cardiology

## 2015-04-22 ENCOUNTER — Encounter: Payer: Self-pay | Admitting: Cardiology

## 2015-04-22 VITALS — BP 140/84 | HR 55 | Ht 60.0 in | Wt 113.7 lb

## 2015-04-22 DIAGNOSIS — I10 Essential (primary) hypertension: Secondary | ICD-10-CM | POA: Diagnosis not present

## 2015-04-22 NOTE — Assessment & Plan Note (Signed)
Management per primary care. 

## 2015-04-22 NOTE — Patient Instructions (Signed)
Your physician wants you to follow-up in: ONE YEAR WITH DR CRENSHAW You will receive a reminder letter in the mail two months in advance. If you don't receive a letter, please call our office to schedule the follow-up appointment.  

## 2015-04-22 NOTE — Assessment & Plan Note (Signed)
Continue ACE inhibitor, beta blocker and low-dose spironolactone. I will not advance medications as she has had problems with dizziness.

## 2015-04-22 NOTE — Assessment & Plan Note (Signed)
Blood pressure controlled. Continue present medications. 

## 2015-04-23 ENCOUNTER — Other Ambulatory Visit (HOSPITAL_BASED_OUTPATIENT_CLINIC_OR_DEPARTMENT_OTHER): Payer: Medicare Other

## 2015-04-23 ENCOUNTER — Ambulatory Visit (HOSPITAL_BASED_OUTPATIENT_CLINIC_OR_DEPARTMENT_OTHER): Payer: Medicare Other | Admitting: Hematology and Oncology

## 2015-04-23 ENCOUNTER — Telehealth: Payer: Self-pay | Admitting: Hematology and Oncology

## 2015-04-23 VITALS — BP 167/71 | HR 62 | Temp 97.9°F | Resp 18 | Ht 60.0 in | Wt 112.5 lb

## 2015-04-23 DIAGNOSIS — Z8572 Personal history of non-Hodgkin lymphomas: Secondary | ICD-10-CM

## 2015-04-23 DIAGNOSIS — C8590 Non-Hodgkin lymphoma, unspecified, unspecified site: Secondary | ICD-10-CM

## 2015-04-23 DIAGNOSIS — I10 Essential (primary) hypertension: Secondary | ICD-10-CM

## 2015-04-23 LAB — CBC WITH DIFFERENTIAL/PLATELET
BASO%: 1.5 % (ref 0.0–2.0)
BASOS ABS: 0.1 10*3/uL (ref 0.0–0.1)
EOS%: 3.1 % (ref 0.0–7.0)
Eosinophils Absolute: 0.2 10*3/uL (ref 0.0–0.5)
HCT: 36.4 % (ref 34.8–46.6)
HGB: 12.1 g/dL (ref 11.6–15.9)
LYMPH#: 1.9 10*3/uL (ref 0.9–3.3)
LYMPH%: 35.7 % (ref 14.0–49.7)
MCH: 31.4 pg (ref 25.1–34.0)
MCHC: 33.3 g/dL (ref 31.5–36.0)
MCV: 94.4 fL (ref 79.5–101.0)
MONO#: 0.4 10*3/uL (ref 0.1–0.9)
MONO%: 7.9 % (ref 0.0–14.0)
NEUT#: 2.8 10*3/uL (ref 1.5–6.5)
NEUT%: 51.8 % (ref 38.4–76.8)
Platelets: 322 10*3/uL (ref 145–400)
RBC: 3.85 10*6/uL (ref 3.70–5.45)
RDW: 14.8 % — AB (ref 11.2–14.5)
WBC: 5.4 10*3/uL (ref 3.9–10.3)

## 2015-04-23 LAB — COMPREHENSIVE METABOLIC PANEL (CC13)
ALT: 12 U/L (ref 0–55)
AST: 18 U/L (ref 5–34)
Albumin: 3.2 g/dL — ABNORMAL LOW (ref 3.5–5.0)
Alkaline Phosphatase: 69 U/L (ref 40–150)
Anion Gap: 7 mEq/L (ref 3–11)
BUN: 17.3 mg/dL (ref 7.0–26.0)
CO2: 29 meq/L (ref 22–29)
Calcium: 10.2 mg/dL (ref 8.4–10.4)
Chloride: 105 mEq/L (ref 98–109)
Creatinine: 0.8 mg/dL (ref 0.6–1.1)
EGFR: 63 mL/min/{1.73_m2} — ABNORMAL LOW (ref >90)
GLUCOSE: 89 mg/dL (ref 70–140)
POTASSIUM: 4.3 meq/L (ref 3.5–5.1)
SODIUM: 141 meq/L (ref 136–145)
Total Bilirubin: 0.34 mg/dL (ref 0.20–1.20)
Total Protein: 6.2 g/dL — ABNORMAL LOW (ref 6.4–8.3)

## 2015-04-23 LAB — LACTATE DEHYDROGENASE (CC13): LDH: 140 U/L (ref 125–245)

## 2015-04-23 NOTE — Telephone Encounter (Signed)
Gave and printed appt sched and avs for pt for June °

## 2015-04-24 ENCOUNTER — Encounter: Payer: Self-pay | Admitting: Hematology and Oncology

## 2015-04-24 NOTE — Assessment & Plan Note (Signed)
The blood pressure is slightly high. I recommend close follow-up with PCP 

## 2015-04-24 NOTE — Assessment & Plan Note (Signed)
She is doing well with no signs of lymphoma recurrence. I will continue close observation with history, physical examination and blood work next year.

## 2015-04-24 NOTE — Progress Notes (Signed)
[place] OFFICE PROGRESS NOTE  Patient Care Team: Josetta Huddle, MD as PCP - General (Internal Medicine) Heath Lark, MD as Consulting Physician (Hematology and Oncology)  SUMMARY OF ONCOLOGIC HISTORY: Oncology History   LYMPHOMA, lymphoplasmacytic    Primary site: Lymphoid Neoplasms   Staging method: AJCC 6th Edition   Clinical: Stage III signed by Heath Lark, MD on 11/28/2013  9:06 PM   Summary: Stage III A Initial IPI score of 2 Diagnosed elsewhere, CD 20 positive     History of non-Hodgkin's lymphoma   05/31/1995 Imaging Date approximate. Ct imaging for hematuria showed large retroperitoneal mass with left ureter involvment   06/01/1995 Surgery She had laparotomy and bilateral salpingo-oophorectomy and biopsy to be proven to be lymphoma.   06/07/1995 - 11/04/1995 Chemotherapy She completed 6 cycles of CHOP plus etoposide chemotherapy   01/04/2004 Imaging Imaging showed recurrence, confirmed on PET/CT scan.   01/07/2004 Procedure Repeat biopsy confirmed CD20 positive lymphoma   01/14/2004 - 03/08/2004 Chemotherapy She received single agent rituximab with no benefit.   03/27/2004 - 09/16/2004 Chemotherapy She received 6 cycles of chemotherapy with RCVP with stable disease   01/20/2012 Imaging Repeat CT scan showed new onset of left hydronephrosis and recurrence of soft tissue mass   02/08/2012 Imaging That CT scan confirmed abnormalities   02/17/2012 Procedure Repeat biopsy confirmed recurrent lymphoplasmacytic lymphoma   03/04/2012 - 07/14/2012 Chemotherapy She received 6 more cycles of RCVP treatment   07/31/2012 Imaging Repeat PET scan showed improved disease control   11/03/2012 Imaging CT scan showed stable soft tissue mass with no recurrence   09/08/2013 Imaging Repeat CT scan showed no evidence of recurrence   11/07/2013 - 11/17/2013 Hospital Admission She was admitted to the hospital with anemia and thrombocytopenia related to UTI, resolved. She also has acute on chronic renal failure    11/12/2013 Imaging CT scan show no evidence of disease recurrence    INTERVAL HISTORY: Please see below for problem oriented charting. She feels well. Denies new or recent lymphadenopathy. Denies recent infection.  REVIEW OF SYSTEMS:   Constitutional: Denies fevers, chills or abnormal weight loss Eyes: Denies blurriness of vision Ears, nose, mouth, throat, and face: Denies mucositis or sore throat Respiratory: Denies cough, dyspnea or wheezes Cardiovascular: Denies palpitation, chest discomfort or lower extremity swelling Gastrointestinal:  Denies nausea, heartburn or change in bowel habits Skin: Denies abnormal skin rashes Lymphatics: Denies new lymphadenopathy or easy bruising Neurological:Denies numbness, tingling or new weaknesses Behavioral/Psych: Mood is stable, no new changes  All other systems were reviewed with the patient and are negative.  I have reviewed the past medical history, past surgical history, social history and family history with the patient and they are unchanged from previous note.  ALLERGIES:  is allergic to tramadol; ezetimibe; hydrocodone-acetaminophen; nitrofurantoin; other; oxycodone hcl; sulfonamide derivatives; and torsemide.  MEDICATIONS:  Current Outpatient Prescriptions  Medication Sig Dispense Refill  . aspirin EC 81 MG tablet Take 1 tablet (81 mg total) by mouth daily. 90 tablet 3  . carvedilol (COREG) 3.125 MG tablet Take 1 tablet (3.125 mg total) by mouth 2 (two) times daily with a meal. 180 tablet 0  . levothyroxine (SYNTHROID, LEVOTHROID) 25 MCG tablet Take 25 mcg by mouth daily before breakfast.    . lisinopril (PRINIVIL,ZESTRIL) 2.5 MG tablet TAKE 1 TABLET BY MOUTH DAILY 90 tablet 3  . meclizine (ANTIVERT) 25 MG tablet Take 2 tablets by mouth daily.  0  . Multiple Vitamin (MULTIVITAMIN WITH MINERALS) TABS tablet Take 1  tablet by mouth daily.    . sennosides-docusate sodium (SENOKOT-S) 8.6-50 MG tablet Take 1 tablet by mouth 2 (two) times  daily.    Marland Kitchen spironolactone (ALDACTONE) 25 MG tablet Take 50 mg by mouth daily.      No current facility-administered medications for this visit.    PHYSICAL EXAMINATION: ECOG PERFORMANCE STATUS: 1 - Symptomatic but completely ambulatory  Filed Vitals:   04/23/15 1316  BP: 167/71  Pulse: 62  Temp: 97.9 F (36.6 C)  Resp: 18   Filed Weights   04/23/15 1316  Weight: 112 lb 8 oz (51.03 kg)    GENERAL:alert, no distress and comfortable SKIN: skin color, texture, turgor are normal, no rashes or significant lesions EYES: normal, Conjunctiva are pink and non-injected, sclera clear OROPHARYNX:no exudate, no erythema and lips, buccal mucosa, and tongue normal  NECK: supple, thyroid normal size, non-tender, without nodularity LYMPH:  no palpable lymphadenopathy in the cervical, axillary or inguinal LUNGS: clear to auscultation and percussion with normal breathing effort HEART: regular rate & rhythm and no murmurs and no lower extremity edema ABDOMEN:abdomen soft, non-tender and normal bowel sounds Musculoskeletal:no cyanosis of digits and no clubbing  NEURO: alert & oriented x 3 with fluent speech, no focal motor/sensory deficits  LABORATORY DATA:  I have reviewed the data as listed    Component Value Date/Time   NA 141 04/23/2015 1300   NA 139 04/30/2014 1103   K 4.3 04/23/2015 1300   K 4.6 04/30/2014 1103   CL 99 04/30/2014 1103   CO2 29 04/23/2015 1300   CO2 30 04/30/2014 1103   GLUCOSE 89 04/23/2015 1300   GLUCOSE 75 04/30/2014 1103   BUN 17.3 04/23/2015 1300   BUN 21 04/30/2014 1103   CREATININE 0.8 04/23/2015 1300   CREATININE 0.78 04/30/2014 1103   CREATININE 2.38* 11/17/2013 0535   CALCIUM 10.2 04/23/2015 1300   CALCIUM 9.7 04/30/2014 1103   PROT 6.2* 04/23/2015 1300   PROT 4.0* 11/17/2013 0535   ALBUMIN 3.2* 04/23/2015 1300   ALBUMIN 1.7* 11/17/2013 0535   AST 18 04/23/2015 1300   AST 19 11/17/2013 0535   ALT 12 04/23/2015 1300   ALT 16 11/17/2013 0535    ALKPHOS 69 04/23/2015 1300   ALKPHOS 54 11/17/2013 0535   BILITOT 0.34 04/23/2015 1300   BILITOT 0.2* 11/17/2013 0535   GFRNONAA 70 04/30/2014 1103   GFRNONAA 18* 11/17/2013 0535   GFRAA 81 04/30/2014 1103   GFRAA 20* 11/17/2013 0535    No results found for: SPEP, UPEP  Lab Results  Component Value Date   WBC 5.4 04/23/2015   NEUTROABS 2.8 04/23/2015   HGB 12.1 04/23/2015   HCT 36.4 04/23/2015   MCV 94.4 04/23/2015   PLT 322 04/23/2015      Chemistry      Component Value Date/Time   NA 141 04/23/2015 1300   NA 139 04/30/2014 1103   K 4.3 04/23/2015 1300   K 4.6 04/30/2014 1103   CL 99 04/30/2014 1103   CO2 29 04/23/2015 1300   CO2 30 04/30/2014 1103   BUN 17.3 04/23/2015 1300   BUN 21 04/30/2014 1103   CREATININE 0.8 04/23/2015 1300   CREATININE 0.78 04/30/2014 1103   CREATININE 2.38* 11/17/2013 0535      Component Value Date/Time   CALCIUM 10.2 04/23/2015 1300   CALCIUM 9.7 04/30/2014 1103   ALKPHOS 69 04/23/2015 1300   ALKPHOS 54 11/17/2013 0535   AST 18 04/23/2015 1300   AST 19 11/17/2013 0535  ALT 12 04/23/2015 1300   ALT 16 11/17/2013 0535   BILITOT 0.34 04/23/2015 1300   BILITOT 0.2* 11/17/2013 0535      ASSESSMENT & PLAN:  History of non-Hodgkin's lymphoma She is doing well with no signs of lymphoma recurrence. I will continue close observation with history, physical examination and blood work next year.   Essential hypertension The blood pressure is slightly high. I recommend close follow-up with PCP     Orders Placed This Encounter  Procedures  . CBC with Differential/Platelet    Standing Status: Future     Number of Occurrences:      Standing Expiration Date: 05/27/2016  . Comprehensive metabolic panel    Standing Status: Future     Number of Occurrences:      Standing Expiration Date: 05/27/2016  . Lactate dehydrogenase    Standing Status: Future     Number of Occurrences:      Standing Expiration Date: 05/27/2016   All  questions were answered. The patient knows to call the clinic with any problems, questions or concerns. No barriers to learning was detected. I spent 15 minutes counseling the patient face to face. The total time spent in the appointment was 20 minutes and more than 50% was on counseling and review of test results     [facility], Axis, MD 04/24/2015 7:21 AM

## 2015-04-30 ENCOUNTER — Other Ambulatory Visit: Payer: Self-pay

## 2015-04-30 ENCOUNTER — Ambulatory Visit: Payer: Self-pay | Admitting: Hematology and Oncology

## 2015-07-05 ENCOUNTER — Other Ambulatory Visit: Payer: Self-pay

## 2015-07-05 MED ORDER — CARVEDILOL 3.125 MG PO TABS: 3.1250 mg | ORAL_TABLET | Freq: Two times a day (BID) | ORAL | Status: DC

## 2015-12-24 ENCOUNTER — Other Ambulatory Visit: Payer: Medicare Other

## 2015-12-24 ENCOUNTER — Ambulatory Visit: Payer: Medicare Other | Admitting: Hematology and Oncology

## 2015-12-26 ENCOUNTER — Telehealth: Payer: Self-pay | Admitting: Hematology and Oncology

## 2015-12-26 ENCOUNTER — Telehealth: Payer: Self-pay | Admitting: *Deleted

## 2015-12-26 NOTE — Telephone Encounter (Signed)
Pt left VM states she needs to r/s a missed appt from 6/20. Says she was unaware of this appt but just heard a message about it.  Dr. Alvy Bimler sent request to Scheduler to r/s.

## 2015-12-26 NOTE — Telephone Encounter (Signed)
lvm for pt regarding to July appt. °

## 2016-01-23 ENCOUNTER — Other Ambulatory Visit (HOSPITAL_BASED_OUTPATIENT_CLINIC_OR_DEPARTMENT_OTHER): Payer: Medicare Other

## 2016-01-23 ENCOUNTER — Ambulatory Visit (HOSPITAL_BASED_OUTPATIENT_CLINIC_OR_DEPARTMENT_OTHER): Payer: Medicare Other | Admitting: Hematology and Oncology

## 2016-01-23 ENCOUNTER — Telehealth: Payer: Self-pay | Admitting: Hematology and Oncology

## 2016-01-23 VITALS — BP 151/76 | HR 70 | Temp 98.1°F | Resp 18 | Ht 60.0 in | Wt 113.9 lb

## 2016-01-23 DIAGNOSIS — Z8572 Personal history of non-Hodgkin lymphomas: Secondary | ICD-10-CM

## 2016-01-23 DIAGNOSIS — I10 Essential (primary) hypertension: Secondary | ICD-10-CM | POA: Diagnosis not present

## 2016-01-23 LAB — CBC WITH DIFFERENTIAL/PLATELET
BASO%: 0.9 % (ref 0.0–2.0)
Basophils Absolute: 0.1 10*3/uL (ref 0.0–0.1)
EOS%: 2.6 % (ref 0.0–7.0)
Eosinophils Absolute: 0.2 10*3/uL (ref 0.0–0.5)
HEMATOCRIT: 40.2 % (ref 34.8–46.6)
HEMOGLOBIN: 13.2 g/dL (ref 11.6–15.9)
LYMPH#: 2.6 10*3/uL (ref 0.9–3.3)
LYMPH%: 33.9 % (ref 14.0–49.7)
MCH: 31.1 pg (ref 25.1–34.0)
MCHC: 32.8 g/dL (ref 31.5–36.0)
MCV: 94.8 fL (ref 79.5–101.0)
MONO#: 0.5 10*3/uL (ref 0.1–0.9)
MONO%: 6.1 % (ref 0.0–14.0)
NEUT#: 4.3 10*3/uL (ref 1.5–6.5)
NEUT%: 56.5 % (ref 38.4–76.8)
Platelets: 306 10*3/uL (ref 145–400)
RBC: 4.24 10*6/uL (ref 3.70–5.45)
RDW: 14.5 % (ref 11.2–14.5)
WBC: 7.6 10*3/uL (ref 3.9–10.3)

## 2016-01-23 LAB — COMPREHENSIVE METABOLIC PANEL
ALBUMIN: 3.6 g/dL (ref 3.5–5.0)
ALK PHOS: 74 U/L (ref 40–150)
ALT: 11 U/L (ref 0–55)
AST: 20 U/L (ref 5–34)
Anion Gap: 11 mEq/L (ref 3–11)
BUN: 21.4 mg/dL (ref 7.0–26.0)
CO2: 27 meq/L (ref 22–29)
Calcium: 9.7 mg/dL (ref 8.4–10.4)
Chloride: 102 mEq/L (ref 98–109)
Creatinine: 0.8 mg/dL (ref 0.6–1.1)
EGFR: 65 mL/min/{1.73_m2} — AB (ref >90)
GLUCOSE: 75 mg/dL (ref 70–140)
POTASSIUM: 4.2 meq/L (ref 3.5–5.1)
SODIUM: 139 meq/L (ref 136–145)
TOTAL PROTEIN: 7 g/dL (ref 6.4–8.3)
Total Bilirubin: 0.6 mg/dL (ref 0.20–1.20)

## 2016-01-23 LAB — LACTATE DEHYDROGENASE: LDH: 172 U/L (ref 125–245)

## 2016-01-23 NOTE — Progress Notes (Signed)
[place] OFFICE PROGRESS NOTE  Patient Care Team: Josetta Huddle, MD as PCP - General (Internal Medicine) Heath Lark, MD as Consulting Physician (Hematology and Oncology)  SUMMARY OF ONCOLOGIC HISTORY: Oncology History   LYMPHOMA, lymphoplasmacytic    Primary site: Lymphoid Neoplasms   Staging method: AJCC 6th Edition   Clinical: Stage III signed by Heath Lark, MD on 11/28/2013  9:06 PM   Summary: Stage III A Initial IPI score of 2 Diagnosed elsewhere, CD 20 positive     History of non-Hodgkin's lymphoma   05/31/1995 Imaging Date approximate. Ct imaging for hematuria showed large retroperitoneal mass with left ureter involvment   06/01/1995 Surgery She had laparotomy and bilateral salpingo-oophorectomy and biopsy to be proven to be lymphoma.   06/07/1995 - 11/04/1995 Chemotherapy She completed 6 cycles of CHOP plus etoposide chemotherapy   01/04/2004 Imaging Imaging showed recurrence, confirmed on PET/CT scan.   01/07/2004 Procedure Repeat biopsy confirmed CD20 positive lymphoma   01/14/2004 - 03/08/2004 Chemotherapy She received single agent rituximab with no benefit.   03/27/2004 - 09/16/2004 Chemotherapy She received 6 cycles of chemotherapy with RCVP with stable disease   01/20/2012 Imaging Repeat CT scan showed new onset of left hydronephrosis and recurrence of soft tissue mass   02/08/2012 Imaging That CT scan confirmed abnormalities   02/17/2012 Procedure Repeat biopsy confirmed recurrent lymphoplasmacytic lymphoma   03/04/2012 - 07/14/2012 Chemotherapy She received 6 more cycles of RCVP treatment   07/31/2012 Imaging Repeat PET scan showed improved disease control   11/03/2012 Imaging CT scan showed stable soft tissue mass with no recurrence   09/08/2013 Imaging Repeat CT scan showed no evidence of recurrence   11/07/2013 - 11/17/2013 Hospital Admission She was admitted to the hospital with anemia and thrombocytopenia related to UTI, resolved. She also has acute on chronic renal failure    11/12/2013 Imaging CT scan show no evidence of disease recurrence    INTERVAL HISTORY: Please see below for problem oriented charting. She feels well. No new lymphadenopathy. Denies recent infection. No recent anorexia or change in weight.  REVIEW OF SYSTEMS:   Constitutional: Denies fevers, chills or abnormal weight loss Eyes: Denies blurriness of vision Ears, nose, mouth, throat, and face: Denies mucositis or sore throat Respiratory: Denies cough, dyspnea or wheezes Cardiovascular: Denies palpitation, chest discomfort or lower extremity swelling Gastrointestinal:  Denies nausea, heartburn or change in bowel habits Skin: Denies abnormal skin rashes Lymphatics: Denies new lymphadenopathy or easy bruising Neurological:Denies numbness, tingling or new weaknesses Behavioral/Psych: Mood is stable, no new changes  All other systems were reviewed with the patient and are negative.  I have reviewed the past medical history, past surgical history, social history and family history with the patient and they are unchanged from previous note.  ALLERGIES:  is allergic to tramadol; ezetimibe; hydrocodone-acetaminophen; nitrofurantoin; other; oxycodone hcl; sulfonamide derivatives; and torsemide.  MEDICATIONS:  Current Outpatient Prescriptions  Medication Sig Dispense Refill  . aspirin EC 81 MG tablet Take 1 tablet (81 mg total) by mouth daily. 90 tablet 3  . carvedilol (COREG) 3.125 MG tablet Take 1 tablet (3.125 mg total) by mouth 2 (two) times daily with a meal. 180 tablet 3  . levothyroxine (SYNTHROID, LEVOTHROID) 25 MCG tablet Take 25 mcg by mouth daily before breakfast.    . Multiple Vitamin (MULTIVITAMIN WITH MINERALS) TABS tablet Take 1 tablet by mouth daily.    . sennosides-docusate sodium (SENOKOT-S) 8.6-50 MG tablet Take 1 tablet by mouth 2 (two) times daily.    Marland Kitchen  spironolactone (ALDACTONE) 25 MG tablet Take 50 mg by mouth daily.      No current facility-administered medications  for this visit.    PHYSICAL EXAMINATION: ECOG PERFORMANCE STATUS: 0 - Asymptomatic  Filed Vitals:   01/23/16 1130  BP: 151/76  Pulse: 70  Temp: 98.1 F (36.7 C)  Resp: 18   Filed Weights   01/23/16 1130  Weight: 113 lb 14.4 oz (51.665 kg)    GENERAL:alert, no distress and comfortable SKIN: skin color, texture, turgor are normal, no rashes or significant lesions EYES: normal, Conjunctiva are pink and non-injected, sclera clear OROPHARYNX:no exudate, no erythema and lips, buccal mucosa, and tongue normal  NECK: supple, thyroid normal size, non-tender, without nodularity LYMPH:  no palpable lymphadenopathy in the cervical, axillary or inguinal LUNGS: clear to auscultation and percussion with normal breathing effort HEART: regular rate & rhythm and no murmurs and no lower extremity edema ABDOMEN:abdomen soft, non-tender and normal bowel sounds Musculoskeletal:no cyanosis of digits and no clubbing  NEURO: alert & oriented x 3 with fluent speech, no focal motor/sensory deficits  LABORATORY DATA:  I have reviewed the data as listed    Component Value Date/Time   NA 139 01/23/2016 1115   NA 139 04/30/2014 1103   K 4.2 01/23/2016 1115   K 4.6 04/30/2014 1103   CL 99 04/30/2014 1103   CO2 27 01/23/2016 1115   CO2 30 04/30/2014 1103   GLUCOSE 75 01/23/2016 1115   GLUCOSE 75 04/30/2014 1103   BUN 21.4 01/23/2016 1115   BUN 21 04/30/2014 1103   CREATININE 0.8 01/23/2016 1115   CREATININE 0.78 04/30/2014 1103   CREATININE 2.38* 11/17/2013 0535   CALCIUM 9.7 01/23/2016 1115   CALCIUM 9.7 04/30/2014 1103   PROT 7.0 01/23/2016 1115   PROT 4.0* 11/17/2013 0535   ALBUMIN 3.6 01/23/2016 1115   ALBUMIN 1.7* 11/17/2013 0535   AST 20 01/23/2016 1115   AST 19 11/17/2013 0535   ALT 11 01/23/2016 1115   ALT 16 11/17/2013 0535   ALKPHOS 74 01/23/2016 1115   ALKPHOS 54 11/17/2013 0535   BILITOT 0.60 01/23/2016 1115   BILITOT 0.2* 11/17/2013 0535   GFRNONAA 70 04/30/2014 1103    GFRNONAA 18* 11/17/2013 0535   GFRAA 81 04/30/2014 1103   GFRAA 20* 11/17/2013 0535    No results found for: SPEP, UPEP  Lab Results  Component Value Date   WBC 7.6 01/23/2016   NEUTROABS 4.3 01/23/2016   HGB 13.2 01/23/2016   HCT 40.2 01/23/2016   MCV 94.8 01/23/2016   PLT 306 01/23/2016      Chemistry      Component Value Date/Time   NA 139 01/23/2016 1115   NA 139 04/30/2014 1103   K 4.2 01/23/2016 1115   K 4.6 04/30/2014 1103   CL 99 04/30/2014 1103   CO2 27 01/23/2016 1115   CO2 30 04/30/2014 1103   BUN 21.4 01/23/2016 1115   BUN 21 04/30/2014 1103   CREATININE 0.8 01/23/2016 1115   CREATININE 0.78 04/30/2014 1103   CREATININE 2.38* 11/17/2013 0535      Component Value Date/Time   CALCIUM 9.7 01/23/2016 1115   CALCIUM 9.7 04/30/2014 1103   ALKPHOS 74 01/23/2016 1115   ALKPHOS 54 11/17/2013 0535   AST 20 01/23/2016 1115   AST 19 11/17/2013 0535   ALT 11 01/23/2016 1115   ALT 16 11/17/2013 0535   BILITOT 0.60 01/23/2016 1115   BILITOT 0.2* 11/17/2013 0535      ASSESSMENT &  PLAN:  History of non-Hodgkin's lymphoma She is doing well with no signs of lymphoma recurrence. I will continue close observation with history, physical examination and blood work next year.  Essential hypertension The blood pressure is slightly high. I recommend close follow-up with PCP       Orders Placed This Encounter  Procedures  . CBC with Differential/Platelet    Standing Status: Future     Number of Occurrences:      Standing Expiration Date: 02/26/2017  . Comprehensive metabolic panel    Standing Status: Future     Number of Occurrences:      Standing Expiration Date: 02/26/2017  . Lactate dehydrogenase (LDH)    Standing Status: Future     Number of Occurrences:      Standing Expiration Date: 02/26/2017   All questions were answered. The patient knows to call the clinic with any problems, questions or concerns. No barriers to learning was detected. I spent 15  minutes counseling the patient face to face. The total time spent in the appointment was 20 minutes and more than 50% was on counseling and review of test results     [facility], Aneri Slagel, MD 01/23/2016 4:05 PM

## 2016-01-23 NOTE — Telephone Encounter (Signed)
Gave pt cal & avs °

## 2016-01-24 ENCOUNTER — Encounter: Payer: Self-pay | Admitting: Hematology and Oncology

## 2016-01-24 NOTE — Assessment & Plan Note (Signed)
She is doing well with no signs of lymphoma recurrence. I will continue close observation with history, physical examination and blood work next year.

## 2016-01-24 NOTE — Assessment & Plan Note (Signed)
The blood pressure is slightly high. I recommend close follow-up with PCP 

## 2016-01-27 ENCOUNTER — Encounter: Payer: Self-pay | Admitting: Gastroenterology

## 2016-04-28 ENCOUNTER — Encounter: Payer: Self-pay | Admitting: Cardiology

## 2016-05-06 NOTE — Progress Notes (Signed)
HPI: FU nonischemic cardiomyopathy improved on fu echocardiogram. Echo in Oct of 2012 showed EF 25 to 30, mild LAE and trace AI and MR. Myoview was performed in November of 2012 and showed an ejection fraction of 45%. There was a small partially reversible apical defect suggestive of thinning versus mild ischemia. TSH was normal. Last echocardiogram in Oct 2015 showed EF [id number], grade 1 diastolic dysfunction. Since I last saw her the patient has dyspnea with more extreme activities but not with routine activities. It is relieved with rest. It is not associated with chest pain. There is no orthopnea, PND or pedal edema. There is no syncope or palpitations. There is no exertional chest pain.    Current Outpatient Prescriptions  Medication Sig Dispense Refill  . aspirin EC 81 MG tablet Take 1 tablet (81 mg total) by mouth daily. 90 tablet 3  . carvedilol (COREG) 3.125 MG tablet Take 1 tablet (3.125 mg total) by mouth 2 (two) times daily with a meal. 180 tablet 3  . Multiple Vitamin (MULTIVITAMIN WITH MINERALS) TABS tablet Take 1 tablet by mouth daily.    . sennosides-docusate sodium (SENOKOT-S) 8.6-50 MG tablet Take 1 tablet by mouth 2 (two) times daily.    Marland Kitchen spironolactone (ALDACTONE) 50 MG tablet Take 50 mg by mouth daily.     No current facility-administered medications for this visit.      Past Medical History:  Diagnosis Date  . Cancer [facility])    recurrent lymphoma  . Cancer [facility])    carcinoid cancer per outside record  . CARDIOMYOPATHY   . DIVERTICULITIS, COLON   . H/O: hysterectomy 1977  . Hemorrhoids, internal   . HYPERLIPIDEMIA   . HYPERTENSION   . LEFT BUNDLE BRANCH BLOCK   . LYMPHOMA   . Maintenance chemotherapy   . Skin cancer    melanoma diagnosed elsewhere 1999    Past Surgical History:  Procedure Laterality Date  . APPENDECTOMY  1949  . COLONOSCOPY WITH PROPOFOL N/A 11/16/2013   Procedure: COLONOSCOPY WITH PROPOFOL;  Surgeon: [name], MD;  Location: [place];  Service: Endoscopy;  Laterality: N/A;  . Lymphoma biopsies    . TOE SURGERY      Social History   Social History  . Marital status: Widowed    Spouse name: N/A  . Number of children: N/A  . Years of education: N/A   Occupational History  . retired Other   Social History Main Topics  . Smoking status: Former Research scientist (life sciences)  . Smokeless tobacco: Never Used  . Alcohol use No  . Drug use: No  . Sexual activity: Not on file   Other Topics Concern  . Not on file   Social History Narrative  . No narrative on file    Family History  Problem Relation Age of Onset  . Lymphoma Mother   . Sudden death Father   . Hypertension Neg Hx   . Hyperlipidemia Neg Hx   . Heart attack Neg Hx   . Diabetes Neg Hx     ROS: no fevers or chills, productive cough, hemoptysis, dysphasia, odynophagia, melena, hematochezia, dysuria, hematuria, rash, seizure activity, orthopnea, PND, pedal edema, claudication. Remaining systems are negative.  Physical Exam: Well-developed well-nourished in no acute distress.  Skin is warm and dry.  HEENT is normal.  Neck is supple.  Chest is clear to auscultation with normal expansion.  Cardiovascular exam is regular rate and rhythm.  Abdominal exam nontender or distended. No masses  palpated. Extremities show no edema. neuro grossly intact  ECG-Sinus rhythm at a rate of 60. Left bundle branch block.  A/P  1 hypertension-blood pressure is mildly elevated. I am adding Cozaar 25 mg daily for blood pressure and cardiomyopathy. Check potassium and renal function in 1 week.  2 hyperlipidemia-management per primary care.  3 cardiomyopathy-continue carvedilol. Continue spironolactone. She had a cough with lisinopril and this medication was discontinued. Add Cozaar 25 mg daily. Check potassium and renal function in 1 week. We will be careful with advancing medications as she has had hypertension previously.  Kirk Ruths MD

## 2016-05-12 ENCOUNTER — Ambulatory Visit (INDEPENDENT_AMBULATORY_CARE_PROVIDER_SITE_OTHER): Payer: Medicare Other | Admitting: Cardiology

## 2016-05-12 ENCOUNTER — Encounter: Payer: Self-pay | Admitting: Cardiology

## 2016-05-12 VITALS — BP 152/77 | HR 60 | Ht 60.0 in | Wt 112.0 lb

## 2016-05-12 DIAGNOSIS — I10 Essential (primary) hypertension: Secondary | ICD-10-CM

## 2016-05-12 DIAGNOSIS — E7800 Pure hypercholesterolemia, unspecified: Secondary | ICD-10-CM

## 2016-05-12 DIAGNOSIS — I428 Other cardiomyopathies: Secondary | ICD-10-CM

## 2016-05-12 MED ORDER — LOSARTAN POTASSIUM 25 MG PO TABS: 25.0000 mg | ORAL_TABLET | Freq: Every day | ORAL | 3 refills | Status: DC

## 2016-05-12 NOTE — Patient Instructions (Signed)
Medication Instructions:   START LOSARTAN 25 MG ONCE DAILY  Labwork:  Your physician recommends that you return for lab work in:ONE WEEK  Follow-Up:  Your physician wants you to follow-up in: [place] will receive a reminder letter in the mail two months in advance. If you don't receive a letter, please call our office to schedule the follow-up appointment.   If you need a refill on your cardiac medications before your next appointment, please call your pharmacy.

## 2016-05-21 LAB — BASIC METABOLIC PANEL
BUN: 14 mg/dL (ref 7–25)
CHLORIDE: 104 mmol/L (ref 98–110)
CO2: 28 mmol/L (ref 20–31)
CREATININE: 0.7 mg/dL (ref 0.60–0.88)
Calcium: 10.2 mg/dL (ref 8.6–10.4)
Glucose, Bld: 69 mg/dL (ref 65–99)
Potassium: 4.3 mmol/L (ref 3.5–5.3)
Sodium: 139 mmol/L (ref 135–146)

## 2016-07-14 ENCOUNTER — Other Ambulatory Visit: Payer: Self-pay | Admitting: Cardiology

## 2016-10-11 ENCOUNTER — Other Ambulatory Visit: Payer: Self-pay | Admitting: Cardiology

## 2016-10-12 NOTE — Telephone Encounter (Signed)
Rx request sent to pharmacy.  

## 2017-01-20 ENCOUNTER — Telehealth: Payer: Self-pay

## 2017-01-20 NOTE — Telephone Encounter (Signed)
Patient called back in to reschedule her appt per dr Alvy Bimler.    She was not able to come in on 7/19 and went to next best day for the patient  Cindy Mccormick

## 2017-01-20 NOTE — Telephone Encounter (Signed)
Per inbox messaage dr Alvy Bimler request that the patient come in on Thursday 7/19 instead of Friday.  I have left a message with this information and have asked that the patient call me back.

## 2017-01-20 NOTE — Telephone Encounter (Signed)
"  May I speak with Webb Silversmith.  She called me about an appointment.  I am not available Thursday 01-21-2017."  Transferred ext [phone].  "I will leave her a message."

## 2017-01-22 ENCOUNTER — Other Ambulatory Visit: Payer: Medicare Other

## 2017-01-22 ENCOUNTER — Ambulatory Visit: Payer: Medicare Other | Admitting: Hematology and Oncology

## 2017-02-22 ENCOUNTER — Telehealth: Payer: Self-pay

## 2017-02-22 ENCOUNTER — Other Ambulatory Visit: Payer: Self-pay | Admitting: Hematology and Oncology

## 2017-02-22 ENCOUNTER — Telehealth: Payer: Self-pay | Admitting: *Deleted

## 2017-02-22 DIAGNOSIS — R300 Dysuria: Secondary | ICD-10-CM | POA: Insufficient documentation

## 2017-02-22 NOTE — Telephone Encounter (Signed)
I have updated the orders Please send scheduling msg to schedule labs before I see her and make sure she is reminded to check in earlier

## 2017-02-22 NOTE — Telephone Encounter (Signed)
Pt notified to come at 1100 for labs.  Msg to scheduler

## 2017-02-22 NOTE — Telephone Encounter (Signed)
Pt asked to add UA to her labs tomorrow. It is a little burning/ discomfort. There is some urgency. She is drinking plenty of fluids. No fever. She sees Dr Alvy Bimler tomorrow, but no lab appt set up at present.

## 2017-02-23 ENCOUNTER — Ambulatory Visit (HOSPITAL_BASED_OUTPATIENT_CLINIC_OR_DEPARTMENT_OTHER): Payer: Medicare Other | Admitting: Hematology and Oncology

## 2017-02-23 ENCOUNTER — Ambulatory Visit (HOSPITAL_BASED_OUTPATIENT_CLINIC_OR_DEPARTMENT_OTHER): Payer: Medicare Other

## 2017-02-23 ENCOUNTER — Telehealth: Payer: Self-pay | Admitting: Hematology and Oncology

## 2017-02-23 DIAGNOSIS — R300 Dysuria: Secondary | ICD-10-CM

## 2017-02-23 DIAGNOSIS — I10 Essential (primary) hypertension: Secondary | ICD-10-CM | POA: Diagnosis not present

## 2017-02-23 DIAGNOSIS — Z8572 Personal history of non-Hodgkin lymphomas: Secondary | ICD-10-CM | POA: Diagnosis not present

## 2017-02-23 LAB — CBC WITH DIFFERENTIAL/PLATELET
BASO%: 1.1 % (ref 0.0–2.0)
BASOS ABS: 0.1 10*3/uL (ref 0.0–0.1)
EOS ABS: 0.2 10*3/uL (ref 0.0–0.5)
EOS%: 2.6 % (ref 0.0–7.0)
HCT: 38.8 % (ref 34.8–46.6)
HEMOGLOBIN: 12.3 g/dL (ref 11.6–15.9)
LYMPH%: 29.8 % (ref 14.0–49.7)
MCH: 31 pg (ref 25.1–34.0)
MCHC: 31.7 g/dL (ref 31.5–36.0)
MCV: 97.7 fL (ref 79.5–101.0)
MONO#: 0.4 10*3/uL (ref 0.1–0.9)
MONO%: 6.2 % (ref 0.0–14.0)
NEUT#: 4.2 10*3/uL (ref 1.5–6.5)
NEUT%: 60.3 % (ref 38.4–76.8)
Platelets: 333 10*3/uL (ref 145–400)
RBC: 3.97 10*6/uL (ref 3.70–5.45)
RDW: 14.1 % (ref 11.2–14.5)
WBC: 7 10*3/uL (ref 3.9–10.3)
lymph#: 2.1 10*3/uL (ref 0.9–3.3)

## 2017-02-23 LAB — COMPREHENSIVE METABOLIC PANEL
ALBUMIN: 3.6 g/dL (ref 3.5–5.0)
ALT: 11 U/L (ref 0–55)
AST: 18 U/L (ref 5–34)
Alkaline Phosphatase: 82 U/L (ref 40–150)
Anion Gap: 7 mEq/L (ref 3–11)
BILIRUBIN TOTAL: 0.47 mg/dL (ref 0.20–1.20)
BUN: 19.2 mg/dL (ref 7.0–26.0)
CO2: 29 mEq/L (ref 22–29)
Calcium: 10.9 mg/dL — ABNORMAL HIGH (ref 8.4–10.4)
Chloride: 104 mEq/L (ref 98–109)
Creatinine: 0.8 mg/dL (ref 0.6–1.1)
EGFR: 64 mL/min/{1.73_m2} — AB (ref >90)
GLUCOSE: 85 mg/dL (ref 70–140)
POTASSIUM: 4 meq/L (ref 3.5–5.1)
SODIUM: 139 meq/L (ref 136–145)
Total Protein: 6.6 g/dL (ref 6.4–8.3)

## 2017-02-23 LAB — URINALYSIS, MICROSCOPIC - CHCC
BLOOD: NEGATIVE
Bilirubin (Urine): NEGATIVE
GLUCOSE UR CHCC: NEGATIVE mg/dL
KETONES: NEGATIVE mg/dL
Nitrite: NEGATIVE
Protein: <30 mg/dL
RBC / HPF: NEGATIVE (ref 0–2)
SPECIFIC GRAVITY, URINE: 1.005 (ref 1.003–1.035)
UROBILINOGEN UR: 0.2 mg/dL (ref 0.2–1)
pH: 6 (ref 4.6–8.0)

## 2017-02-23 LAB — LACTATE DEHYDROGENASE: LDH: 154 U/L (ref 125–245)

## 2017-02-23 NOTE — Telephone Encounter (Signed)
Scheduled apt per 8/21 los - Gave patient AVS and calender per los.

## 2017-02-24 ENCOUNTER — Encounter: Payer: Self-pay | Admitting: Hematology and Oncology

## 2017-02-24 NOTE — Assessment & Plan Note (Signed)
Clinical examination is benign She had no signs of disease recurrence I will continue history, physical examination, blood work once a year.

## 2017-02-24 NOTE — Progress Notes (Signed)
[facility] OFFICE PROGRESS NOTE  Patient Care Team: Josetta Huddle, MD as PCP - General (Internal Medicine) Heath Lark, MD as Consulting Physician (Hematology and Oncology)  SUMMARY OF ONCOLOGIC HISTORY: Oncology History   LYMPHOMA, lymphoplasmacytic    Primary site: Lymphoid Neoplasms   Staging method: AJCC 6th Edition   Clinical: Stage III signed by Heath Lark, MD on 11/28/2013  9:06 PM   Summary: Stage III A Initial IPI score of 2 Diagnosed elsewhere, CD 20 positive     History of non-Hodgkin's lymphoma   05/31/1995 Imaging    Date approximate. Ct imaging for hematuria showed large retroperitoneal mass with left ureter involvment      06/01/1995 Surgery    She had laparotomy and bilateral salpingo-oophorectomy and biopsy to be proven to be lymphoma.      06/07/1995 - 11/04/1995 Chemotherapy    She completed 6 cycles of CHOP plus etoposide chemotherapy      01/04/2004 Imaging    Imaging showed recurrence, confirmed on PET/CT scan.      01/07/2004 Procedure    Repeat biopsy confirmed CD20 positive lymphoma      01/14/2004 - 03/08/2004 Chemotherapy    She received single agent rituximab with no benefit.      03/27/2004 - 09/16/2004 Chemotherapy    She received 6 cycles of chemotherapy with RCVP with stable disease      01/20/2012 Imaging    Repeat CT scan showed new onset of left hydronephrosis and recurrence of soft tissue mass      02/08/2012 Imaging    That CT scan confirmed abnormalities      02/17/2012 Procedure    Repeat biopsy confirmed recurrent lymphoplasmacytic lymphoma      03/04/2012 - 07/14/2012 Chemotherapy    She received 6 more cycles of RCVP treatment      07/31/2012 Imaging    Repeat PET scan showed improved disease control      11/03/2012 Imaging    CT scan showed stable soft tissue mass with no recurrence      09/08/2013 Imaging    Repeat CT scan showed no evidence of recurrence      11/07/2013 - 11/17/2013 Hospital Admission    She  was admitted to the hospital with anemia and thrombocytopenia related to UTI, resolved. She also has acute on chronic renal failure      11/12/2013 Imaging    CT scan show no evidence of disease recurrence       INTERVAL HISTORY: Please see below for problem oriented charting. She returns for further follow-up She feels well She denies recent new lymphadenopathy No chest pain or shortness of breath She complain of abnormal dysuria Appetite is stable, no recent weight loss. REVIEW OF SYSTEMS:   Constitutional: Denies fevers, chills or abnormal weight loss Eyes: Denies blurriness of vision Ears, nose, mouth, throat, and face: Denies mucositis or sore throat Respiratory: Denies cough, dyspnea or wheezes Cardiovascular: Denies palpitation, chest discomfort or lower extremity swelling Gastrointestinal:  Denies nausea, heartburn or change in bowel habits Skin: Denies abnormal skin rashes Lymphatics: Denies new lymphadenopathy or easy bruising Neurological:Denies numbness, tingling or new weaknesses Behavioral/Psych: Mood is stable, no new changes  All other systems were reviewed with the patient and are negative.  I have reviewed the past medical history, past surgical history, social history and family history with the patient and they are unchanged from previous note.  ALLERGIES:  is allergic to tramadol; ezetimibe; hydrocodone-acetaminophen; lisinopril; nitrofurantoin; other; oxycodone hcl; sulfonamide derivatives;  and torsemide.  MEDICATIONS:  Current Outpatient Prescriptions  Medication Sig Dispense Refill  . aspirin EC 81 MG tablet Take 1 tablet (81 mg total) by mouth daily. 90 tablet 3  . carvedilol (COREG) 3.125 MG tablet TAKE 1 TABLET(3.125 MG) BY MOUTH TWICE DAILY WITH A MEAL 180 tablet 2  . levothyroxine (SYNTHROID, LEVOTHROID) 25 MCG tablet TK 1 T PO QD  3  . losartan (COZAAR) 25 MG tablet   0  . Multiple Vitamin (MULTIVITAMIN WITH MINERALS) TABS tablet Take 1 tablet by  mouth daily.    . Multiple Vitamins-Minerals (PRESERVISION/LUTEIN PO) Take by mouth.    . sennosides-docusate sodium (SENOKOT-S) 8.6-50 MG tablet Take 1 tablet by mouth 2 (two) times daily.    Marland Kitchen spironolactone (ALDACTONE) 50 MG tablet Take 50 mg by mouth daily.     No current facility-administered medications for this visit.     PHYSICAL EXAMINATION: ECOG PERFORMANCE STATUS: 1 - Symptomatic but completely ambulatory  Vitals:   02/23/17 1129  BP: (!) 155/71  Pulse: 68  Resp: 20  Temp: 98.2 F (36.8 C)  SpO2: 100%   Filed Weights   02/23/17 1129  Weight: 114 lb 1.6 oz (51.8 kg)    GENERAL:alert, no distress and comfortable SKIN: skin color, texture, turgor are normal, no rashes or significant lesions EYES: normal, Conjunctiva are pink and non-injected, sclera clear OROPHARYNX:no exudate, no erythema and lips, buccal mucosa, and tongue normal  NECK: supple, thyroid normal size, non-tender, without nodularity LYMPH:  no palpable lymphadenopathy in the cervical, axillary or inguinal LUNGS: clear to auscultation and percussion with normal breathing effort HEART: regular rate & rhythm and no murmurs and no lower extremity edema ABDOMEN:abdomen soft, non-tender and normal bowel sounds Musculoskeletal:no cyanosis of digits and no clubbing  NEURO: alert & oriented x 3 with fluent speech, no focal motor/sensory deficits  LABORATORY DATA:  I have reviewed the data as listed    Component Value Date/Time   NA 139 02/23/2017 1114   K 4.0 02/23/2017 1114   CL 104 05/20/2016 1151   CO2 29 02/23/2017 1114   GLUCOSE 85 02/23/2017 1114   BUN 19.2 02/23/2017 1114   CREATININE 0.8 02/23/2017 1114   CALCIUM 10.9 (H) 02/23/2017 1114   PROT 6.6 02/23/2017 1114   ALBUMIN 3.6 02/23/2017 1114   AST 18 02/23/2017 1114   ALT 11 02/23/2017 1114   ALKPHOS 82 02/23/2017 1114   BILITOT 0.47 02/23/2017 1114   GFRNONAA 70 04/30/2014 1103   GFRAA 81 04/30/2014 1103    No results found for:  SPEP, UPEP  Lab Results  Component Value Date   WBC 7.0 02/23/2017   NEUTROABS 4.2 02/23/2017   HGB 12.3 02/23/2017   HCT 38.8 02/23/2017   MCV 97.7 02/23/2017   PLT 333 02/23/2017      Chemistry      Component Value Date/Time   NA 139 02/23/2017 1114   K 4.0 02/23/2017 1114   CL 104 05/20/2016 1151   CO2 29 02/23/2017 1114   BUN 19.2 02/23/2017 1114   CREATININE 0.8 02/23/2017 1114      Component Value Date/Time   CALCIUM 10.9 (H) 02/23/2017 1114   ALKPHOS 82 02/23/2017 1114   AST 18 02/23/2017 1114   ALT 11 02/23/2017 1114   BILITOT 0.47 02/23/2017 1114      ASSESSMENT & PLAN:  History of non-Hodgkin's lymphoma Clinical examination is benign She had no signs of disease recurrence I will continue history, physical examination, blood work once  a year.  Dysuria She had mild dysuria I will order urinalysis and urine culture and will contact the patient with test results  Essential hypertension The blood pressure is slightly high. It could be due to whitecoat hypertension She will continue medical management I recommend close follow-up with PCP     No orders of the defined types were placed in this encounter.  All questions were answered. The patient knows to call the clinic with any problems, questions or concerns. No barriers to learning was detected. I spent 15 minutes counseling the patient face to face. The total time spent in the appointment was 20 minutes and more than 50% was on counseling and review of test results     Heath Lark, MD 02/24/2017 11:48 AM

## 2017-02-24 NOTE — Assessment & Plan Note (Signed)
She had mild dysuria I will order urinalysis and urine culture and will contact the patient with test results

## 2017-02-24 NOTE — Assessment & Plan Note (Signed)
The blood pressure is slightly high. It could be due to whitecoat hypertension She will continue medical management I recommend close follow-up with PCP 

## 2017-02-25 ENCOUNTER — Telehealth: Payer: Self-pay | Admitting: *Deleted

## 2017-02-25 LAB — URINE CULTURE

## 2017-02-25 MED ORDER — CIPROFLOXACIN HCL 250 MG PO TABS: ORAL_TABLET | ORAL | 0 refills | Status: DC

## 2017-02-25 NOTE — Telephone Encounter (Signed)
Pt notified of message below.

## 2017-02-25 NOTE — Telephone Encounter (Signed)
-----   Message from Heath Lark, MD sent at 02/25/2017  8:49 AM EDT ----- Regarding: UTI pls tell her urine culture is positive Call in cipro 250 mg BID PO X 3 days, no refills ----- Message ----- From: Interface, Lab In Three Zero One Sent: 02/23/2017  11:26 AM To: Heath Lark, MD

## 2017-04-08 ENCOUNTER — Other Ambulatory Visit: Payer: Self-pay | Admitting: Cardiology

## 2017-04-08 DIAGNOSIS — I428 Other cardiomyopathies: Secondary | ICD-10-CM

## 2017-06-17 ENCOUNTER — Ambulatory Visit: Payer: Medicare Other | Admitting: Cardiology

## 2017-07-07 ENCOUNTER — Other Ambulatory Visit: Payer: Self-pay | Admitting: Cardiology

## 2017-07-07 DIAGNOSIS — I428 Other cardiomyopathies: Secondary | ICD-10-CM

## 2017-07-24 ENCOUNTER — Other Ambulatory Visit: Payer: Self-pay | Admitting: Cardiology

## 2017-08-03 NOTE — Progress Notes (Signed)
HPI:FU nonischemic cardiomyopathy improved on fu echocardiogram. Echo in Oct of 2012 showed EF 25 to 30, mild LAE and trace AI and MR. Myoview was performed in November of 2012 and showed an ejection fraction of 45%. There was a small partially reversible apical defect suggestive of thinning versus mild ischemia. TSH was normal. Last echocardiogram in Oct 2015 showed EF 95-18, grade 1 diastolic dysfunction. Since I last saw her the patient denies any dyspnea on exertion, orthopnea, PND, pedal edema, palpitations, syncope or chest pain.   Current Outpatient Medications  Medication Sig Dispense Refill  . aspirin EC 81 MG tablet Take 1 tablet (81 mg total) by mouth daily. 90 tablet 3  . carvedilol (COREG) 3.125 MG tablet TAKE 1 TABLET(3.125 MG) BY MOUTH TWICE DAILY WITH A MEAL 180 tablet 0  . levothyroxine (SYNTHROID, LEVOTHROID) 25 MCG tablet TK 1 T PO QD  3  . losartan (COZAAR) 25 MG tablet TAKE 1 TABLET(25 MG) BY MOUTH DAILY 90 tablet 0  . Multiple Vitamin (MULTIVITAMIN WITH MINERALS) TABS tablet Take 1 tablet by mouth daily.    . Multiple Vitamins-Minerals (PRESERVISION/LUTEIN PO) Take by mouth.    . sennosides-docusate sodium (SENOKOT-S) 8.6-50 MG tablet Take 1 tablet by mouth 2 (two) times daily.    Marland Kitchen spironolactone (ALDACTONE) 50 MG tablet Take 50 mg by mouth daily.     No current facility-administered medications for this visit.      Past Medical History:  Diagnosis Date  . Cancer [facility])    recurrent lymphoma  . Cancer [facility])    carcinoid cancer per outside record  . CARDIOMYOPATHY   . DIVERTICULITIS, COLON   . H/O: hysterectomy 1977  . Hemorrhoids, internal   . HYPERLIPIDEMIA   . HYPERTENSION   . LEFT BUNDLE BRANCH BLOCK   . LYMPHOMA   . Maintenance chemotherapy   . Skin cancer    melanoma diagnosed elsewhere 1999    Past Surgical History:  Procedure Laterality Date  . APPENDECTOMY  1949  . COLONOSCOPY WITH PROPOFOL N/A 11/16/2013   Procedure: COLONOSCOPY WITH  PROPOFOL;  Surgeon: [name], MD;  Location: [place];  Service: Endoscopy;  Laterality: N/A;  . Lymphoma biopsies    . TOE SURGERY      Social History   Socioeconomic History  . Marital status: Widowed    Spouse name: Not on file  . Number of children: Not on file  . Years of education: Not on file  . Highest education level: Not on file  Social Needs  . Financial resource strain: Not on file  . Food insecurity - worry: Not on file  . Food insecurity - inability: Not on file  . Transportation needs - medical: Not on file  . Transportation needs - non-medical: Not on file  Occupational History  . Occupation: retired    Fish farm manager: OTHER  Tobacco Use  . Smoking status: Former Research scientist (life sciences)  . Smokeless tobacco: Never Used  Substance and Sexual Activity  . Alcohol use: No  . Drug use: No  . Sexual activity: Not on file  Other Topics Concern  . Not on file  Social History Narrative  . Not on file    Family History  Problem Relation Age of Onset  . Lymphoma Mother   . Sudden death Father   . Hypertension Neg Hx   . Hyperlipidemia Neg Hx   . Heart attack Neg Hx   . Diabetes Neg Hx     ROS: no  fevers or chills, productive cough, hemoptysis, dysphasia, odynophagia, melena, hematochezia, dysuria, hematuria, rash, seizure activity, orthopnea, PND, pedal edema, claudication. Remaining systems are negative.  Physical Exam: Well-developed well-nourished in no acute distress.  Skin is warm and dry.  HEENT is normal.  Neck is supple.  Chest is clear to auscultation with normal expansion.  Cardiovascular exam is regular rate and rhythm.  Abdominal exam nontender or distended. No masses palpated. Extremities show no edema. neuro grossly intact  ECG-sinus rhythm at a rate of 63.  Incomplete left bundle branch block.  Personally reviewed  A/P  1 cardiomyopathy-most recent echo showed some improvement in LV function.  Continue ARB, beta-blocker and Spironolactone.  Check  potassium and renal function. Repeat echo.  2 hypertension-blood pressure is controlled.  Continue present medications.  3 hyperlipidemia-managed by primary care.  Kirk Ruths, MD

## 2017-08-12 ENCOUNTER — Encounter: Payer: Self-pay | Admitting: Cardiology

## 2017-08-12 ENCOUNTER — Ambulatory Visit: Payer: Medicare Other | Admitting: Cardiology

## 2017-08-12 VITALS — BP 138/74 | HR 63 | Ht 60.0 in | Wt 111.6 lb

## 2017-08-12 DIAGNOSIS — I428 Other cardiomyopathies: Secondary | ICD-10-CM

## 2017-08-12 DIAGNOSIS — I10 Essential (primary) hypertension: Secondary | ICD-10-CM | POA: Diagnosis not present

## 2017-08-12 DIAGNOSIS — E7800 Pure hypercholesterolemia, unspecified: Secondary | ICD-10-CM

## 2017-08-12 NOTE — Patient Instructions (Signed)
Medication Instructions:   NO CHANGE  Labwork:  Your physician recommends that you HAVE LAB WORK TODAY  Testing/Procedures:  Your physician has requested that you have an echocardiogram. Echocardiography is a painless test that uses sound waves to create images of your heart. It provides your doctor with information about the size and shape of your heart and how well your heart's chambers and valves are working. This procedure takes approximately one hour. There are no restrictions for this procedure.AT THE [place] [facility]    Follow-Up:  Your physician wants you to follow-up in: [place] will receive a reminder letter in the mail two months in advance. If you don't receive a letter, please call our office to schedule the follow-up appointment.   If you need a refill on your cardiac medications before your next appointment, please call your pharmacy.

## 2017-08-13 ENCOUNTER — Encounter: Payer: Self-pay | Admitting: *Deleted

## 2017-08-13 LAB — BASIC METABOLIC PANEL
BUN/Creatinine Ratio: 19 (ref 12–28)
BUN: 14 mg/dL (ref 8–27)
CO2: 25 mmol/L (ref 20–29)
CREATININE: 0.72 mg/dL (ref 0.57–1.00)
Calcium: 10.1 mg/dL (ref 8.7–10.3)
Chloride: 102 mmol/L (ref 96–106)
GFR, EST AFRICAN AMERICAN: 87 mL/min/{1.73_m2} (ref >59)
GFR, EST NON AFRICAN AMERICAN: 76 mL/min/{1.73_m2} (ref >59)
GLUCOSE: 74 mg/dL (ref 65–99)
Potassium: 4.4 mmol/L (ref 3.5–5.2)
SODIUM: 142 mmol/L (ref 134–144)

## 2017-09-03 ENCOUNTER — Ambulatory Visit (HOSPITAL_BASED_OUTPATIENT_CLINIC_OR_DEPARTMENT_OTHER)
Admission: RE | Admit: 2017-09-03 | Discharge: 2017-09-03 | Disposition: A | Discharge status: Home / Self Care | Payer: Medicare Other | Source: Ambulatory Visit | Attending: Cardiology | Admitting: Cardiology

## 2017-09-03 DIAGNOSIS — I428 Other cardiomyopathies: Secondary | ICD-10-CM | POA: Diagnosis not present

## 2017-09-03 DIAGNOSIS — I119 Hypertensive heart disease without heart failure: Secondary | ICD-10-CM | POA: Diagnosis not present

## 2017-09-03 DIAGNOSIS — I080 Rheumatic disorders of both mitral and aortic valves: Secondary | ICD-10-CM | POA: Insufficient documentation

## 2017-09-03 DIAGNOSIS — I700 Atherosclerosis of aorta: Secondary | ICD-10-CM | POA: Diagnosis not present

## 2017-09-03 DIAGNOSIS — E785 Hyperlipidemia, unspecified: Secondary | ICD-10-CM | POA: Insufficient documentation

## 2017-09-03 DIAGNOSIS — I447 Left bundle-branch block, unspecified: Secondary | ICD-10-CM | POA: Insufficient documentation

## 2017-09-03 NOTE — Progress Notes (Signed)
Echocardiogram 2D Echocardiogram has been performed.  [facility] 09/03/2017, 12:10 PM

## 2017-09-14 ENCOUNTER — Telehealth: Payer: Self-pay

## 2017-09-14 NOTE — Telephone Encounter (Signed)
Pt mailed in BP readings:   2/15 122/69 2/16 122/71 2/17 119/68 2/18 124/70 HR 73  105/63 HR 70 2/19 105/58 2/20 109/68 2/21 108/59 HR 72 2/22 107/63 HR 69

## 2017-09-14 NOTE — Telephone Encounter (Signed)
BP excellent Kirk Ruths

## 2018-02-21 ENCOUNTER — Other Ambulatory Visit: Payer: Self-pay | Admitting: Hematology and Oncology

## 2018-02-21 DIAGNOSIS — Z8572 Personal history of non-Hodgkin lymphomas: Secondary | ICD-10-CM

## 2018-02-22 ENCOUNTER — Inpatient Hospital Stay: Payer: Medicare Other

## 2018-02-22 ENCOUNTER — Telehealth: Payer: Self-pay | Admitting: Hematology and Oncology

## 2018-02-22 ENCOUNTER — Encounter: Payer: Self-pay | Admitting: Hematology and Oncology

## 2018-02-22 ENCOUNTER — Inpatient Hospital Stay: Payer: Medicare Other | Attending: Hematology and Oncology | Admitting: Hematology and Oncology

## 2018-02-22 DIAGNOSIS — Z79899 Other long term (current) drug therapy: Secondary | ICD-10-CM | POA: Diagnosis not present

## 2018-02-22 DIAGNOSIS — I10 Essential (primary) hypertension: Secondary | ICD-10-CM

## 2018-02-22 DIAGNOSIS — Z8572 Personal history of non-Hodgkin lymphomas: Secondary | ICD-10-CM | POA: Diagnosis not present

## 2018-02-22 DIAGNOSIS — Z9221 Personal history of antineoplastic chemotherapy: Secondary | ICD-10-CM | POA: Insufficient documentation

## 2018-02-22 DIAGNOSIS — I129 Hypertensive chronic kidney disease with stage 1 through stage 4 chronic kidney disease, or unspecified chronic kidney disease: Secondary | ICD-10-CM | POA: Diagnosis not present

## 2018-02-22 LAB — COMPREHENSIVE METABOLIC PANEL
ALBUMIN: 3.5 g/dL (ref 3.5–5.0)
ALK PHOS: 74 U/L (ref 38–126)
ALT: 13 U/L (ref 0–44)
ANION GAP: 7 (ref 5–15)
AST: 18 U/L (ref 15–41)
BILIRUBIN TOTAL: 0.4 mg/dL (ref 0.3–1.2)
BUN: 15 mg/dL (ref 8–23)
CALCIUM: 10.2 mg/dL (ref 8.9–10.3)
CO2: 28 mmol/L (ref 22–32)
Chloride: 104 mmol/L (ref 98–111)
Creatinine, Ser: 0.76 mg/dL (ref 0.44–1.00)
GFR calc Af Amer: >60 mL/min (ref >60)
GLUCOSE: 90 mg/dL (ref 70–99)
Potassium: 4 mmol/L (ref 3.5–5.1)
Sodium: 139 mmol/L (ref 135–145)
TOTAL PROTEIN: 6.5 g/dL (ref 6.5–8.1)

## 2018-02-22 LAB — CBC WITH DIFFERENTIAL/PLATELET
BASOS PCT: 1 %
Basophils Absolute: 0.1 10*3/uL (ref 0.0–0.1)
Eosinophils Absolute: 0.1 10*3/uL (ref 0.0–0.5)
Eosinophils Relative: 2 %
HEMATOCRIT: 36.2 % (ref 34.8–46.6)
Hemoglobin: 12.1 g/dL (ref 11.6–15.9)
LYMPHS PCT: 29 %
Lymphs Abs: 1.8 10*3/uL (ref 0.9–3.3)
MCH: 31.8 pg (ref 25.1–34.0)
MCHC: 33.6 g/dL (ref 31.5–36.0)
MCV: 94.7 fL (ref 79.5–101.0)
MONO ABS: 0.5 10*3/uL (ref 0.1–0.9)
MONOS PCT: 8 %
NEUTROS ABS: 3.6 10*3/uL (ref 1.5–6.5)
NEUTROS PCT: 60 %
Platelets: 337 10*3/uL (ref 145–400)
RBC: 3.82 MIL/uL (ref 3.70–5.45)
RDW: 14.6 % — AB (ref 11.2–14.5)
WBC: 6.1 10*3/uL (ref 3.9–10.3)

## 2018-02-22 LAB — LACTATE DEHYDROGENASE: LDH: 136 U/L (ref 98–192)

## 2018-02-22 NOTE — Assessment & Plan Note (Signed)
The blood pressure is slightly high. It could be due to whitecoat hypertension She will continue medical management I recommend close follow-up with PCP

## 2018-02-22 NOTE — Telephone Encounter (Signed)
Gave avs and calendar ° °

## 2018-02-22 NOTE — Assessment & Plan Note (Addendum)
Clinical examination is benign She had no signs of disease recurrence The patient wants to return here on a yearly basis despite being a long-term cancer survivor I will continue history, physical examination, blood work once a year. We discussed the importance of annual influenza vaccination

## 2018-02-22 NOTE — Progress Notes (Signed)
[facility] OFFICE PROGRESS NOTE  Patient Care Team: Josetta Huddle, MD as PCP - General (Internal Medicine) Heath Lark, MD as Consulting Physician (Hematology and Oncology)  ASSESSMENT & PLAN:  History of non-Hodgkin's lymphoma Clinical examination is benign She had no signs of disease recurrence The patient wants to return here on a yearly basis despite being a long-term cancer survivor I will continue history, physical examination, blood work once a year. We discussed the importance of annual influenza vaccination  Essential hypertension The blood pressure is slightly high. It could be due to whitecoat hypertension She will continue medical management I recommend close follow-up with PCP   No orders of the defined types were placed in this encounter.   INTERVAL HISTORY: Please see below for problem oriented charting. She returns for lymphoma follow-up Denies recent infection, fever or chills No new lymphadenopathy Denies recent signs or symptoms of congestive heart failure such as cough, chest pain or shortness of breath  SUMMARY OF ONCOLOGIC HISTORY: Oncology History   LYMPHOMA, lymphoplasmacytic    Primary site: Lymphoid Neoplasms   Staging method: AJCC 6th Edition   Clinical: Stage III signed by Heath Lark, MD on 11/28/2013  9:06 PM   Summary: Stage III A Initial IPI score of 2 Diagnosed elsewhere, CD 20 positive     History of non-Hodgkin's lymphoma   05/31/1995 Imaging    Date approximate. Ct imaging for hematuria showed large retroperitoneal mass with left ureter involvment    06/01/1995 Surgery    She had laparotomy and bilateral salpingo-oophorectomy and biopsy to be proven to be lymphoma.    06/07/1995 - 11/04/1995 Chemotherapy    She completed 6 cycles of CHOP plus etoposide chemotherapy    01/04/2004 Imaging    Imaging showed recurrence, confirmed on PET/CT scan.    01/07/2004 Procedure    Repeat biopsy confirmed CD20 positive lymphoma     01/14/2004 - 03/08/2004 Chemotherapy    She received single agent rituximab with no benefit.    03/27/2004 - 09/16/2004 Chemotherapy    She received 6 cycles of chemotherapy with RCVP with stable disease    01/20/2012 Imaging    Repeat CT scan showed new onset of left hydronephrosis and recurrence of soft tissue mass    02/08/2012 Imaging    That CT scan confirmed abnormalities    02/17/2012 Procedure    Repeat biopsy confirmed recurrent lymphoplasmacytic lymphoma    03/04/2012 - 07/14/2012 Chemotherapy    She received 6 more cycles of RCVP treatment    07/31/2012 Imaging    Repeat PET scan showed improved disease control    11/03/2012 Imaging    CT scan showed stable soft tissue mass with no recurrence    09/08/2013 Imaging    Repeat CT scan showed no evidence of recurrence    11/07/2013 - 11/17/2013 Hospital Admission    She was admitted to the hospital with anemia and thrombocytopenia related to UTI, resolved. She also has acute on chronic renal failure    11/12/2013 Imaging    CT scan show no evidence of disease recurrence     REVIEW OF SYSTEMS:   Constitutional: Denies fevers, chills or abnormal weight loss Eyes: Denies blurriness of vision Ears, nose, mouth, throat, and face: Denies mucositis or sore throat Respiratory: Denies cough, dyspnea or wheezes Cardiovascular: Denies palpitation, chest discomfort or lower extremity swelling Gastrointestinal:  Denies nausea, heartburn or change in bowel habits Skin: Denies abnormal skin rashes Lymphatics: Denies new lymphadenopathy or easy bruising Neurological:Denies numbness,  tingling or new weaknesses Behavioral/Psych: Mood is stable, no new changes  All other systems were reviewed with the patient and are negative.  I have reviewed the past medical history, past surgical history, social history and family history with the patient and they are unchanged from previous note.  ALLERGIES:  is allergic to tramadol; ezetimibe;  hydrocodone-acetaminophen; lisinopril; nitrofurantoin; other; oxycodone hcl; sulfonamide derivatives; and torsemide.  MEDICATIONS:  Current Outpatient Medications  Medication Sig Dispense Refill  . aspirin EC 81 MG tablet Take 1 tablet (81 mg total) by mouth daily. 90 tablet 3  . carvedilol (COREG) 3.125 MG tablet TAKE 1 TABLET(3.125 MG) BY MOUTH TWICE DAILY WITH A MEAL 180 tablet 0  . levothyroxine (SYNTHROID, LEVOTHROID) 25 MCG tablet TK 1 T PO QD  3  . losartan (COZAAR) 25 MG tablet TAKE 1 TABLET(25 MG) BY MOUTH DAILY 90 tablet 0  . Multiple Vitamin (MULTIVITAMIN WITH MINERALS) TABS tablet Take 1 tablet by mouth daily.    . Multiple Vitamins-Minerals (PRESERVISION/LUTEIN PO) Take by mouth.    . sennosides-docusate sodium (SENOKOT-S) 8.6-50 MG tablet Take 1 tablet by mouth 2 (two) times daily.    Marland Kitchen spironolactone (ALDACTONE) 50 MG tablet Take 50 mg by mouth daily.     No current facility-administered medications for this visit.     PHYSICAL EXAMINATION: ECOG PERFORMANCE STATUS: 0 - Asymptomatic  Vitals:   02/22/18 1203  BP: (!) 148/63  Pulse: 69  Resp: 18  Temp: 97.9 F (36.6 C)  SpO2: 100%   Filed Weights   02/22/18 1203  Weight: 110 lb (49.9 kg)    GENERAL:alert, no distress and comfortable SKIN: skin color, texture, turgor are normal, no rashes or significant lesions EYES: normal, Conjunctiva are pink and non-injected, sclera clear OROPHARYNX:no exudate, no erythema and lips, buccal mucosa, and tongue normal  NECK: supple, thyroid normal size, non-tender, without nodularity LYMPH:  no palpable lymphadenopathy in the cervical, axillary or inguinal LUNGS: clear to auscultation and percussion with normal breathing effort HEART: regular rate & rhythm and no murmurs and no lower extremity edema ABDOMEN:abdomen soft, non-tender and normal bowel sounds Musculoskeletal:no cyanosis of digits and no clubbing  NEURO: alert & oriented x 3 with fluent speech, no focal  motor/sensory deficits  LABORATORY DATA:  I have reviewed the data as listed    Component Value Date/Time   NA 139 02/22/2018 1141   NA 142 08/12/2017 1204   NA 139 02/23/2017 1114   K 4.0 02/22/2018 1141   K 4.0 02/23/2017 1114   CL 104 02/22/2018 1141   CO2 28 02/22/2018 1141   CO2 29 02/23/2017 1114   GLUCOSE 90 02/22/2018 1141   GLUCOSE 85 02/23/2017 1114   BUN 15 02/22/2018 1141   BUN 14 08/12/2017 1204   BUN 19.2 02/23/2017 1114   CREATININE 0.76 02/22/2018 1141   CREATININE 0.8 02/23/2017 1114   CALCIUM 10.2 02/22/2018 1141   CALCIUM 10.9 (H) 02/23/2017 1114   PROT 6.5 02/22/2018 1141   PROT 6.6 02/23/2017 1114   ALBUMIN 3.5 02/22/2018 1141   ALBUMIN 3.6 02/23/2017 1114   AST 18 02/22/2018 1141   AST 18 02/23/2017 1114   ALT 13 02/22/2018 1141   ALT 11 02/23/2017 1114   ALKPHOS 74 02/22/2018 1141   ALKPHOS 82 02/23/2017 1114   BILITOT 0.4 02/22/2018 1141   BILITOT 0.47 02/23/2017 1114   GFRNONAA >60 02/22/2018 1141   GFRNONAA 70 04/30/2014 1103   GFRAA >60 02/22/2018 1141   GFRAA 81 04/30/2014  1103    No results found for: SPEP, UPEP  Lab Results  Component Value Date   WBC 6.1 02/22/2018   NEUTROABS 3.6 02/22/2018   HGB 12.1 02/22/2018   HCT 36.2 02/22/2018   MCV 94.7 02/22/2018   PLT 337 02/22/2018      Chemistry      Component Value Date/Time   NA 139 02/22/2018 1141   NA 142 08/12/2017 1204   NA 139 02/23/2017 1114   K 4.0 02/22/2018 1141   K 4.0 02/23/2017 1114   CL 104 02/22/2018 1141   CO2 28 02/22/2018 1141   CO2 29 02/23/2017 1114   BUN 15 02/22/2018 1141   BUN 14 08/12/2017 1204   BUN 19.2 02/23/2017 1114   CREATININE 0.76 02/22/2018 1141   CREATININE 0.8 02/23/2017 1114      Component Value Date/Time   CALCIUM 10.2 02/22/2018 1141   CALCIUM 10.9 (H) 02/23/2017 1114   ALKPHOS 74 02/22/2018 1141   ALKPHOS 82 02/23/2017 1114   AST 18 02/22/2018 1141   AST 18 02/23/2017 1114   ALT 13 02/22/2018 1141   ALT 11 02/23/2017 1114    BILITOT 0.4 02/22/2018 1141   BILITOT 0.47 02/23/2017 1114       All questions were answered. The patient knows to call the clinic with any problems, questions or concerns. No barriers to learning was detected.  I spent 10 minutes counseling the patient face to face. The total time spent in the appointment was 15 minutes and more than 50% was on counseling and review of test results  Heath Lark, MD 02/22/2018 12:52 PM

## 2018-07-22 NOTE — Progress Notes (Deleted)
HPI: FU nonischemic cardiomyopathy improved on fu echocardiogram. Echo in Oct of 2012 showed EF 25 to 30, mild LAE and trace AI and MR. Myoview was performed in November of 2012 and showed an ejection fraction of 45%. There was a small partially reversible apical defect suggestive of thinning versus mild ischemia. TSH was normal. Last echocardiogram March 2019 showed ejection fraction 30 to 35%, mild aortic insufficiency, moderate mitral regurgitation and mild left atrial enlargement.  Since I last saw her   Current Outpatient Medications  Medication Sig Dispense Refill  . aspirin EC 81 MG tablet Take 1 tablet (81 mg total) by mouth daily. 90 tablet 3  . carvedilol (COREG) 3.125 MG tablet TAKE 1 TABLET(3.125 MG) BY MOUTH TWICE DAILY WITH A MEAL 180 tablet 0  . levothyroxine (SYNTHROID, LEVOTHROID) 25 MCG tablet TK 1 T PO QD  3  . losartan (COZAAR) 25 MG tablet TAKE 1 TABLET(25 MG) BY MOUTH DAILY 90 tablet 0  . Multiple Vitamin (MULTIVITAMIN WITH MINERALS) TABS tablet Take 1 tablet by mouth daily.    . Multiple Vitamins-Minerals (PRESERVISION/LUTEIN PO) Take by mouth.    . sennosides-docusate sodium (SENOKOT-S) 8.6-50 MG tablet Take 1 tablet by mouth 2 (two) times daily.    Marland Kitchen spironolactone (ALDACTONE) 50 MG tablet Take 50 mg by mouth daily.     No current facility-administered medications for this visit.      Past Medical History:  Diagnosis Date  . Cancer [facility])    recurrent lymphoma  . Cancer [facility])    carcinoid cancer per outside record  . CARDIOMYOPATHY   . DIVERTICULITIS, COLON   . H/O: hysterectomy 1977  . Hemorrhoids, internal   . HYPERLIPIDEMIA   . HYPERTENSION   . LEFT BUNDLE BRANCH BLOCK   . LYMPHOMA   . Maintenance chemotherapy   . Skin cancer    melanoma diagnosed elsewhere 1999    Past Surgical History:  Procedure Laterality Date  . APPENDECTOMY  1949  . COLONOSCOPY WITH PROPOFOL N/A 11/16/2013   Procedure: COLONOSCOPY WITH PROPOFOL;  Surgeon: [name], MD;  Location: [place];  Service: Endoscopy;  Laterality: N/A;  . Lymphoma biopsies    . TOE SURGERY      Social History   Socioeconomic History  . Marital status: Widowed    Spouse name: Not on file  . Number of children: Not on file  . Years of education: Not on file  . Highest education level: Not on file  Occupational History  . Occupation: retired    Fish farm manager: OTHER  Social Needs  . Financial resource strain: Not on file  . Food insecurity:    Worry: Not on file    Inability: Not on file  . Transportation needs:    Medical: Not on file    Non-medical: Not on file  Tobacco Use  . Smoking status: Former Research scientist (life sciences)  . Smokeless tobacco: Never Used  Substance and Sexual Activity  . Alcohol use: No  . Drug use: No  . Sexual activity: Not on file  Lifestyle  . Physical activity:    Days per week: Not on file    Minutes per session: Not on file  . Stress: Not on file  Relationships  . Social connections:    Talks on phone: Not on file    Gets together: Not on file    Attends religious service: Not on file    Active member of club or organization: Not on file  Attends meetings of clubs or organizations: Not on file    Relationship status: Not on file  . Intimate partner violence:    Fear of current or ex partner: Not on file    Emotionally abused: Not on file    Physically abused: Not on file    Forced sexual activity: Not on file  Other Topics Concern  . Not on file  Social History Narrative  . Not on file    Family History  Problem Relation Age of Onset  . Lymphoma Mother   . Sudden death Father   . Hypertension Neg Hx   . Hyperlipidemia Neg Hx   . Heart attack Neg Hx   . Diabetes Neg Hx     ROS: no fevers or chills, productive cough, hemoptysis, dysphasia, odynophagia, melena, hematochezia, dysuria, hematuria, rash, seizure activity, orthopnea, PND, pedal edema, claudication. Remaining systems are negative.  Physical Exam: Well-developed  well-nourished in no acute distress.  Skin is warm and dry.  HEENT is normal.  Neck is supple.  Chest is clear to auscultation with normal expansion.  Cardiovascular exam is regular rate and rhythm.  Abdominal exam nontender or distended. No masses palpated. Extremities show no edema. neuro grossly intact  ECG- personally reviewed  A/P  1  Cindy Ruths, MD

## 2018-07-27 ENCOUNTER — Ambulatory Visit: Payer: Medicare Other | Admitting: Cardiology

## 2018-08-29 ENCOUNTER — Ambulatory Visit: Payer: Medicare Other | Admitting: Cardiology

## 2018-08-29 ENCOUNTER — Encounter: Payer: Self-pay | Admitting: Cardiology

## 2018-09-09 NOTE — Progress Notes (Signed)
HPI: FU nonischemic cardiomyopathy. Echo in Oct of 2012 showed EF 25 to 30, mild LAE and trace AI and MR. Myoview was performed in November of 2012 and showed an ejection fraction of 45%. There was a small partially reversible apical defect suggestive of thinning versus mild ischemia. TSH was normal.  Last echocardiogram March 2019 showed ejection fraction 30 to 35%, mild aortic insufficiency, moderate mitral regurgitation and mild left atrial enlargement.  Since I last saw her she denies dyspnea, chest pain, palpitations or syncope.  Current Outpatient Medications  Medication Sig Dispense Refill  . aspirin EC 81 MG tablet Take 1 tablet (81 mg total) by mouth daily. 90 tablet 3  . carvedilol (COREG) 3.125 MG tablet TAKE 1 TABLET(3.125 MG) BY MOUTH TWICE DAILY WITH A MEAL 180 tablet 0  . levothyroxine (SYNTHROID, LEVOTHROID) 25 MCG tablet TK 1 T PO QD  3  . losartan (COZAAR) 25 MG tablet TAKE 1 TABLET(25 MG) BY MOUTH DAILY 90 tablet 0  . Multiple Vitamin (MULTIVITAMIN WITH MINERALS) TABS tablet Take 1 tablet by mouth daily.    . Multiple Vitamins-Minerals (PRESERVISION/LUTEIN PO) Take by mouth.    . sennosides-docusate sodium (SENOKOT-S) 8.6-50 MG tablet Take 1 tablet by mouth 2 (two) times daily.    Marland Kitchen spironolactone (ALDACTONE) 50 MG tablet Take 50 mg by mouth daily.     No current facility-administered medications for this visit.      Past Medical History:  Diagnosis Date  . Cancer [facility])    recurrent lymphoma  . Cancer [facility])    carcinoid cancer per outside record  . CARDIOMYOPATHY   . DIVERTICULITIS, COLON   . H/O: hysterectomy 1977  . Hemorrhoids, internal   . HYPERLIPIDEMIA   . HYPERTENSION   . LEFT BUNDLE BRANCH BLOCK   . LYMPHOMA   . Maintenance chemotherapy   . Skin cancer    melanoma diagnosed elsewhere 1999    Past Surgical History:  Procedure Laterality Date  . APPENDECTOMY  1949  . COLONOSCOPY WITH PROPOFOL N/A 11/16/2013   Procedure: COLONOSCOPY WITH  PROPOFOL;  Surgeon: [name], MD;  Location: [place];  Service: Endoscopy;  Laterality: N/A;  . Lymphoma biopsies    . TOE SURGERY      Social History   Socioeconomic History  . Marital status: Widowed    Spouse name: Not on file  . Number of children: Not on file  . Years of education: Not on file  . Highest education level: Not on file  Occupational History  . Occupation: retired    Fish farm manager: OTHER  Social Needs  . Financial resource strain: Not on file  . Food insecurity:    Worry: Not on file    Inability: Not on file  . Transportation needs:    Medical: Not on file    Non-medical: Not on file  Tobacco Use  . Smoking status: Former Research scientist (life sciences)  . Smokeless tobacco: Never Used  Substance and Sexual Activity  . Alcohol use: No  . Drug use: No  . Sexual activity: Not on file  Lifestyle  . Physical activity:    Days per week: Not on file    Minutes per session: Not on file  . Stress: Not on file  Relationships  . Social connections:    Talks on phone: Not on file    Gets together: Not on file    Attends religious service: Not on file    Active member of club or organization:  Not on file    Attends meetings of clubs or organizations: Not on file    Relationship status: Not on file  . Intimate partner violence:    Fear of current or ex partner: Not on file    Emotionally abused: Not on file    Physically abused: Not on file    Forced sexual activity: Not on file  Other Topics Concern  . Not on file  Social History Narrative  . Not on file    Family History  Problem Relation Age of Onset  . Lymphoma Mother   . Sudden death Father   . Hypertension Neg Hx   . Hyperlipidemia Neg Hx   . Heart attack Neg Hx   . Diabetes Neg Hx     ROS: no fevers or chills, productive cough, hemoptysis, dysphasia, odynophagia, melena, hematochezia, dysuria, hematuria, rash, seizure activity, orthopnea, PND, pedal edema, claudication. Remaining systems are  negative.  Physical Exam: Well-developed well-nourished in no acute distress.  Skin is warm and dry.  HEENT is normal.  Neck is supple.  Chest is clear to auscultation with normal expansion.  Cardiovascular exam is regular rate and rhythm.  Abdominal exam nontender or distended. No masses palpated. Extremities show no edema. neuro grossly intact  ECG-sinus rhythm with occasional PAC and PVC, septal infarct, left anterior fascicular block.  Personally reviewed  A/P  1 cardiomyopathy-We will continue with ARB, beta-blocker and spironolactone.  I will increase her carvedilol to 6.25 mg twice daily.  Not a candidate for ICD given age.  2 hypertension-patient's blood pressure is controlled.  Continue present medications and follow.  3 hyperlipidemia-followed by primary care.  Kirk Ruths, MD

## 2018-09-13 ENCOUNTER — Encounter: Payer: Self-pay | Admitting: Cardiology

## 2018-09-13 NOTE — Telephone Encounter (Signed)
error 

## 2018-09-14 ENCOUNTER — Other Ambulatory Visit: Payer: Self-pay

## 2018-09-14 ENCOUNTER — Ambulatory Visit (INDEPENDENT_AMBULATORY_CARE_PROVIDER_SITE_OTHER): Payer: Medicare Other | Admitting: Cardiology

## 2018-09-14 ENCOUNTER — Encounter: Payer: Self-pay | Admitting: Cardiology

## 2018-09-14 VITALS — BP 134/78 | HR 68 | Ht 60.0 in | Wt 105.4 lb

## 2018-09-14 DIAGNOSIS — I10 Essential (primary) hypertension: Secondary | ICD-10-CM

## 2018-09-14 DIAGNOSIS — I428 Other cardiomyopathies: Secondary | ICD-10-CM

## 2018-09-14 DIAGNOSIS — E7800 Pure hypercholesterolemia, unspecified: Secondary | ICD-10-CM

## 2018-09-14 MED ORDER — CARVEDILOL 6.25 MG PO TABS: 6.2500 mg | ORAL_TABLET | Freq: Two times a day (BID) | ORAL | 3 refills | Status: DC

## 2018-09-14 NOTE — Patient Instructions (Signed)
Medication Instructions:   INCREASE CARVEDILOL TO 6.25 MG TWICE DAILY=2 OF THE 3.125 MG TABLETS TWICE DAILY  Follow-Up:  Your physician wants you to follow-up in: [place] will receive a reminder letter in the mail two months in advance. If you don't receive a letter, please call our office to schedule the follow-up appointment.   CALL IN July TO SCHEDULE APPOINTMENT IN [place]

## 2018-10-19 ENCOUNTER — Telehealth: Payer: Self-pay | Admitting: Cardiology

## 2018-10-19 MED ORDER — METOPROLOL SUCCINATE ER 25 MG PO TB24: 25.0000 mg | ORAL_TABLET | Freq: Every day | ORAL | 3 refills | Status: DC

## 2018-10-19 NOTE — Telephone Encounter (Signed)
Spoke with pt, her carvedilol was doubled at last office visit and she is having itching at bedtime. She did not take the evening dose for the last 2 nights and had no itching. She does not have a problem with the morning dose. She did have itching with the lower dose but it was tolerable. She feels the itching is due to carvedilol. Will forward for dr Stanford Breed review

## 2018-10-19 NOTE — Telephone Encounter (Signed)
DC coreg; toprol 25 mg q hs Kirk Ruths

## 2018-10-19 NOTE — Telephone Encounter (Signed)
Ms Kamrowski called requesting a return call She can be reached at [phone]

## 2018-10-19 NOTE — Telephone Encounter (Signed)
Spoke with pt, Aware of dr crenshaw's recommendations. New script sent to the pharmacy  

## 2019-01-10 ENCOUNTER — Telehealth: Payer: Self-pay | Admitting: Cardiology

## 2019-01-10 NOTE — Telephone Encounter (Signed)
Spoke with pt, Follow up scheduled  

## 2019-01-10 NOTE — Telephone Encounter (Signed)
Cindy Mccormick needs to schedule an appt for September.  Please call patient to discuss [phone]

## 2019-02-06 ENCOUNTER — Other Ambulatory Visit: Payer: Self-pay

## 2019-02-06 MED ORDER — METOPROLOL SUCCINATE ER 25 MG PO TB24: 25.0000 mg | ORAL_TABLET | Freq: Every day | ORAL | 3 refills | Status: DC

## 2019-02-06 NOTE — Telephone Encounter (Signed)
[place] for refill [employer]

## 2019-02-28 ENCOUNTER — Ambulatory Visit: Payer: Medicare Other | Admitting: Hematology and Oncology

## 2019-02-28 ENCOUNTER — Other Ambulatory Visit: Payer: Medicare Other

## 2019-03-24 ENCOUNTER — Encounter (HOSPITAL_COMMUNITY): Payer: Self-pay | Admitting: Emergency Medicine

## 2019-03-24 ENCOUNTER — Other Ambulatory Visit: Payer: Self-pay

## 2019-03-24 ENCOUNTER — Emergency Department (HOSPITAL_COMMUNITY)
Admission: EM | Admit: 2019-03-24 | Discharge: 2019-03-24 | Disposition: A | Discharge status: Home / Self Care | Payer: Medicare Other | Attending: Emergency Medicine | Admitting: Emergency Medicine

## 2019-03-24 ENCOUNTER — Emergency Department (HOSPITAL_COMMUNITY): Payer: Medicare Other

## 2019-03-24 DIAGNOSIS — R55 Syncope and collapse: Secondary | ICD-10-CM | POA: Diagnosis not present

## 2019-03-24 DIAGNOSIS — E860 Dehydration: Secondary | ICD-10-CM | POA: Insufficient documentation

## 2019-03-24 DIAGNOSIS — Z87891 Personal history of nicotine dependence: Secondary | ICD-10-CM | POA: Diagnosis not present

## 2019-03-24 DIAGNOSIS — Z79899 Other long term (current) drug therapy: Secondary | ICD-10-CM | POA: Diagnosis not present

## 2019-03-24 DIAGNOSIS — I10 Essential (primary) hypertension: Secondary | ICD-10-CM | POA: Diagnosis not present

## 2019-03-24 LAB — CBC WITH DIFFERENTIAL/PLATELET
Abs Immature Granulocytes: 0.06 10*3/uL (ref 0.00–0.07)
Basophils Absolute: 0.1 10*3/uL (ref 0.0–0.1)
Basophils Relative: 1 %
Eosinophils Absolute: 0 10*3/uL (ref 0.0–0.5)
Eosinophils Relative: 0 %
HCT: 37.2 % (ref 36.0–46.0)
Hemoglobin: 11.7 g/dL — ABNORMAL LOW (ref 12.0–15.0)
Immature Granulocytes: 1 %
Lymphocytes Relative: 13 %
Lymphs Abs: 1.3 10*3/uL (ref 0.7–4.0)
MCH: 31.5 pg (ref 26.0–34.0)
MCHC: 31.5 g/dL (ref 30.0–36.0)
MCV: 100.3 fL — ABNORMAL HIGH (ref 80.0–100.0)
Monocytes Absolute: 0.4 10*3/uL (ref 0.1–1.0)
Monocytes Relative: 4 %
Neutro Abs: 8.3 10*3/uL — ABNORMAL HIGH (ref 1.7–7.7)
Neutrophils Relative %: 81 %
Platelets: 376 10*3/uL (ref 150–400)
RBC: 3.71 MIL/uL — ABNORMAL LOW (ref 3.87–5.11)
RDW: 13.4 % (ref 11.5–15.5)
WBC: 10.2 10*3/uL (ref 4.0–10.5)
nRBC: 0 % (ref 0.0–0.2)

## 2019-03-24 LAB — CBG MONITORING, ED: Glucose-Capillary: 90 mg/dL (ref 70–99)

## 2019-03-24 LAB — BASIC METABOLIC PANEL
Anion gap: 11 (ref 5–15)
BUN: 15 mg/dL (ref 8–23)
CO2: 23 mmol/L (ref 22–32)
Calcium: 10.4 mg/dL — ABNORMAL HIGH (ref 8.9–10.3)
Chloride: 104 mmol/L (ref 98–111)
Creatinine, Ser: 1.03 mg/dL — ABNORMAL HIGH (ref 0.44–1.00)
GFR calc Af Amer: 56 mL/min — ABNORMAL LOW (ref >60)
GFR calc non Af Amer: 48 mL/min — ABNORMAL LOW (ref >60)
Glucose, Bld: 90 mg/dL (ref 70–99)
Potassium: 4 mmol/L (ref 3.5–5.1)
Sodium: 138 mmol/L (ref 135–145)

## 2019-03-24 MED ORDER — SODIUM CHLORIDE 0.9 % IV BOLUS
1000.0000 mL | Freq: Once | INTRAVENOUS | Status: AC
  Administered 2019-03-24: 1000 mL via INTRAVENOUS

## 2019-03-24 NOTE — ED Triage Notes (Signed)
Pt to ED via [facility] with c/o near syncope  Pt st's she was cooking when she felt like she may pass out .  Pt lowered herself to the floor but did not pass out.  Pt alert and oriented x's 4 on arrival to ED.  CBG by EMS 176

## 2019-03-24 NOTE — Discharge Instructions (Signed)
Get help right away if you: Have a seizure. Have unusual pain in your chest, abdomen, or back. Faint once or repeatedly. Have a severe headache. Are bleeding from your mouth or rectum, or you have black or tarry stool. Have a very fast or irregular heartbeat (palpitations). Are confused. Have trouble walking. Have severe weakness. Have vision problems. 

## 2019-03-24 NOTE — ED Provider Notes (Signed)
[place] EMERGENCY DEPARTMENT Provider Note   CSN: [id number] Arrival date & time: 03/24/19  1531     History   Chief Complaint Chief Complaint  Patient presents with  . Near Syncope    HPI Cindy Mccormick is a 85 y.o. female.  Brought in by EMS for near syncope.  She has a past medical history of cardiomyopathy, hyperlipidemia, hypertension, EKG with left bundle branch block, history of B-cell lymphoma and previous diagnosis of Skin cancer.  Patient was cooking spaghetti when she suddenly felt hot, weak and is she was going to pass out.  Patient states that she headed toward the door but then felt like she had to get down on the ground.  She slid toward the ground and let herself down.  She did hit her left shoulder on a stool but states that she is not having any significant pain.  She did not hit her head or lose consciousness fully.  She was able to scoot over and help her self into a chair and then called her neighbor.  She states that she felt like the room was spinning briefly.  She denies nausea, vomiting, chest pain, shortness of breath, racing or skipping.  She states that she has never had anything like that before.  She has had previous episodes of intermittent vertigo.  She denies a headache.  Her symptoms are currently resolved.  Patient was notably hypertensive with EMS but states that she thinks it was due to the fact that the situation was very stressful for her.  She is not on a blood thinner.  She denies history of DVT or pulmonary embolus.  She denies unilateral leg swelling, recent confinement.     HPI  Past Medical History:  Diagnosis Date  . Cancer [facility])    recurrent lymphoma  . Cancer [facility])    carcinoid cancer per outside record  . CARDIOMYOPATHY   . DIVERTICULITIS, COLON   . H/O: hysterectomy 1977  . Hemorrhoids, internal   . HYPERLIPIDEMIA   . HYPERTENSION   . LEFT BUNDLE BRANCH BLOCK   . LYMPHOMA   . Maintenance chemotherapy   .  Skin cancer    melanoma diagnosed elsewhere 1999    Patient Active Problem List   Diagnosis Date Noted  . Dysuria 02/22/2017  . History of B-cell lymphoma 04/23/2015  . Hypotension 11/09/2013  . Low back pain 06/08/2013  . UNSPECIFIED PERIPHERAL VASCULAR DISEASE 09/05/2010  . CONSTIPATION 06/11/2009  . HEARTBURN 06/11/2009  . History of non-Hodgkin's lymphoma 12/09/2007  . HYPERLIPIDEMIA 12/09/2007  . Essential hypertension 12/09/2007  . CARDIOMYOPATHY 12/09/2007  . LEFT BUNDLE BRANCH BLOCK 12/09/2007  . DIVERTICULITIS, COLON 12/09/2007    Past Surgical History:  Procedure Laterality Date  . APPENDECTOMY  1949  . COLONOSCOPY WITH PROPOFOL N/A 11/16/2013   Procedure: COLONOSCOPY WITH PROPOFOL;  Surgeon: [name], MD;  Location: [place];  Service: Endoscopy;  Laterality: N/A;  . Lymphoma biopsies    . TOE SURGERY       OB History   No obstetric history on file.      Home Medications    Prior to Admission medications   Medication Sig Start Date End Date Taking? Authorizing Provider  aspirin EC 81 MG tablet Take 1 tablet (81 mg total) by mouth daily. 11/22/13   Dellinger, Bobby Rumpf, PA-C  levothyroxine (SYNTHROID, LEVOTHROID) 25 MCG tablet TK 1 T PO QD 01/05/17   [provider]  losartan (COZAAR) 25 MG  tablet TAKE 1 TABLET(25 MG) BY MOUTH DAILY 07/08/17   Lelon Perla, MD  metoprolol succinate (TOPROL XL) 25 MG 24 hr tablet Take 1 tablet (25 mg total) by mouth daily. 02/06/19   Lelon Perla, MD  Multiple Vitamin (MULTIVITAMIN WITH MINERALS) TABS tablet Take 1 tablet by mouth daily.    [provider]  Multiple Vitamins-Minerals (PRESERVISION/LUTEIN PO) Take by mouth.    [provider]  sennosides-docusate sodium (SENOKOT-S) 8.6-50 MG tablet Take 1 tablet by mouth 2 (two) times daily.    [provider]  spironolactone (ALDACTONE) 50 MG tablet Take 50 mg by mouth daily.    [provider]    Family History  Family History  Problem Relation Age of Onset  . Lymphoma Mother   . Sudden death Father   . Hypertension Neg Hx   . Hyperlipidemia Neg Hx   . Heart attack Neg Hx   . Diabetes Neg Hx     Social History Social History   Tobacco Use  . Smoking status: Former Research scientist (life sciences)  . Smokeless tobacco: Never Used  Substance Use Topics  . Alcohol use: No  . Drug use: No     Allergies   Oxycodone, Sulfa antibiotics, Tramadol, Ezetimibe, Hydrocodone-acetaminophen, Lisinopril, Nitrofurantoin, Other, Oxycodone hcl, Sulfonamide derivatives, and Torsemide   Review of Systems Review of Systems Ten systems reviewed and are negative for acute change, except as noted in the HPI.    Physical Exam Updated Vital Signs There were no vitals taken for this visit.  Physical Exam Vitals signs and nursing note reviewed.  Constitutional:      General: She is not in acute distress.    Appearance: She is well-developed. She is not diaphoretic.  HENT:     Head: Normocephalic and atraumatic.  Eyes:     General: No scleral icterus.    Conjunctiva/sclera: Conjunctivae normal.  Neck:     Musculoskeletal: Normal range of motion.  Cardiovascular:     Rate and Rhythm: Normal rate and regular rhythm.     Heart sounds: Normal heart sounds. No murmur. No friction rub. No gallop.   Pulmonary:     Effort: Pulmonary effort is normal. No respiratory distress.     Breath sounds: Normal breath sounds.  Abdominal:     General: Bowel sounds are normal. There is no distension.     Palpations: Abdomen is soft. There is no mass.     Tenderness: There is no abdominal tenderness. There is no guarding.  Musculoskeletal:     Cervical back: She exhibits no bony tenderness.     Thoracic back: She exhibits no bony tenderness.     Lumbar back: She exhibits no bony tenderness.       Back:  Skin:    General: Skin is warm and dry.  Neurological:     Mental Status: She is alert and oriented to person, place, and time.   Psychiatric:        Behavior: Behavior normal.      ED Treatments / Results  Labs (all labs ordered are listed, but only abnormal results are displayed) Labs Reviewed - No data to display  EKG None  Radiology No results found.  Procedures Procedures (including critical care time)  Medications Ordered in ED Medications - No data to display   Initial Impression / Assessment and Plan / ED Course  I have reviewed the triage vital signs and the nursing notes.  Pertinent labs & imaging results that were available  during my care of the patient were reviewed by me and considered in my medical decision making (see chart for details).  Clinical Course as of Mar 24 2215  Fri Mar 24, 2019  1937 Creatinine(!): 1.03 [AH]    Clinical Course User Index [AH] Margarita Mail, PA-C       [id number] syncope VS:  Vitals:   03/24/19 2000 03/24/19 2015 03/24/19 2100 03/24/19 2130  BP: (!) 141/77 (!) 152/65 (!) 143/61 (!) 150/62  Pulse: 88 86 79 79  Resp: 20 20 16 20   Temp:      TempSrc:      SpO2: 97% 97% 99% 98%  Weight:      Height:        [id number] is gathered by patient  and emr. DDX:The differential for syncope is extensive and includes, but is not limited to: arrythmia (Vtach, SVT, SSS, sinus arrest, AV block, bradycardia) aortic stenosis, AMI, HOCM, PE, atrial myxoma, pulmonary hypertension, orthostatic hypotension, (hypovolemia, drug effect, GB syndrome, micturition, cough, swall) carotid sinus sensitivity, Seizure, TIA/CVA, hypoglycemia,  Vertigo.  Labs: I reviewed the labs which show mild macrocytic anemia, BMP shows elevated creatinine of 1.03 up from 0.76, 1-year ago, Imaging: I personally reviewed the images (2 view chest x-ray) which show(s) no acute abnormalities EKG: Normal sinus rhythm with left bundle branch block, unchanged from previous tracings MDM: Patient here with near syncope.  Patient states that all she has had is a cup of coffee and tea this morning and  has not been drinking her usual amount of fluids.  Patient's orthostatic vital signs are positive with a 20 point elevation in her pulse rate from lying to standing.  This combined with her elevated creatinine level is suggestive of dehydration and orthostatic hypotension.  The patient does not wish to be admission admitted for further work-up and has close outpatient cardiology and primary care follow-up.  I had a long discussion with the patient about her work-up and choices and she prefers to go home.  She has a cardiology appointment scheduled for this coming Wednesday.  Patient case discussed with Dr. Francia Greaves who is in agreement with work-up and plan for discharge at this time.  Patient feels comfortable with this plan as well. Patient disposition: Discharge Patient condition: Excellent. The patient appears reasonably screened and/or stabilized for discharge and I doubt any other medical condition or other [facility] requiring further screening, evaluation, or treatment in the ED at this time prior to discharge. I have discussed lab and/or imaging findings with the patient and answered all questions/concerns to the best of my ability. I have discussed return precautions and OP follow up.   ,d  Final Clinical Impressions(s) / ED Diagnoses   Final diagnoses:  None    ED Discharge Orders    None       Margarita Mail, PA-C 03/24/19 2223    Valarie Merino, MD 03/25/19 [phone]

## 2019-03-24 NOTE — ED Notes (Signed)
Pt to xray at this time.

## 2019-03-24 NOTE — ED Notes (Signed)
Gave pt sandwich and coffee

## 2019-03-25 NOTE — Progress Notes (Signed)
HPI: FU nonischemic cardiomyopathy. Echo in Oct of 2012 showed EF 25 to 30, mild LAE and trace AI and MR. Myoview was performed in November of 2012 and showed an ejection fraction of 45%. There was a small partially reversible apical defect suggestive of thinning versus mild ischemia. TSH was normal.  Last echocardiogram March 2019 showed ejection fraction 30 to 35%, mild aortic insufficiency, moderate mitral regurgitation and mild left atrial enlargement. Seen in the emergency room September 18 with dizziness. Hemoglobin 11.7 and potassium 4. Patient was felt to have orthostasis/dehydration.  Since I last saw her  she denies chest pain, dyspnea, palpitations or syncope.  She became dizzy while cooking the other day.  She had preceding warm feeling and diaphoresis.  No chest pain or palpitations and no frank syncope.  Current Outpatient Medications  Medication Sig Dispense Refill  . aspirin EC 81 MG tablet Take 1 tablet (81 mg total) by mouth daily. 90 tablet 3  . levothyroxine (SYNTHROID, LEVOTHROID) 25 MCG tablet Take 25 mcg by mouth daily.   3  . losartan (COZAAR) 25 MG tablet TAKE 1 TABLET(25 MG) BY MOUTH DAILY (Patient taking differently: Take 25 mg by mouth daily. ) 90 tablet 0  . Multiple Vitamin (MULTIVITAMIN WITH MINERALS) TABS tablet Take 1 tablet by mouth daily.    . Multiple Vitamins-Minerals (PRESERVISION/LUTEIN PO) Take by mouth.    . Polyvinyl Alcohol-Povidone (REFRESH OP) Place 1 drop into both eyes daily as needed (For dry eyes).    . sennosides-docusate sodium (SENOKOT-S) 8.6-50 MG tablet Take 1 tablet by mouth 2 (two) times daily.    Marland Kitchen spironolactone (ALDACTONE) 50 MG tablet Take 100 mg by mouth daily.      No current facility-administered medications for this visit.      Past Medical History:  Diagnosis Date  . Cancer [facility])    recurrent lymphoma  . Cancer [facility])    carcinoid cancer per outside record  . CARDIOMYOPATHY   . DIVERTICULITIS, COLON   . H/O:  hysterectomy 1977  . Hemorrhoids, internal   . HYPERLIPIDEMIA   . HYPERTENSION   . LEFT BUNDLE BRANCH BLOCK   . LYMPHOMA   . Maintenance chemotherapy   . Skin cancer    melanoma diagnosed elsewhere 1999    Past Surgical History:  Procedure Laterality Date  . APPENDECTOMY  1949  . COLONOSCOPY WITH PROPOFOL N/A 11/16/2013   Procedure: COLONOSCOPY WITH PROPOFOL;  Surgeon: [name], MD;  Location: [place];  Service: Endoscopy;  Laterality: N/A;  . Lymphoma biopsies    . TOE SURGERY      Social History   Socioeconomic History  . Marital status: Widowed    Spouse name: Not on file  . Number of children: Not on file  . Years of education: Not on file  . Highest education level: Not on file  Occupational History  . Occupation: retired    Fish farm manager: OTHER  Social Needs  . Financial resource strain: Not on file  . Food insecurity    Worry: Not on file    Inability: Not on file  . Transportation needs    Medical: Not on file    Non-medical: Not on file  Tobacco Use  . Smoking status: Former Research scientist (life sciences)  . Smokeless tobacco: Never Used  Substance and Sexual Activity  . Alcohol use: No  . Drug use: No  . Sexual activity: Not on file  Lifestyle  . Physical activity    Days per  week: Not on file    Minutes per session: Not on file  . Stress: Not on file  Relationships  . Social Herbalist on phone: Not on file    Gets together: Not on file    Attends religious service: Not on file    Active member of club or organization: Not on file    Attends meetings of clubs or organizations: Not on file    Relationship status: Not on file  . Intimate partner violence    Fear of current or ex partner: Not on file    Emotionally abused: Not on file    Physically abused: Not on file    Forced sexual activity: Not on file  Other Topics Concern  . Not on file  Social History Narrative  . Not on file    Family History  Problem Relation Age of Onset  . Lymphoma  Mother   . Sudden death Father   . Hypertension Neg Hx   . Hyperlipidemia Neg Hx   . Heart attack Neg Hx   . Diabetes Neg Hx     ROS: no fevers or chills, productive cough, hemoptysis, dysphasia, odynophagia, melena, hematochezia, dysuria, hematuria, rash, seizure activity, orthopnea, PND, pedal edema, claudication. Remaining systems are negative.  Physical Exam: Well-developed well-nourished in no acute distress.  Skin is warm and dry.  HEENT is normal.  Neck is supple.  Chest is clear to auscultation with normal expansion.  Cardiovascular exam is regular rate and rhythm.  Abdominal exam nontender or distended. No masses palpated. Extremities show no edema. neuro grossly intact  ECG-March 24, 2019-sinus rhythm, PAC, left bundle branch block.  Personally reviewed  A/P  1 cardiomyopathy-continue ARB and spironolactone.  She did not tolerate Toprol or carvedilol because of itching.  I will try bisoprolol 2.5 mg daily to see if she tolerates.  She is not having congestive heart failure symptoms at present.  2 hypertension-blood pressure controlled.  Continue present medications and follow.  3 hyperlipidemia-managed by primary care.  4 recent episode of near syncope-patient had transient episode of dizziness but no syncope.  Question vagal episode.  We will follow.  Kirk Ruths, MD

## 2019-03-29 ENCOUNTER — Encounter: Payer: Self-pay | Admitting: Cardiology

## 2019-03-29 ENCOUNTER — Ambulatory Visit (INDEPENDENT_AMBULATORY_CARE_PROVIDER_SITE_OTHER): Payer: Medicare Other | Admitting: Cardiology

## 2019-03-29 ENCOUNTER — Other Ambulatory Visit: Payer: Self-pay

## 2019-03-29 VITALS — BP 140/82 | HR 99 | Ht 60.0 in | Wt 104.4 lb

## 2019-03-29 DIAGNOSIS — I428 Other cardiomyopathies: Secondary | ICD-10-CM | POA: Diagnosis not present

## 2019-03-29 DIAGNOSIS — E7800 Pure hypercholesterolemia, unspecified: Secondary | ICD-10-CM | POA: Diagnosis not present

## 2019-03-29 DIAGNOSIS — I10 Essential (primary) hypertension: Secondary | ICD-10-CM

## 2019-03-29 MED ORDER — BISOPROLOL FUMARATE 5 MG PO TABS: 2.5000 mg | ORAL_TABLET | Freq: Every day | ORAL | 3 refills | Status: DC

## 2019-03-29 NOTE — Patient Instructions (Signed)
Medication Instructions:  START BISOPROLOL 2.5 MG ONCE DAILY= 1/2 OF THE 5 MG TABLET ONCE DAILY  If you need a refill on your cardiac medications before your next appointment, please call your pharmacy.   Lab work If you have labs (blood work) drawn today and your tests are completely normal, you will receive your results only by: Marland Kitchen MyChart Message (if you have MyChart) OR . A paper copy in the mail If you have any lab test that is abnormal or we need to change your treatment, we will call you to review the results  Follow-Up: At [facility], you and your health needs are our priority.  As part of our continuing mission to provide you with exceptional heart care, we have created designated Provider Care Teams.  These Care Teams include your primary Cardiologist (physician) and Advanced Practice Providers (APPs -  Physician Assistants and Nurse Practitioners) who all work together to provide you with the care you need, when you need it. Your physician wants you to follow-up in: [place] will receive a reminder letter in the mail two months in advance. If you don't receive a letter, please call our office to schedule the follow-up appointment.

## 2019-04-05 ENCOUNTER — Telehealth: Payer: Self-pay | Admitting: Cardiology

## 2019-04-05 DIAGNOSIS — I428 Other cardiomyopathies: Secondary | ICD-10-CM

## 2019-04-05 MED ORDER — LOSARTAN POTASSIUM 25 MG PO TABS: ORAL_TABLET | ORAL | 3 refills | Status: DC

## 2019-04-05 NOTE — Telephone Encounter (Signed)
Spoke with the patient. She stated that she will need a refill sent in for the Losartan. This has been done for her.  She also stated that the Bisoprolol has made her feel weak and lightheaded so she stopped taking it. She did confirm that she was taking a whole tablet, 5 mg once daily, instead of the prescribed 2.5 mg (half tablet). She will try taking half a tablet and call back if she becomes symptomatic.

## 2019-04-05 NOTE — Telephone Encounter (Signed)
New Message  Pt c/o medication issue:  1. Name of Medication: losartan (COZAAR) 25 MG tablet  2. How are you currently taking this medication (dosage and times per day)?  TAKE 1 TABLET(25 MG) BY MOUTH DAILY Patient taking differently: Take 25 mg by mouth daily.  3. Are you having a reaction (difficulty breathing--STAT)? No  4. What is your medication issue? Patient states that the Pharmacy has been unable to get the medication for her prescription. Asking for Debra (Dr. Jacalyn Lefevre nurse) to give her a call back to discuss.

## 2019-10-30 DIAGNOSIS — N1831 Chronic kidney disease, stage 3a: Secondary | ICD-10-CM | POA: Diagnosis not present

## 2019-10-30 DIAGNOSIS — I509 Heart failure, unspecified: Secondary | ICD-10-CM | POA: Diagnosis not present

## 2019-10-30 DIAGNOSIS — E039 Hypothyroidism, unspecified: Secondary | ICD-10-CM | POA: Diagnosis not present

## 2019-10-30 DIAGNOSIS — I10 Essential (primary) hypertension: Secondary | ICD-10-CM | POA: Diagnosis not present

## 2019-10-30 DIAGNOSIS — E785 Hyperlipidemia, unspecified: Secondary | ICD-10-CM | POA: Diagnosis not present

## 2019-11-10 DIAGNOSIS — N1831 Chronic kidney disease, stage 3a: Secondary | ICD-10-CM | POA: Diagnosis not present

## 2019-11-10 DIAGNOSIS — I10 Essential (primary) hypertension: Secondary | ICD-10-CM | POA: Diagnosis not present

## 2019-11-10 DIAGNOSIS — E039 Hypothyroidism, unspecified: Secondary | ICD-10-CM | POA: Diagnosis not present

## 2019-11-10 DIAGNOSIS — I509 Heart failure, unspecified: Secondary | ICD-10-CM | POA: Diagnosis not present

## 2019-11-10 DIAGNOSIS — E785 Hyperlipidemia, unspecified: Secondary | ICD-10-CM | POA: Diagnosis not present

## 2019-12-01 DIAGNOSIS — I10 Essential (primary) hypertension: Secondary | ICD-10-CM | POA: Diagnosis not present

## 2019-12-11 ENCOUNTER — Other Ambulatory Visit: Payer: Self-pay | Admitting: Internal Medicine

## 2019-12-11 DIAGNOSIS — Z0000 Encounter for general adult medical examination without abnormal findings: Secondary | ICD-10-CM | POA: Diagnosis not present

## 2019-12-11 DIAGNOSIS — Q619 Cystic kidney disease, unspecified: Secondary | ICD-10-CM | POA: Diagnosis not present

## 2019-12-11 DIAGNOSIS — R1935 Periumbilic abdominal rigidity: Secondary | ICD-10-CM

## 2019-12-11 DIAGNOSIS — I509 Heart failure, unspecified: Secondary | ICD-10-CM | POA: Diagnosis not present

## 2019-12-11 DIAGNOSIS — K5790 Diverticulosis of intestine, part unspecified, without perforation or abscess without bleeding: Secondary | ICD-10-CM | POA: Diagnosis not present

## 2019-12-11 DIAGNOSIS — Z1389 Encounter for screening for other disorder: Secondary | ICD-10-CM | POA: Diagnosis not present

## 2019-12-11 DIAGNOSIS — N1831 Chronic kidney disease, stage 3a: Secondary | ICD-10-CM | POA: Diagnosis not present

## 2019-12-11 DIAGNOSIS — E785 Hyperlipidemia, unspecified: Secondary | ICD-10-CM | POA: Diagnosis not present

## 2019-12-11 DIAGNOSIS — G4700 Insomnia, unspecified: Secondary | ICD-10-CM | POA: Diagnosis not present

## 2019-12-11 DIAGNOSIS — I10 Essential (primary) hypertension: Secondary | ICD-10-CM | POA: Diagnosis not present

## 2019-12-11 DIAGNOSIS — E039 Hypothyroidism, unspecified: Secondary | ICD-10-CM | POA: Diagnosis not present

## 2019-12-11 DIAGNOSIS — I429 Cardiomyopathy, unspecified: Secondary | ICD-10-CM | POA: Diagnosis not present

## 2019-12-12 ENCOUNTER — Other Ambulatory Visit: Payer: Self-pay

## 2019-12-12 ENCOUNTER — Ambulatory Visit
Admission: RE | Admit: 2019-12-12 | Discharge: 2019-12-12 | Disposition: A | Discharge status: Home / Self Care | Payer: Medicare PPO | Source: Ambulatory Visit | Attending: Internal Medicine | Admitting: Internal Medicine

## 2019-12-12 DIAGNOSIS — R1935 Periumbilic abdominal rigidity: Secondary | ICD-10-CM

## 2019-12-12 DIAGNOSIS — K8020 Calculus of gallbladder without cholecystitis without obstruction: Secondary | ICD-10-CM | POA: Diagnosis not present

## 2019-12-12 MED ORDER — IOPAMIDOL (ISOVUE-300) INJECTION 61%
100.0000 mL | Freq: Once | INTRAVENOUS | Status: AC | PRN
  Administered 2019-12-12: 100 mL via INTRAVENOUS

## 2019-12-13 ENCOUNTER — Telehealth: Payer: Self-pay

## 2019-12-13 NOTE — Telephone Encounter (Signed)
Called and left a message asking her to call the office back. Dr. Alvy Bimler would like to schedule appt the week of 6/21.

## 2019-12-14 NOTE — Telephone Encounter (Signed)
TC to Pt to follow up about message left to return call to Dr. Alvy Bimler office. Spoke with Pt who stated she was waiting to call to hear the results of the CT scan. Relayed message to Pt per Dr. Alvy Bimler that the CT scan showed no signs of lymphoma recurrence and that the appointment was scheduled to discuss her recent weight loss. Pt stated she is ok not seeing Dr. Alvy Bimler and was happy with the results. Pt. Verbalized the information that was presented to her. No further problems or concerns noted.

## 2019-12-26 ENCOUNTER — Other Ambulatory Visit: Payer: Medicare Other

## 2020-01-01 DIAGNOSIS — I509 Heart failure, unspecified: Secondary | ICD-10-CM | POA: Diagnosis not present

## 2020-01-01 DIAGNOSIS — E785 Hyperlipidemia, unspecified: Secondary | ICD-10-CM | POA: Diagnosis not present

## 2020-01-01 DIAGNOSIS — E039 Hypothyroidism, unspecified: Secondary | ICD-10-CM | POA: Diagnosis not present

## 2020-01-01 DIAGNOSIS — I10 Essential (primary) hypertension: Secondary | ICD-10-CM | POA: Diagnosis not present

## 2020-01-01 DIAGNOSIS — N1831 Chronic kidney disease, stage 3a: Secondary | ICD-10-CM | POA: Diagnosis not present

## 2020-01-02 DIAGNOSIS — N390 Urinary tract infection, site not specified: Secondary | ICD-10-CM | POA: Diagnosis not present

## 2020-01-02 DIAGNOSIS — K5790 Diverticulosis of intestine, part unspecified, without perforation or abscess without bleeding: Secondary | ICD-10-CM | POA: Diagnosis not present

## 2020-01-02 DIAGNOSIS — I10 Essential (primary) hypertension: Secondary | ICD-10-CM | POA: Diagnosis not present

## 2020-01-02 DIAGNOSIS — R1935 Periumbilic abdominal rigidity: Secondary | ICD-10-CM | POA: Diagnosis not present

## 2020-01-03 DIAGNOSIS — I10 Essential (primary) hypertension: Secondary | ICD-10-CM | POA: Diagnosis not present

## 2020-01-17 ENCOUNTER — Telehealth: Payer: Self-pay | Admitting: *Deleted

## 2020-01-17 NOTE — Telephone Encounter (Signed)
Received letter from patient, due to her age and traveling concerns she would like to be seen as needed. She will continue to follow up with dr gates and call us if needed. Okay per dr Stanford Breed, patient made aware.

## 2020-02-02 DIAGNOSIS — I10 Essential (primary) hypertension: Secondary | ICD-10-CM | POA: Diagnosis not present

## 2020-02-03 DIAGNOSIS — I10 Essential (primary) hypertension: Secondary | ICD-10-CM | POA: Diagnosis not present

## 2020-03-04 DIAGNOSIS — D485 Neoplasm of uncertain behavior of skin: Secondary | ICD-10-CM | POA: Diagnosis not present

## 2020-03-04 DIAGNOSIS — L720 Epidermal cyst: Secondary | ICD-10-CM | POA: Diagnosis not present

## 2020-03-04 DIAGNOSIS — D224 Melanocytic nevi of scalp and neck: Secondary | ICD-10-CM | POA: Diagnosis not present

## 2020-03-04 DIAGNOSIS — L570 Actinic keratosis: Secondary | ICD-10-CM | POA: Diagnosis not present

## 2020-03-05 DIAGNOSIS — I10 Essential (primary) hypertension: Secondary | ICD-10-CM | POA: Diagnosis not present

## 2020-03-21 DIAGNOSIS — D485 Neoplasm of uncertain behavior of skin: Secondary | ICD-10-CM | POA: Diagnosis not present

## 2020-03-21 DIAGNOSIS — L988 Other specified disorders of the skin and subcutaneous tissue: Secondary | ICD-10-CM | POA: Diagnosis not present

## 2020-04-03 DIAGNOSIS — H353131 Nonexudative age-related macular degeneration, bilateral, early dry stage: Secondary | ICD-10-CM | POA: Diagnosis not present

## 2020-04-03 DIAGNOSIS — H16223 Keratoconjunctivitis sicca, not specified as Sjogren's, bilateral: Secondary | ICD-10-CM | POA: Diagnosis not present

## 2020-04-03 DIAGNOSIS — H5213 Myopia, bilateral: Secondary | ICD-10-CM | POA: Diagnosis not present

## 2020-04-04 DIAGNOSIS — I10 Essential (primary) hypertension: Secondary | ICD-10-CM | POA: Diagnosis not present

## 2020-04-04 DIAGNOSIS — N1831 Chronic kidney disease, stage 3a: Secondary | ICD-10-CM | POA: Diagnosis not present

## 2020-04-04 DIAGNOSIS — E039 Hypothyroidism, unspecified: Secondary | ICD-10-CM | POA: Diagnosis not present

## 2020-04-04 DIAGNOSIS — E785 Hyperlipidemia, unspecified: Secondary | ICD-10-CM | POA: Diagnosis not present

## 2020-04-04 DIAGNOSIS — I509 Heart failure, unspecified: Secondary | ICD-10-CM | POA: Diagnosis not present

## 2020-05-02 DIAGNOSIS — I509 Heart failure, unspecified: Secondary | ICD-10-CM | POA: Diagnosis not present

## 2020-05-02 DIAGNOSIS — N1831 Chronic kidney disease, stage 3a: Secondary | ICD-10-CM | POA: Diagnosis not present

## 2020-05-02 DIAGNOSIS — I10 Essential (primary) hypertension: Secondary | ICD-10-CM | POA: Diagnosis not present

## 2020-05-02 DIAGNOSIS — E039 Hypothyroidism, unspecified: Secondary | ICD-10-CM | POA: Diagnosis not present

## 2020-05-02 DIAGNOSIS — E785 Hyperlipidemia, unspecified: Secondary | ICD-10-CM | POA: Diagnosis not present

## 2020-05-03 DIAGNOSIS — I10 Essential (primary) hypertension: Secondary | ICD-10-CM | POA: Diagnosis not present

## 2020-05-03 DIAGNOSIS — I509 Heart failure, unspecified: Secondary | ICD-10-CM | POA: Diagnosis not present

## 2020-06-04 DIAGNOSIS — I509 Heart failure, unspecified: Secondary | ICD-10-CM | POA: Diagnosis not present

## 2020-06-04 DIAGNOSIS — E785 Hyperlipidemia, unspecified: Secondary | ICD-10-CM | POA: Diagnosis not present

## 2020-06-04 DIAGNOSIS — G4700 Insomnia, unspecified: Secondary | ICD-10-CM | POA: Diagnosis not present

## 2020-06-04 DIAGNOSIS — E039 Hypothyroidism, unspecified: Secondary | ICD-10-CM | POA: Diagnosis not present

## 2020-06-04 DIAGNOSIS — N1831 Chronic kidney disease, stage 3a: Secondary | ICD-10-CM | POA: Diagnosis not present

## 2020-06-04 DIAGNOSIS — I10 Essential (primary) hypertension: Secondary | ICD-10-CM | POA: Diagnosis not present

## 2020-07-04 DIAGNOSIS — I10 Essential (primary) hypertension: Secondary | ICD-10-CM | POA: Diagnosis not present

## 2020-07-04 DIAGNOSIS — N1831 Chronic kidney disease, stage 3a: Secondary | ICD-10-CM | POA: Diagnosis not present

## 2020-07-04 DIAGNOSIS — I509 Heart failure, unspecified: Secondary | ICD-10-CM | POA: Diagnosis not present

## 2020-07-04 DIAGNOSIS — E785 Hyperlipidemia, unspecified: Secondary | ICD-10-CM | POA: Diagnosis not present

## 2020-07-04 DIAGNOSIS — E039 Hypothyroidism, unspecified: Secondary | ICD-10-CM | POA: Diagnosis not present

## 2020-07-04 DIAGNOSIS — G4700 Insomnia, unspecified: Secondary | ICD-10-CM | POA: Diagnosis not present

## 2020-08-05 DIAGNOSIS — I10 Essential (primary) hypertension: Secondary | ICD-10-CM | POA: Diagnosis not present

## 2020-08-05 DIAGNOSIS — I509 Heart failure, unspecified: Secondary | ICD-10-CM | POA: Diagnosis not present

## 2020-08-05 DIAGNOSIS — G4700 Insomnia, unspecified: Secondary | ICD-10-CM | POA: Diagnosis not present

## 2020-08-05 DIAGNOSIS — E785 Hyperlipidemia, unspecified: Secondary | ICD-10-CM | POA: Diagnosis not present

## 2020-08-05 DIAGNOSIS — E039 Hypothyroidism, unspecified: Secondary | ICD-10-CM | POA: Diagnosis not present

## 2020-08-05 DIAGNOSIS — N1831 Chronic kidney disease, stage 3a: Secondary | ICD-10-CM | POA: Diagnosis not present

## 2020-09-02 DIAGNOSIS — I10 Essential (primary) hypertension: Secondary | ICD-10-CM | POA: Diagnosis not present

## 2020-09-10 DIAGNOSIS — I509 Heart failure, unspecified: Secondary | ICD-10-CM | POA: Diagnosis not present

## 2020-09-10 DIAGNOSIS — I10 Essential (primary) hypertension: Secondary | ICD-10-CM | POA: Diagnosis not present

## 2020-09-10 DIAGNOSIS — E039 Hypothyroidism, unspecified: Secondary | ICD-10-CM | POA: Diagnosis not present

## 2020-09-10 DIAGNOSIS — G4700 Insomnia, unspecified: Secondary | ICD-10-CM | POA: Diagnosis not present

## 2020-09-10 DIAGNOSIS — N1831 Chronic kidney disease, stage 3a: Secondary | ICD-10-CM | POA: Diagnosis not present

## 2020-09-10 DIAGNOSIS — E785 Hyperlipidemia, unspecified: Secondary | ICD-10-CM | POA: Diagnosis not present

## 2020-10-03 DIAGNOSIS — I10 Essential (primary) hypertension: Secondary | ICD-10-CM | POA: Diagnosis not present

## 2020-10-08 DIAGNOSIS — G4700 Insomnia, unspecified: Secondary | ICD-10-CM | POA: Diagnosis not present

## 2020-10-08 DIAGNOSIS — E785 Hyperlipidemia, unspecified: Secondary | ICD-10-CM | POA: Diagnosis not present

## 2020-10-08 DIAGNOSIS — I10 Essential (primary) hypertension: Secondary | ICD-10-CM | POA: Diagnosis not present

## 2020-10-08 DIAGNOSIS — N1831 Chronic kidney disease, stage 3a: Secondary | ICD-10-CM | POA: Diagnosis not present

## 2020-10-08 DIAGNOSIS — E039 Hypothyroidism, unspecified: Secondary | ICD-10-CM | POA: Diagnosis not present

## 2020-10-08 DIAGNOSIS — I509 Heart failure, unspecified: Secondary | ICD-10-CM | POA: Diagnosis not present

## 2020-11-01 DIAGNOSIS — I10 Essential (primary) hypertension: Secondary | ICD-10-CM | POA: Diagnosis not present

## 2020-11-15 DIAGNOSIS — I10 Essential (primary) hypertension: Secondary | ICD-10-CM | POA: Diagnosis not present

## 2020-11-15 DIAGNOSIS — E039 Hypothyroidism, unspecified: Secondary | ICD-10-CM | POA: Diagnosis not present

## 2020-11-15 DIAGNOSIS — I509 Heart failure, unspecified: Secondary | ICD-10-CM | POA: Diagnosis not present

## 2020-11-15 DIAGNOSIS — N1831 Chronic kidney disease, stage 3a: Secondary | ICD-10-CM | POA: Diagnosis not present

## 2020-11-15 DIAGNOSIS — E785 Hyperlipidemia, unspecified: Secondary | ICD-10-CM | POA: Diagnosis not present

## 2020-11-15 DIAGNOSIS — G4700 Insomnia, unspecified: Secondary | ICD-10-CM | POA: Diagnosis not present

## 2020-12-03 DIAGNOSIS — I10 Essential (primary) hypertension: Secondary | ICD-10-CM | POA: Diagnosis not present

## 2020-12-25 DIAGNOSIS — I509 Heart failure, unspecified: Secondary | ICD-10-CM | POA: Diagnosis not present

## 2020-12-25 DIAGNOSIS — H9193 Unspecified hearing loss, bilateral: Secondary | ICD-10-CM | POA: Diagnosis not present

## 2020-12-25 DIAGNOSIS — I10 Essential (primary) hypertension: Secondary | ICD-10-CM | POA: Diagnosis not present

## 2020-12-25 DIAGNOSIS — Q619 Cystic kidney disease, unspecified: Secondary | ICD-10-CM | POA: Diagnosis not present

## 2020-12-25 DIAGNOSIS — Z0000 Encounter for general adult medical examination without abnormal findings: Secondary | ICD-10-CM | POA: Diagnosis not present

## 2020-12-25 DIAGNOSIS — Z8572 Personal history of non-Hodgkin lymphomas: Secondary | ICD-10-CM | POA: Diagnosis not present

## 2020-12-25 DIAGNOSIS — I429 Cardiomyopathy, unspecified: Secondary | ICD-10-CM | POA: Diagnosis not present

## 2020-12-25 DIAGNOSIS — E559 Vitamin D deficiency, unspecified: Secondary | ICD-10-CM | POA: Diagnosis not present

## 2020-12-25 DIAGNOSIS — N1831 Chronic kidney disease, stage 3a: Secondary | ICD-10-CM | POA: Diagnosis not present

## 2020-12-25 DIAGNOSIS — R351 Nocturia: Secondary | ICD-10-CM | POA: Diagnosis not present

## 2020-12-25 DIAGNOSIS — E785 Hyperlipidemia, unspecified: Secondary | ICD-10-CM | POA: Diagnosis not present

## 2020-12-25 DIAGNOSIS — Z1389 Encounter for screening for other disorder: Secondary | ICD-10-CM | POA: Diagnosis not present

## 2020-12-25 DIAGNOSIS — E039 Hypothyroidism, unspecified: Secondary | ICD-10-CM | POA: Diagnosis not present

## 2021-01-01 DIAGNOSIS — I509 Heart failure, unspecified: Secondary | ICD-10-CM | POA: Diagnosis not present

## 2021-01-01 DIAGNOSIS — E039 Hypothyroidism, unspecified: Secondary | ICD-10-CM | POA: Diagnosis not present

## 2021-01-01 DIAGNOSIS — I10 Essential (primary) hypertension: Secondary | ICD-10-CM | POA: Diagnosis not present

## 2021-01-01 DIAGNOSIS — N1831 Chronic kidney disease, stage 3a: Secondary | ICD-10-CM | POA: Diagnosis not present

## 2021-01-01 DIAGNOSIS — E785 Hyperlipidemia, unspecified: Secondary | ICD-10-CM | POA: Diagnosis not present

## 2021-01-02 DIAGNOSIS — I10 Essential (primary) hypertension: Secondary | ICD-10-CM | POA: Diagnosis not present

## 2021-01-27 DIAGNOSIS — N390 Urinary tract infection, site not specified: Secondary | ICD-10-CM | POA: Diagnosis not present

## 2021-01-30 DIAGNOSIS — I10 Essential (primary) hypertension: Secondary | ICD-10-CM | POA: Diagnosis not present

## 2021-01-31 DIAGNOSIS — N1831 Chronic kidney disease, stage 3a: Secondary | ICD-10-CM | POA: Diagnosis not present

## 2021-01-31 DIAGNOSIS — I10 Essential (primary) hypertension: Secondary | ICD-10-CM | POA: Diagnosis not present

## 2021-01-31 DIAGNOSIS — I509 Heart failure, unspecified: Secondary | ICD-10-CM | POA: Diagnosis not present

## 2021-01-31 DIAGNOSIS — E785 Hyperlipidemia, unspecified: Secondary | ICD-10-CM | POA: Diagnosis not present

## 2021-01-31 DIAGNOSIS — E039 Hypothyroidism, unspecified: Secondary | ICD-10-CM | POA: Diagnosis not present

## 2021-03-05 DIAGNOSIS — E785 Hyperlipidemia, unspecified: Secondary | ICD-10-CM | POA: Diagnosis not present

## 2021-03-05 DIAGNOSIS — N1831 Chronic kidney disease, stage 3a: Secondary | ICD-10-CM | POA: Diagnosis not present

## 2021-03-05 DIAGNOSIS — I10 Essential (primary) hypertension: Secondary | ICD-10-CM | POA: Diagnosis not present

## 2021-03-05 DIAGNOSIS — E039 Hypothyroidism, unspecified: Secondary | ICD-10-CM | POA: Diagnosis not present

## 2021-03-05 DIAGNOSIS — I509 Heart failure, unspecified: Secondary | ICD-10-CM | POA: Diagnosis not present

## 2021-03-07 ENCOUNTER — Other Ambulatory Visit: Payer: Self-pay

## 2021-03-07 ENCOUNTER — Telehealth: Payer: Self-pay

## 2021-03-07 DIAGNOSIS — Z8572 Personal history of non-Hodgkin lymphomas: Secondary | ICD-10-CM

## 2021-03-07 NOTE — Telephone Encounter (Signed)
Called regarding appt on 9/6. She has a area that she is concerned about on her leg. Review appt times. She verbalized understanding.

## 2021-03-11 ENCOUNTER — Inpatient Hospital Stay: Payer: Medicare PPO | Admitting: Hematology and Oncology

## 2021-03-11 ENCOUNTER — Other Ambulatory Visit: Payer: Self-pay

## 2021-03-11 ENCOUNTER — Telehealth: Payer: Self-pay

## 2021-03-11 ENCOUNTER — Inpatient Hospital Stay: Payer: Medicare PPO | Attending: Hematology and Oncology

## 2021-03-11 VITALS — BP 168/63 | HR 70 | Temp 98.0°F | Resp 18 | Ht 60.0 in | Wt 86.6 lb

## 2021-03-11 DIAGNOSIS — Z8572 Personal history of non-Hodgkin lymphomas: Secondary | ICD-10-CM | POA: Insufficient documentation

## 2021-03-11 DIAGNOSIS — Z7982 Long term (current) use of aspirin: Secondary | ICD-10-CM | POA: Insufficient documentation

## 2021-03-11 DIAGNOSIS — I10 Essential (primary) hypertension: Secondary | ICD-10-CM

## 2021-03-11 DIAGNOSIS — R634 Abnormal weight loss: Secondary | ICD-10-CM | POA: Diagnosis not present

## 2021-03-11 DIAGNOSIS — L820 Inflamed seborrheic keratosis: Secondary | ICD-10-CM | POA: Diagnosis not present

## 2021-03-11 DIAGNOSIS — Z79899 Other long term (current) drug therapy: Secondary | ICD-10-CM | POA: Diagnosis not present

## 2021-03-11 DIAGNOSIS — Z9221 Personal history of antineoplastic chemotherapy: Secondary | ICD-10-CM | POA: Diagnosis not present

## 2021-03-11 LAB — CBC WITH DIFFERENTIAL (CANCER CENTER ONLY)
Abs Immature Granulocytes: 0.02 10*3/uL (ref 0.00–0.07)
Basophils Absolute: 0.1 10*3/uL (ref 0.0–0.1)
Basophils Relative: 2 %
Eosinophils Absolute: 0.1 10*3/uL (ref 0.0–0.5)
Eosinophils Relative: 2 %
HCT: 39.5 % (ref 36.0–46.0)
Hemoglobin: 12.6 g/dL (ref 12.0–15.0)
Immature Granulocytes: 0 %
Lymphocytes Relative: 27 %
Lymphs Abs: 1.6 10*3/uL (ref 0.7–4.0)
MCH: 31.3 pg (ref 26.0–34.0)
MCHC: 31.9 g/dL (ref 30.0–36.0)
MCV: 98 fL (ref 80.0–100.0)
Monocytes Absolute: 0.5 10*3/uL (ref 0.1–1.0)
Monocytes Relative: 8 %
Neutro Abs: 3.6 10*3/uL (ref 1.7–7.7)
Neutrophils Relative %: 61 %
Platelet Count: 363 10*3/uL (ref 150–400)
RBC: 4.03 MIL/uL (ref 3.87–5.11)
RDW: 13.5 % (ref 11.5–15.5)
WBC Count: 6 10*3/uL (ref 4.0–10.5)
nRBC: 0 % (ref 0.0–0.2)

## 2021-03-11 LAB — CMP (CANCER CENTER ONLY)
ALT: 13 U/L (ref 0–44)
AST: 19 U/L (ref 15–41)
Albumin: 3.7 g/dL (ref 3.5–5.0)
Alkaline Phosphatase: 86 U/L (ref 38–126)
Anion gap: 11 (ref 5–15)
BUN: 18 mg/dL (ref 8–23)
CO2: 27 mmol/L (ref 22–32)
Calcium: 10.7 mg/dL — ABNORMAL HIGH (ref 8.9–10.3)
Chloride: 102 mmol/L (ref 98–111)
Creatinine: 0.88 mg/dL (ref 0.44–1.00)
GFR, Estimated: >60 mL/min (ref >60)
Glucose, Bld: 78 mg/dL (ref 70–99)
Potassium: 4.1 mmol/L (ref 3.5–5.1)
Sodium: 140 mmol/L (ref 135–145)
Total Bilirubin: 0.4 mg/dL (ref 0.3–1.2)
Total Protein: 7 g/dL (ref 6.5–8.1)

## 2021-03-11 NOTE — Telephone Encounter (Signed)
Returned her call. Instructed to pick up oral contrast prior to CT san on 9/14 at [facility]. On the day of CT scan drink the first bottle at 0830 and 2 nd bottle at 0930. She verbalized understanding.

## 2021-03-12 ENCOUNTER — Telehealth: Payer: Self-pay

## 2021-03-12 ENCOUNTER — Encounter: Payer: Self-pay | Admitting: Hematology and Oncology

## 2021-03-12 DIAGNOSIS — L820 Inflamed seborrheic keratosis: Secondary | ICD-10-CM | POA: Insufficient documentation

## 2021-03-12 NOTE — Assessment & Plan Note (Signed)
She had profound unintentional weight loss She looks cachectic and malnourished I am definitely concerned about potential disease relapse I recommend CT imaging of the chest, abdomen and pelvis for evaluation and she is in agreement

## 2021-03-12 NOTE — Telephone Encounter (Signed)
Called and scheduled appt with Dr. Alvy Bimler on 9/16 at 1130. She is aware of appt time/date.

## 2021-03-12 NOTE — Progress Notes (Signed)
[facility] OFFICE PROGRESS NOTE  Patient Care Team: Josetta Huddle, MD as PCP - General (Internal Medicine) Heath Lark, MD as Consulting Physician (Hematology and Oncology)  ASSESSMENT & PLAN:  History of non-Hodgkin's lymphoma She had profound unintentional weight loss She looks cachectic and malnourished I am definitely concerned about potential disease relapse I recommend CT imaging of the chest, abdomen and pelvis for evaluation and she is in agreement  Essential hypertension Elevated blood pressure today could be due to anxiety Observe for now  Seborrheic keratoses, inflamed She has inflamed seborrheic keratosis on her leg She is reassured this is a benign condition  Weight loss, non-intentional She looks thin and cachectic According to the patient, her thyroid function test was within normal range She stated she has good appetite I cannot explain her profound weight loss and recommend CT imaging We discussed risk and benefits of appetite stimulant but she did not think she needs it  Orders Placed This Encounter  Procedures   CT CHEST ABDOMEN PELVIS W CONTRAST    Standing Status:   Future    Standing Expiration Date:   03/11/2022    Order Specific Question:   Preferred imaging location?    Answer:   [facility]    Order Specific Question:   Radiology Contrast Protocol - do NOT remove file path    Answer:   [email or web address]    All questions were answered. The patient knows to call the clinic with any problems, questions or concerns. The total time spent in the appointment was 20 minutes encounter with patients including review of chart and various tests results, discussions about plan of care and coordination of care plan   Heath Lark, MD 03/12/2021 9:24 AM  INTERVAL HISTORY: Please see below for problem oriented charting. she returns to be seen because she is concerned about weight loss and a skin lesion  on her leg She had CT imaging last year ordered by her primary care physician because she had profound weight loss She continues to lose weight She stated she has good appetite Denies recent changes in bowel habits No new lymphadenopathy Denies recent infection She has noted skin changes on her leg  REVIEW OF SYSTEMS:   Constitutional: Denies fevers, chills  Eyes: Denies blurriness of vision Ears, nose, mouth, throat, and face: Denies mucositis or sore throat Respiratory: Denies cough, dyspnea or wheezes Cardiovascular: Denies palpitation, chest discomfort or lower extremity swelling Gastrointestinal:  Denies nausea, heartburn or change in bowel habits Lymphatics: Denies new lymphadenopathy or easy bruising Neurological:Denies numbness, tingling or new weaknesses Behavioral/Psych: Mood is stable, no new changes  All other systems were reviewed with the patient and are negative.  I have reviewed the past medical history, past surgical history, social history and family history with the patient and they are unchanged from previous note.  ALLERGIES:  is allergic to oxycodone, sulfa antibiotics, tramadol, ezetimibe, hydrocodone-acetaminophen, lisinopril, nitrofurantoin, other, oxycodone hcl, sulfonamide derivatives, and torsemide.  MEDICATIONS:  Current Outpatient Medications  Medication Sig Dispense Refill   diltiazem (DILTIAZEM CD) 180 MG 24 hr capsule Take 180 mg by mouth daily.     Vitamin D, Cholecalciferol, 25 MCG (1000 UT) CAPS Take 1 capsule by mouth daily.     aspirin EC 81 MG tablet Take 1 tablet (81 mg total) by mouth daily. 90 tablet 3   levothyroxine (SYNTHROID, LEVOTHROID) 25 MCG tablet Take 25 mcg by mouth daily.   3   losartan (COZAAR) 25 MG tablet TAKE 1  TABLET(25 MG) BY MOUTH DAILY (Patient taking differently: TAKE 1 TABLET(25 MG) BY MOUTH 3 X A WEEK) 90 tablet 3   Multiple Vitamin (MULTIVITAMIN WITH MINERALS) TABS tablet Take 1 tablet by mouth daily.     Multiple  Vitamins-Minerals (PRESERVISION/LUTEIN PO) Take by mouth.     Polyvinyl Alcohol-Povidone (REFRESH OP) Place 1 drop into both eyes daily as needed (For dry eyes).     sennosides-docusate sodium (SENOKOT-S) 8.6-50 MG tablet Take 1 tablet by mouth 2 (two) times daily.     spironolactone (ALDACTONE) 50 MG tablet Take 50 mg by mouth daily.     No current facility-administered medications for this visit.    SUMMARY OF ONCOLOGIC HISTORY: Oncology History Overview Note  LYMPHOMA, lymphoplasmacytic    Primary site: Lymphoid Neoplasms   Staging method: AJCC 6th Edition   Clinical: Stage III signed by Heath Lark, MD on 11/28/2013  9:06 PM   Summary: Stage III A Initial IPI score of 2 Diagnosed elsewhere, CD 20 positive   History of non-Hodgkin's lymphoma  05/31/1995 Imaging   Date approximate. Ct imaging for hematuria showed large retroperitoneal mass with left ureter involvment   06/01/1995 Surgery   She had laparotomy and bilateral salpingo-oophorectomy and biopsy to be proven to be lymphoma.   06/07/1995 - 11/04/1995 Chemotherapy   She completed 6 cycles of CHOP plus etoposide chemotherapy   01/04/2004 Imaging   Imaging showed recurrence, confirmed on PET/CT scan.   01/07/2004 Procedure   Repeat biopsy confirmed CD20 positive lymphoma   01/14/2004 - 03/08/2004 Chemotherapy   She received single agent rituximab with no benefit.   03/27/2004 - 09/16/2004 Chemotherapy   She received 6 cycles of chemotherapy with RCVP with stable disease   01/20/2012 Imaging   Repeat CT scan showed new onset of left hydronephrosis and recurrence of soft tissue mass   02/08/2012 Imaging   That CT scan confirmed abnormalities   02/17/2012 Procedure   Repeat biopsy confirmed recurrent lymphoplasmacytic lymphoma   03/04/2012 - 07/14/2012 Chemotherapy   She received 6 more cycles of RCVP treatment   07/31/2012 Imaging   Repeat PET scan showed improved disease control   11/03/2012 Imaging   CT scan showed stable soft  tissue mass with no recurrence   09/08/2013 Imaging   Repeat CT scan showed no evidence of recurrence   11/07/2013 - 11/17/2013 Hospital Admission   She was admitted to the hospital with anemia and thrombocytopenia related to UTI, resolved. She also has acute on chronic renal failure   11/12/2013 Imaging   CT scan show no evidence of disease recurrence     PHYSICAL EXAMINATION: ECOG PERFORMANCE STATUS: 0 - Asymptomatic  Vitals:   03/11/21 1210  BP: (!) 168/63  Pulse: 70  Resp: 18  Temp: 98 F (36.7 C)  SpO2: 98%   Filed Weights   03/11/21 1210  Weight: 86 lb 9.6 oz (39.3 kg)    GENERAL:alert, no distress and comfortable.  She looks thin and cachectic SKIN: She has inflamed seborrheic keratosis on her shin EYES: normal, Conjunctiva are pink and non-injected, sclera clear OROPHARYNX:no exudate, no erythema and lips, buccal mucosa, and tongue normal  NECK: supple, thyroid normal size, non-tender, without nodularity LYMPH:  no palpable lymphadenopathy in the cervical, axillary or inguinal LUNGS: clear to auscultation and percussion with normal breathing effort HEART: regular rate & rhythm and no murmurs and no lower extremity edema ABDOMEN:abdomen soft, non-tender and normal bowel sounds.  Well-healed surgical scar.  The palpable nodularity could be just  thickening from scar Musculoskeletal:no cyanosis of digits and no clubbing  NEURO: alert & oriented x 3 with fluent speech, no focal motor/sensory deficits  LABORATORY DATA:  I have reviewed the data as listed    Component Value Date/Time   NA 140 03/11/2021 1157   NA 142 08/12/2017 1204   NA 139 02/23/2017 1114   K 4.1 03/11/2021 1157   K 4.0 02/23/2017 1114   CL 102 03/11/2021 1157   CO2 27 03/11/2021 1157   CO2 29 02/23/2017 1114   GLUCOSE 78 03/11/2021 1157   GLUCOSE 85 02/23/2017 1114   BUN 18 03/11/2021 1157   BUN 14 08/12/2017 1204   BUN 19.2 02/23/2017 1114   CREATININE 0.88 03/11/2021 1157   CREATININE 0.8  02/23/2017 1114   CALCIUM 10.7 (H) 03/11/2021 1157   CALCIUM 10.9 (H) 02/23/2017 1114   PROT 7.0 03/11/2021 1157   PROT 6.6 02/23/2017 1114   ALBUMIN 3.7 03/11/2021 1157   ALBUMIN 3.6 02/23/2017 1114   AST 19 03/11/2021 1157   AST 18 02/23/2017 1114   ALT 13 03/11/2021 1157   ALT 11 02/23/2017 1114   ALKPHOS 86 03/11/2021 1157   ALKPHOS 82 02/23/2017 1114   BILITOT 0.4 03/11/2021 1157   BILITOT 0.47 02/23/2017 1114   GFRNONAA >60 03/11/2021 1157   GFRNONAA 70 04/30/2014 1103   GFRAA 56 (L) 03/24/2019 1845   GFRAA 81 04/30/2014 1103    No results found for: SPEP, UPEP  Lab Results  Component Value Date   WBC 6.0 03/11/2021   NEUTROABS 3.6 03/11/2021   HGB 12.6 03/11/2021   HCT 39.5 03/11/2021   MCV 98.0 03/11/2021   PLT 363 03/11/2021      Chemistry      Component Value Date/Time   NA 140 03/11/2021 1157   NA 142 08/12/2017 1204   NA 139 02/23/2017 1114   K 4.1 03/11/2021 1157   K 4.0 02/23/2017 1114   CL 102 03/11/2021 1157   CO2 27 03/11/2021 1157   CO2 29 02/23/2017 1114   BUN 18 03/11/2021 1157   BUN 14 08/12/2017 1204   BUN 19.2 02/23/2017 1114   CREATININE 0.88 03/11/2021 1157   CREATININE 0.8 02/23/2017 1114      Component Value Date/Time   CALCIUM 10.7 (H) 03/11/2021 1157   CALCIUM 10.9 (H) 02/23/2017 1114   ALKPHOS 86 03/11/2021 1157   ALKPHOS 82 02/23/2017 1114   AST 19 03/11/2021 1157   AST 18 02/23/2017 1114   ALT 13 03/11/2021 1157   ALT 11 02/23/2017 1114   BILITOT 0.4 03/11/2021 1157   BILITOT 0.47 02/23/2017 1114

## 2021-03-12 NOTE — Assessment & Plan Note (Addendum)
She looks thin and cachectic According to the patient, her thyroid function test was within normal range She stated she has good appetite I cannot explain her profound weight loss and recommend CT imaging We discussed risk and benefits of appetite stimulant but she did not think she needs it

## 2021-03-12 NOTE — Assessment & Plan Note (Signed)
Elevated blood pressure today could be due to anxiety Observe for now

## 2021-03-12 NOTE — Assessment & Plan Note (Signed)
She has inflamed seborrheic keratosis on her leg She is reassured this is a benign condition

## 2021-03-19 ENCOUNTER — Other Ambulatory Visit: Payer: Self-pay

## 2021-03-19 ENCOUNTER — Ambulatory Visit (HOSPITAL_COMMUNITY)
Admission: RE | Admit: 2021-03-19 | Discharge: 2021-03-19 | Disposition: A | Discharge status: Home / Self Care | Payer: Medicare PPO | Source: Ambulatory Visit | Attending: Hematology and Oncology | Admitting: Hematology and Oncology

## 2021-03-19 DIAGNOSIS — Z8572 Personal history of non-Hodgkin lymphomas: Secondary | ICD-10-CM | POA: Insufficient documentation

## 2021-03-19 DIAGNOSIS — M47816 Spondylosis without myelopathy or radiculopathy, lumbar region: Secondary | ICD-10-CM | POA: Diagnosis not present

## 2021-03-19 DIAGNOSIS — N1330 Unspecified hydronephrosis: Secondary | ICD-10-CM | POA: Diagnosis not present

## 2021-03-19 DIAGNOSIS — K8020 Calculus of gallbladder without cholecystitis without obstruction: Secondary | ICD-10-CM | POA: Diagnosis not present

## 2021-03-19 DIAGNOSIS — I2510 Atherosclerotic heart disease of native coronary artery without angina pectoris: Secondary | ICD-10-CM | POA: Diagnosis not present

## 2021-03-19 DIAGNOSIS — R634 Abnormal weight loss: Secondary | ICD-10-CM | POA: Insufficient documentation

## 2021-03-19 DIAGNOSIS — I700 Atherosclerosis of aorta: Secondary | ICD-10-CM | POA: Diagnosis not present

## 2021-03-19 DIAGNOSIS — K7689 Other specified diseases of liver: Secondary | ICD-10-CM | POA: Diagnosis not present

## 2021-03-19 MED ORDER — IOHEXOL 350 MG/ML SOLN
80.0000 mL | Freq: Once | INTRAVENOUS | Status: AC | PRN
  Administered 2021-03-19: 60 mL via INTRAVENOUS

## 2021-03-21 ENCOUNTER — Other Ambulatory Visit: Payer: Self-pay

## 2021-03-21 ENCOUNTER — Encounter: Payer: Self-pay | Admitting: Hematology and Oncology

## 2021-03-21 ENCOUNTER — Inpatient Hospital Stay (HOSPITAL_BASED_OUTPATIENT_CLINIC_OR_DEPARTMENT_OTHER): Payer: Medicare PPO | Admitting: Hematology and Oncology

## 2021-03-21 DIAGNOSIS — Z8572 Personal history of non-Hodgkin lymphomas: Secondary | ICD-10-CM

## 2021-03-21 DIAGNOSIS — Z7982 Long term (current) use of aspirin: Secondary | ICD-10-CM | POA: Diagnosis not present

## 2021-03-21 DIAGNOSIS — R634 Abnormal weight loss: Secondary | ICD-10-CM | POA: Diagnosis not present

## 2021-03-21 DIAGNOSIS — Z79899 Other long term (current) drug therapy: Secondary | ICD-10-CM | POA: Diagnosis not present

## 2021-03-21 DIAGNOSIS — I10 Essential (primary) hypertension: Secondary | ICD-10-CM | POA: Diagnosis not present

## 2021-03-21 DIAGNOSIS — Z9221 Personal history of antineoplastic chemotherapy: Secondary | ICD-10-CM | POA: Diagnosis not present

## 2021-03-21 NOTE — Assessment & Plan Note (Signed)
I have reviewed multiple CT imaging with the patient The abnormal retroperitoneal lymphadenopathy is related to previously treated disease There is no signs of recurrence Her last chemotherapy was more than 5 years ago The patient is comfortable follow-up with primary care doctor only She is aware I will be around if she felt that I need to see her again Her weight loss is not related to disease recurrence

## 2021-03-21 NOTE — Assessment & Plan Note (Signed)
She is gained 2 pounds since last time I saw her I told her that her recent weight loss is unrelated We discussed conservative strategies to gain some weight such as increased physical activity and frequent small meals She is reassured that this is not due to cancer recurrence

## 2021-03-21 NOTE — Progress Notes (Signed)
[facility] OFFICE PROGRESS NOTE  Patient Care Team: Josetta Huddle, MD as PCP - General (Internal Medicine) Heath Lark, MD as Consulting Physician (Hematology and Oncology)  ASSESSMENT & PLAN:  History of non-Hodgkin's lymphoma I have reviewed multiple CT imaging with the patient The abnormal retroperitoneal lymphadenopathy is related to previously treated disease There is no signs of recurrence Her last chemotherapy was more than 5 years ago The patient is comfortable follow-up with primary care doctor only She is aware I will be around if she felt that I need to see her again Her weight loss is not related to disease recurrence  Essential hypertension Elevated blood pressure today could be due to anxiety Observe for now  Weight loss, non-intentional She is gained 2 pounds since last time I saw her I told her that her recent weight loss is unrelated We discussed conservative strategies to gain some weight such as increased physical activity and frequent small meals She is reassured that this is not due to cancer recurrence  No orders of the defined types were placed in this encounter.   All questions were answered. The patient knows to call the clinic with any problems, questions or concerns. The total time spent in the appointment was 20 minutes encounter with patients including review of chart and various tests results, discussions about plan of care and coordination of care plan   Heath Lark, MD 03/21/2021 12:17 PM  INTERVAL HISTORY: Please see below for problem oriented charting. she returns for review of recent CT imaging that was performed to evaluate for weight loss, on background history of lymphoma She gained 2 pound since last time I saw her She denies symptoms of uncontrolled hypertension such as headache, blurriness of vision  REVIEW OF SYSTEMS:   Constitutional: Denies fevers, chills or abnormal weight loss Eyes: Denies blurriness of  vision Ears, nose, mouth, throat, and face: Denies mucositis or sore throat Respiratory: Denies cough, dyspnea or wheezes Cardiovascular: Denies palpitation, chest discomfort or lower extremity swelling Gastrointestinal:  Denies nausea, heartburn or change in bowel habits Skin: Denies abnormal skin rashes Lymphatics: Denies new lymphadenopathy or easy bruising Neurological:Denies numbness, tingling or new weaknesses Behavioral/Psych: Mood is stable, no new changes  All other systems were reviewed with the patient and are negative.  I have reviewed the past medical history, past surgical history, social history and family history with the patient and they are unchanged from previous note.  ALLERGIES:  is allergic to oxycodone, sulfa antibiotics, tramadol, ezetimibe, hydrocodone-acetaminophen, lisinopril, nitrofurantoin, other, oxycodone hcl, sulfonamide derivatives, and torsemide.  MEDICATIONS:  Current Outpatient Medications  Medication Sig Dispense Refill   aspirin EC 81 MG tablet Take 1 tablet (81 mg total) by mouth daily. 90 tablet 3   diltiazem (DILTIAZEM CD) 180 MG 24 hr capsule Take 180 mg by mouth daily.     levothyroxine (SYNTHROID, LEVOTHROID) 25 MCG tablet Take 25 mcg by mouth daily.   3   losartan (COZAAR) 25 MG tablet TAKE 1 TABLET(25 MG) BY MOUTH DAILY (Patient taking differently: TAKE 1 TABLET(25 MG) BY MOUTH 3 X A WEEK) 90 tablet 3   Multiple Vitamin (MULTIVITAMIN WITH MINERALS) TABS tablet Take 1 tablet by mouth daily.     Multiple Vitamins-Minerals (PRESERVISION/LUTEIN PO) Take by mouth.     Polyvinyl Alcohol-Povidone (REFRESH OP) Place 1 drop into both eyes daily as needed (For dry eyes).     sennosides-docusate sodium (SENOKOT-S) 8.6-50 MG tablet Take 1 tablet by mouth 2 (two) times daily.  spironolactone (ALDACTONE) 50 MG tablet Take 50 mg by mouth daily.     Vitamin D, Cholecalciferol, 25 MCG (1000 UT) CAPS Take 1 capsule by mouth daily.     No current  facility-administered medications for this visit.    SUMMARY OF ONCOLOGIC HISTORY: Oncology History Overview Note  LYMPHOMA, lymphoplasmacytic    Primary site: Lymphoid Neoplasms   Staging method: AJCC 6th Edition   Clinical: Stage III signed by Heath Lark, MD on 11/28/2013  9:06 PM   Summary: Stage III A Initial IPI score of 2 Diagnosed elsewhere, CD 20 positive   History of non-Hodgkin's lymphoma  05/31/1995 Imaging   Date approximate. Ct imaging for hematuria showed large retroperitoneal mass with left ureter involvment   06/01/1995 Surgery   She had laparotomy and bilateral salpingo-oophorectomy and biopsy to be proven to be lymphoma.   06/07/1995 - 11/04/1995 Chemotherapy   She completed 6 cycles of CHOP plus etoposide chemotherapy   01/04/2004 Imaging   Imaging showed recurrence, confirmed on PET/CT scan.   01/07/2004 Procedure   Repeat biopsy confirmed CD20 positive lymphoma   01/14/2004 - 03/08/2004 Chemotherapy   She received single agent rituximab with no benefit.   03/27/2004 - 09/16/2004 Chemotherapy   She received 6 cycles of chemotherapy with RCVP with stable disease   01/20/2012 Imaging   Repeat CT scan showed new onset of left hydronephrosis and recurrence of soft tissue mass   02/08/2012 Imaging   That CT scan confirmed abnormalities   02/17/2012 Procedure   Repeat biopsy confirmed recurrent lymphoplasmacytic lymphoma   03/04/2012 - 07/14/2012 Chemotherapy   She received 6 more cycles of RCVP treatment   07/31/2012 Imaging   Repeat PET scan showed improved disease control   11/03/2012 Imaging   CT scan showed stable soft tissue mass with no recurrence   09/08/2013 Imaging   Repeat CT scan showed no evidence of recurrence   11/07/2013 - 11/17/2013 Hospital Admission   She was admitted to the hospital with anemia and thrombocytopenia related to UTI, resolved. She also has acute on chronic renal failure   11/12/2013 Imaging   CT scan show no evidence of disease  recurrence   03/19/2021 Imaging   No evidence of recurrent lymphoma.   Stable soft tissue nodule in the left upper retroperitoneum, likely treated lymphoma.   Mild bilateral hydronephrosis with associated left urothelial thickening, unchanged compared to prior.   Aortic Atherosclerosis (ICD10-I70.0).     PHYSICAL EXAMINATION: ECOG PERFORMANCE STATUS: 0 - Asymptomatic  Vitals:   03/21/21 1146  BP: (!) 176/76  Pulse: 84  Resp: 18  Temp: 98.4 F (36.9 C)  SpO2: 98%   Filed Weights   03/21/21 1146  Weight: 89 lb (40.4 kg)    GENERAL:alert, no distress and comfortable NEURO: alert & oriented x 3 with fluent speech, no focal motor/sensory deficits  LABORATORY DATA:  I have reviewed the data as listed    Component Value Date/Time   NA 140 03/11/2021 1157   NA 142 08/12/2017 1204   NA 139 02/23/2017 1114   K 4.1 03/11/2021 1157   K 4.0 02/23/2017 1114   CL 102 03/11/2021 1157   CO2 27 03/11/2021 1157   CO2 29 02/23/2017 1114   GLUCOSE 78 03/11/2021 1157   GLUCOSE 85 02/23/2017 1114   BUN 18 03/11/2021 1157   BUN 14 08/12/2017 1204   BUN 19.2 02/23/2017 1114   CREATININE 0.88 03/11/2021 1157   CREATININE 0.8 02/23/2017 1114   CALCIUM 10.7 (H) 03/11/2021  1157   CALCIUM 10.9 (H) 02/23/2017 1114   PROT 7.0 03/11/2021 1157   PROT 6.6 02/23/2017 1114   ALBUMIN 3.7 03/11/2021 1157   ALBUMIN 3.6 02/23/2017 1114   AST 19 03/11/2021 1157   AST 18 02/23/2017 1114   ALT 13 03/11/2021 1157   ALT 11 02/23/2017 1114   ALKPHOS 86 03/11/2021 1157   ALKPHOS 82 02/23/2017 1114   BILITOT 0.4 03/11/2021 1157   BILITOT 0.47 02/23/2017 1114   GFRNONAA >60 03/11/2021 1157   GFRNONAA 70 04/30/2014 1103   GFRAA 56 (L) 03/24/2019 1845   GFRAA 81 04/30/2014 1103    No results found for: SPEP, UPEP  Lab Results  Component Value Date   WBC 6.0 03/11/2021   NEUTROABS 3.6 03/11/2021   HGB 12.6 03/11/2021   HCT 39.5 03/11/2021   MCV 98.0 03/11/2021   PLT 363 03/11/2021       Chemistry      Component Value Date/Time   NA 140 03/11/2021 1157   NA 142 08/12/2017 1204   NA 139 02/23/2017 1114   K 4.1 03/11/2021 1157   K 4.0 02/23/2017 1114   CL 102 03/11/2021 1157   CO2 27 03/11/2021 1157   CO2 29 02/23/2017 1114   BUN 18 03/11/2021 1157   BUN 14 08/12/2017 1204   BUN 19.2 02/23/2017 1114   CREATININE 0.88 03/11/2021 1157   CREATININE 0.8 02/23/2017 1114      Component Value Date/Time   CALCIUM 10.7 (H) 03/11/2021 1157   CALCIUM 10.9 (H) 02/23/2017 1114   ALKPHOS 86 03/11/2021 1157   ALKPHOS 82 02/23/2017 1114   AST 19 03/11/2021 1157   AST 18 02/23/2017 1114   ALT 13 03/11/2021 1157   ALT 11 02/23/2017 1114   BILITOT 0.4 03/11/2021 1157   BILITOT 0.47 02/23/2017 1114       RADIOGRAPHIC STUDIES: I have reviewed multiple imaging studies with the patient I have personally reviewed the radiological images as listed and agreed with the findings in the report. CT CHEST ABDOMEN PELVIS W CONTRAST  Result Date: 03/19/2021 CLINICAL DATA:  History of non-Hodgkin's lymphoma EXAM: CT CHEST, ABDOMEN, AND PELVIS WITH CONTRAST TECHNIQUE: Multidetector CT imaging of the chest, abdomen and pelvis was performed following the standard protocol during bolus administration of intravenous contrast. CONTRAST:  68m OMNIPAQUE IOHEXOL 350 MG/ML SOLN COMPARISON:  CT abdomen and pelvis dated December 12, 2019 FINDINGS: CT CHEST FINDINGS Cardiovascular: Cardiomegaly. No pericardial effusion. Three-vessel coronary artery calcifications. Mitral annular calcifications. Atherosclerotic disease of the thoracic aorta. Mediastinum/Nodes: No pathologically large lymph nodes seen in the chest. Patulous esophagus. Lungs/Pleura: Central airways are patent. No consolidation, pleural effusion or pneumothorax no suspicious pulmonary nodules. Musculoskeletal: Diffuse demineralization. No aggressive appearing osseous lesions. CT ABDOMEN PELVIS FINDINGS Hepatobiliary: Unchanged scattered low-attenuation  lesions of the liver which are likely simple cysts. Cholelithiasis. No evidence of acute cholecystitis. No biliary ductal dilation. Pancreas: Unremarkable. No pancreatic ductal dilatation or surrounding inflammatory changes. Spleen: Normal in size without focal abnormality. Adrenals/Urinary Tract: Unchanged thickening of the left adrenal gland. Right adrenal gland is unremarkable.Kidneys enhance symmetrically bilateral low-attenuation renal lesions, largest are compatible with simple cysts, others are too small to completely characterize. Unchanged mild bilateral hydronephrosis with associated left urothelial thickening. Stomach/Bowel: Stomach is unremarkable. Appendix is not visualized. Numerous diverticula most pronounced at the sigmoid colon. No inflammatory change, wall thickening, or evidence of obstruction. Vascular/Lymphatic: Aortic atherosclerosis. No enlarged abdominal or pelvic lymph nodes. Reproductive: Status post hysterectomy. No adnexal masses. Other:  Soft tissue nodule in the left upper retroperitoneum is unchanged in size compared to prior exam, measuring approximately 2.4 x 2.0 cm on series 2, image 60. Abdominal wall hernia mesh. No abdominopelvic ascites. Musculoskeletal: Diffuse demineralization. Degenerative changes of the lumbar spine. No aggressive appearing osseous lesions. IMPRESSION: No evidence of recurrent lymphoma. Stable soft tissue nodule in the left upper retroperitoneum, likely treated lymphoma. Mild bilateral hydronephrosis with associated left urothelial thickening, unchanged compared to prior. Aortic Atherosclerosis (ICD10-I70.0). Electronically Signed   By: Yetta Glassman M.D.   On: 03/19/2021 19:44

## 2021-03-21 NOTE — Assessment & Plan Note (Signed)
Elevated blood pressure today could be due to anxiety Observe for now

## 2021-03-27 DIAGNOSIS — I10 Essential (primary) hypertension: Secondary | ICD-10-CM | POA: Diagnosis not present

## 2021-03-27 DIAGNOSIS — R350 Frequency of micturition: Secondary | ICD-10-CM | POA: Diagnosis not present

## 2021-03-27 DIAGNOSIS — R42 Dizziness and giddiness: Secondary | ICD-10-CM | POA: Diagnosis not present

## 2021-05-05 DIAGNOSIS — I10 Essential (primary) hypertension: Secondary | ICD-10-CM | POA: Diagnosis not present

## 2021-05-22 DIAGNOSIS — Z03818 Encounter for observation for suspected exposure to other biological agents ruled out: Secondary | ICD-10-CM | POA: Diagnosis not present

## 2021-05-22 DIAGNOSIS — R051 Acute cough: Secondary | ICD-10-CM | POA: Diagnosis not present

## 2021-05-22 DIAGNOSIS — R0981 Nasal congestion: Secondary | ICD-10-CM | POA: Diagnosis not present

## 2021-06-04 DIAGNOSIS — I10 Essential (primary) hypertension: Secondary | ICD-10-CM | POA: Diagnosis not present

## 2021-07-31 DIAGNOSIS — R42 Dizziness and giddiness: Secondary | ICD-10-CM | POA: Diagnosis not present

## 2021-07-31 DIAGNOSIS — D649 Anemia, unspecified: Secondary | ICD-10-CM | POA: Diagnosis not present

## 2021-07-31 DIAGNOSIS — R002 Palpitations: Secondary | ICD-10-CM | POA: Diagnosis not present

## 2021-07-31 DIAGNOSIS — R350 Frequency of micturition: Secondary | ICD-10-CM | POA: Diagnosis not present

## 2021-08-07 DIAGNOSIS — D509 Iron deficiency anemia, unspecified: Secondary | ICD-10-CM | POA: Diagnosis not present

## 2021-08-14 DIAGNOSIS — D649 Anemia, unspecified: Secondary | ICD-10-CM | POA: Diagnosis not present

## 2021-08-29 DIAGNOSIS — N390 Urinary tract infection, site not specified: Secondary | ICD-10-CM | POA: Diagnosis not present

## 2021-09-05 DIAGNOSIS — N390 Urinary tract infection, site not specified: Secondary | ICD-10-CM | POA: Diagnosis not present

## 2021-09-10 ENCOUNTER — Ambulatory Visit: Payer: Medicare PPO | Admitting: Physician Assistant

## 2021-09-17 NOTE — Progress Notes (Signed)
? ? ? ?09/22/2021 ?MADESYN AST ?[id number] ?[birth year] ? ? ?ASSESSMENT AND PLAN:  ?Anemia, unspecified type ?Normocytic anemia with normal iron, ferritin, low percent saturation. ?Was having some reflux intermittently prior, started on omeprazole. ?Has been on iron every other day for at least a month. ?We will recheck labs including folate and B12 with normocytic anemia. ?Likely will not pursue any endoscopic evaluation at this time due to age, comorbidities, no gross bleeding.  We will repeat anemia labs, follow-up 2 months, followed closely. ?If any overt signs of bleeding, iron continues to drop, her anemia continues to decrease despite medical management, can consider endoscopic evaluation in the hospital. ?-     CBC with Differential/Platelet; Future ?-     Comprehensive metabolic panel; Future ?-     Iron and TIBC; Future ?-     Ferritin; Future ?-     Folate; Future ?-     H. pylori antibody, IgG; Future ? ?Positive fecal occult blood test ?Unsurprising positive fecal occult blood and 88 year old with history of internal hemorrhoids and diverticulosis. ?No gross blood seen. ? ?Diverticulosis of colon without hemorrhage ?Will call if any symptoms. ?Add on fiber supplement, avoid NSAIDS, information given ? ?Internal hemorrhoid ?Declined rectal exam today, possibility could cause positive fecal occult bloods. ? ?B12 deficiency ?-     Vitamin B12; Future ? ?Chronic systolic congestive heart failure ([place]) ?2019 ejection fraction 30 to 35% ?Appears euvolemic today, well controlled medications. ? ?UNSPECIFIED PERIPHERAL VASCULAR DISEASE ?Continue exercise, not on blood thinners at this time. ? ?History of non-Hodgkin's lymphoma ?03/19/2021 CT chest abdomen pelvis with contrast for history of non-Hodgkin's lymphoma showed no evidence of recurrent lymphoma stable soft tissue nodule left upper retroperitoneum likely treated lymphoma, bilateral hydronephrosis and aortic atherosclerosis.  Normal stomach, numerous  diverticula, normal pancreas, cholelithiasis without evidence of cholecystitis, normal biliary ducts. ? ?Patient Care Team: ?Josetta Huddle, MD as PCP - General (Internal Medicine) ?Heath Lark, MD as Consulting Physician (Hematology and Oncology) ? ?HISTORY OF PRESENT ILLNESS: ?88 y.o. female referred by Kathalene Frames, *, with a past medical history of peripheral vascular disease, hypertension, hyperlipidemia, history of non-Hodgkin's lymphoma 2015 stage III, cardiomyopathy 09/2017 EF 30 to 35%, diverticulosis, constipation, reflux and others listed below presents for evaluation of blood in stool and anemia, patient is not on a blood thinner. ?Patient previously known to Dr. Deatra Ina. ?Was seen in the hospital 2015 by Dr. Hilarie Fredrickson for GI bleed. ? ?11/16/2013 colonoscopy for GI bleed showed numerous diverticulum throughout the colon, internal hemorrhoids medium size otherwise normal.   ?03/2006 normal colonoscopy other than diverticulosis and hemorrhoids. ?03/19/2021 CT chest abdomen pelvis with contrast for history of non-Hodgkin's lymphoma showed no evidence of recurrent lymphoma stable soft tissue nodule left upper retroperitoneum likely treated lymphoma, bilateral hydronephrosis and aortic atherosclerosis.  Normal stomach, numerous diverticula, normal pancreas, cholelithiasis without evidence of cholecystitis, normal biliary ducts. ? ?Patient found to have normocytic anemia 07/31/2021, iron was normal at 53, ferritin 45, percent saturation slightly low at 16.   ?Patient did have 3 Hemoccult positive. ?Started on omeprazole and iron . ? ?Patient was having lightheaded/unstable which prompted the labs, was happening intermittent, this has improved and rarely happening.  ?Declines seeing blood in her stool.  ?Has been on iron every other day for a month.  ?Denies nausea, vomiting.  ?She was having intermittent GERD with certain foods prior to this, has been on the omeprazole once in the morning, taking with her  thyroid medication.  ?She has  not had any more GERD.  ?Denies dysphagia ?No melena, hematochezia, no AB pain.  ? ?She had a great birthday week, spent with all of her family. Has 3 grand kids and 4 great grand kids, was able to see almost every one.  ? ?External labs and notes reviewed this visit: ?08/07/2021 HGB 9.9, MCV 97.2, platelets 461,  ?08/14/2021 hemoglobin 10.5, MCV 98.5, platelets 460.   ?Hemoccult positive x3  ?07/31/2021 iron 53, percent saturation slightly low at 16, ferritin 45 ?BUN 41, CR 0.83.  ?GFR 66 ? ? ?Current Medications:  ? ?Current Outpatient Medications (Endocrine & Metabolic):  ?  levothyroxine (SYNTHROID, LEVOTHROID) 25 MCG tablet, Take 25 mcg by mouth daily.  ? ?Current Outpatient Medications (Cardiovascular):  ?  diltiazem (CARDIZEM CD) 180 MG 24 hr capsule, Take 180 mg by mouth daily. ?  spironolactone (ALDACTONE) 50 MG tablet, Take 50 mg by mouth daily. ? ? ? ?Current Outpatient Medications (Hematological):  ?  ferrous sulfate 325 (65 FE) MG tablet, Take 325 mg by mouth every 3 (three) days. ? ?Current Outpatient Medications (Other):  ?  Multiple Vitamin (MULTIVITAMIN WITH MINERALS) TABS tablet, Take 1 tablet by mouth daily. ?  Polyvinyl Alcohol-Povidone (REFRESH OP), Place 1 drop into both eyes daily as needed (For dry eyes). ?  sennosides-docusate sodium (SENOKOT-S) 8.6-50 MG tablet, Take 1 tablet by mouth 2 (two) times daily. ?  Vitamin D, Cholecalciferol, 25 MCG (1000 UT) CAPS, Take 1 capsule by mouth daily. ?  omeprazole (PRILOSEC) 40 MG capsule, Take 40 mg by mouth. ? ?Medical History:  ?Past Medical History:  ?Diagnosis Date  ? Cancer [facility])   ? recurrent lymphoma  ? Cancer [facility])   ? carcinoid cancer per outside record  ? CARDIOMYOPATHY   ? DIVERTICULITIS, COLON   ? GERD (gastroesophageal reflux disease)   ? H/O: hysterectomy 07/07/1975  ? Hemorrhoids, internal   ? HYPERLIPIDEMIA   ? HYPERTENSION   ? Hypothyroidism   ? IDA (iron deficiency anemia)   ? LEFT BUNDLE BRANCH BLOCK   ?  LYMPHOMA   ? Maintenance chemotherapy   ? Skin cancer   ? melanoma diagnosed elsewhere 1999  ? Vitamin D deficiency   ? ?Allergies:  ?Allergies  ?Allergen Reactions  ? Oxycodone Itching  ? Sulfa Antibiotics Other (See Comments)  ? Tramadol Itching  ? Ezetimibe Other (See Comments)  ? Hydrocodone-Acetaminophen Other (See Comments)  ? Lisinopril Rash  ? Nitrofurantoin Other (See Comments)  ? Other Itching and Rash  ?  "chemo" medication given at [facility] in [place], [place]  ? Oxycodone Hcl Other (See Comments)  ? Sulfonamide Derivatives Other (See Comments)  ? Torsemide Itching and Rash  ?  ? ?Surgical History:  ?She  has a past surgical history that includes Appendectomy (1949); Toe Surgery; Lymphoma biopsies; and Colonoscopy with propofol (N/A, 11/16/2013). ?Family History:  ?Her family history includes Lymphoma in her mother; Sudden death in her father. ?Social History:  ? reports that she has quit smoking. She has never used smokeless tobacco. She reports that she does not drink alcohol and does not use drugs. ? ?REVIEW OF SYSTEMS  : All other systems reviewed and negative except where noted in the History of Present Illness. ? ? ?PHYSICAL EXAM: ?BP 120/68   Pulse 75   Ht 5' (1.524 m)   Wt 88 lb (39.9 kg)   SpO2 98%   BMI 17.19 kg/m?  ?General:   Pleasant, well developed female, younger appearing than stated age in no  acute distress ?Head:  Normocephalic and atraumatic. ?Eyes: sclerae anicteric,conjunctive pink  ?Heart:  regular rate and rhythm ?Pulm: Mild posterior lower lobe wheezing. ?Abdomen:  Soft, Flat AB, skin exam , Normal bowel sounds.  no  tenderness . Without guarding and Without rebound, without hepatomegaly. ?Extremities:  Without edema. ?Msk:  Symmetrical without gross deformities. Peripheral pulses intact.  ?Neurologic:  Alert and  oriented x4;  grossly normal neurologically. ?Skin:   Dry and intact without significant lesions or rashes. ?Psychiatric: Demonstrates good judgement and  reason without abnormal affect or behaviors.  Well put together. ? ? ?Vladimir Crofts, PA-C ?11:58 AM ? ? ?

## 2021-09-22 ENCOUNTER — Ambulatory Visit: Payer: Medicare PPO | Admitting: Physician Assistant

## 2021-09-22 ENCOUNTER — Encounter: Payer: Self-pay | Admitting: Physician Assistant

## 2021-09-22 ENCOUNTER — Other Ambulatory Visit (INDEPENDENT_AMBULATORY_CARE_PROVIDER_SITE_OTHER): Payer: Medicare PPO

## 2021-09-22 VITALS — BP 120/68 | HR 75 | Ht 60.0 in | Wt 88.0 lb

## 2021-09-22 DIAGNOSIS — K5730 Diverticulosis of large intestine without perforation or abscess without bleeding: Secondary | ICD-10-CM

## 2021-09-22 DIAGNOSIS — D649 Anemia, unspecified: Secondary | ICD-10-CM

## 2021-09-22 DIAGNOSIS — K648 Other hemorrhoids: Secondary | ICD-10-CM

## 2021-09-22 DIAGNOSIS — E538 Deficiency of other specified B group vitamins: Secondary | ICD-10-CM

## 2021-09-22 DIAGNOSIS — Z8572 Personal history of non-Hodgkin lymphomas: Secondary | ICD-10-CM | POA: Diagnosis not present

## 2021-09-22 DIAGNOSIS — I5022 Chronic systolic (congestive) heart failure: Secondary | ICD-10-CM

## 2021-09-22 DIAGNOSIS — I739 Peripheral vascular disease, unspecified: Secondary | ICD-10-CM | POA: Diagnosis not present

## 2021-09-22 DIAGNOSIS — R195 Other fecal abnormalities: Secondary | ICD-10-CM

## 2021-09-22 LAB — CBC WITH DIFFERENTIAL/PLATELET
Basophils Absolute: 0.1 10*3/uL (ref 0.0–0.1)
Basophils Relative: 1.2 % (ref 0.0–3.0)
Eosinophils Absolute: 0 10*3/uL (ref 0.0–0.7)
Eosinophils Relative: 1 % (ref 0.0–5.0)
HCT: 36.5 % (ref 36.0–46.0)
Hemoglobin: 12.2 g/dL (ref 12.0–15.0)
Lymphocytes Relative: 26.9 % (ref 12.0–46.0)
Lymphs Abs: 1.2 10*3/uL (ref 0.7–4.0)
MCHC: 33.5 g/dL (ref 30.0–36.0)
MCV: 95.5 fl (ref 78.0–100.0)
Monocytes Absolute: 0.4 10*3/uL (ref 0.1–1.0)
Monocytes Relative: 8.8 % (ref 3.0–12.0)
Neutro Abs: 2.8 10*3/uL (ref 1.4–7.7)
Neutrophils Relative %: 62.1 % (ref 43.0–77.0)
Platelets: 396 10*3/uL (ref 150.0–400.0)
RBC: 3.82 Mil/uL — ABNORMAL LOW (ref 3.87–5.11)
RDW: 14.9 % (ref 11.5–15.5)
WBC: 4.5 10*3/uL (ref 4.0–10.5)

## 2021-09-22 LAB — COMPREHENSIVE METABOLIC PANEL
ALT: 18 U/L (ref 0–35)
AST: 24 U/L (ref 0–37)
Albumin: 3.9 g/dL (ref 3.5–5.2)
Alkaline Phosphatase: 99 U/L (ref 39–117)
BUN: 18 mg/dL (ref 6–23)
CO2: 25 mEq/L (ref 19–32)
Calcium: 9.6 mg/dL (ref 8.4–10.5)
Chloride: 103 mEq/L (ref 96–112)
Creatinine, Ser: 0.88 mg/dL (ref 0.40–1.20)
GFR: 57.21 mL/min — ABNORMAL LOW (ref >60.00)
Glucose, Bld: 84 mg/dL (ref 70–99)
Potassium: 4.2 mEq/L (ref 3.5–5.1)
Sodium: 138 mEq/L (ref 135–145)
Total Bilirubin: 0.4 mg/dL (ref 0.2–1.2)
Total Protein: 6.8 g/dL (ref 6.0–8.3)

## 2021-09-22 LAB — VITAMIN B12: Vitamin B-12: 332 pg/mL (ref 211–911)

## 2021-09-22 LAB — H. PYLORI ANTIBODY, IGG: H Pylori IgG: NEGATIVE

## 2021-09-22 LAB — FERRITIN: Ferritin: 23.3 ng/mL (ref 10.0–291.0)

## 2021-09-22 LAB — FOLATE: Folate: >24.2 ng/mL (ref >5.9)

## 2021-09-22 NOTE — Progress Notes (Signed)
Addendum: ?Reviewed and agree with assessment and management plan. ?I would recommend she follow-up with Dr. Alvy Bimler who she sees given her history of lymphoma.  Would continue oral iron.  She had staging CT chest abdomen pelvis which did not show any overt GI pathology in September 2022.  May consider repeat imaging but defer to Dr. Alvy Bimler. ?If overt bleeding we could consider endoscopic evaluation though this would have higher than average risk given age and her medical history. ?Abundio Teuscher, Lajuan Lines, MD ? ?

## 2021-09-22 NOTE — Patient Instructions (Addendum)
Your provider has requested that you go to the basement level for lab work before leaving today. Press "B" on the elevator. The lab is located at the first door on the left as you exit the elevator. ? ? ?Please take your proton pump inhibitor medication 30 minutes to 1 hour before meals- this makes it more effective.  ?CONTINUE ON THE OMEPRAZOLE ONCE DAILY- SUGGEST TAKING 4 HOURS AWAY FROM YOUR THYROID MEDICATION ?Avoid spicy and acidic foods ?Avoid fatty foods ?Limit your intake of coffee, tea, alcohol, and carbonated drinks ?Work to maintain a healthy weight ?Keep the head of the bed elevated at least 3 inches with blocks or a wedge pillow if you are having any nighttime symptoms ?Stay upright for 2 hours after eating ?Avoid meals and snacks three to four hours before bedtime ?Stop smoking ? ?Gastroesophageal Reflux Disease, Adult ?Gastroesophageal reflux (GER) happens when acid from the stomach flows up into the tube that connects the mouth and the stomach (esophagus). Normally, food travels down the esophagus and stays in the stomach to be digested. However, when a person has GER, food and stomach acid sometimes move back up into the esophagus. If this becomes a more serious problem, the person may be diagnosed with a disease called gastroesophageal reflux disease (GERD). GERD occurs when the reflux: ?Happens often. ?Causes frequent or severe symptoms. ?Causes problems such as damage to the esophagus. ?When stomach acid comes in contact with the esophagus, the acid may cause inflammation in the esophagus. Over time, GERD may create small holes (ulcers) in the lining of the esophagus. ?What are the causes? ?This condition is caused by a problem with the muscle between the esophagus and the stomach (lower esophageal sphincter, or LES). Normally, the LES muscle closes after food passes through the esophagus to the stomach. When the LES is weakened or abnormal, it does not close properly, and that allows food and  stomach acid to go back up into the esophagus. ?The LES can be weakened by certain dietary substances, medicines, and medical conditions, including: ?Tobacco use. ?Pregnancy. ?Having a hiatal hernia. ?Alcohol use. ?Certain foods and beverages, such as coffee, chocolate, onions, and peppermint. ?What increases the risk? ?You are more likely to develop this condition if you: ?Have an increased body weight. ?Have a connective tissue disorder. ?Take NSAIDs, such as ibuprofen. ?What are the signs or symptoms? ?Symptoms of this condition include: ?Heartburn. ?Difficult or painful swallowing and the feeling of having a lump in the throat. ?A bitter taste in the mouth. ?Bad breath and having a large amount of saliva. ?Having an upset or bloated stomach and belching. ?Chest pain. Different conditions can cause chest pain. Make sure you see your health care provider if you experience chest pain. ?Shortness of breath or wheezing. ?Ongoing (chronic) cough or a nighttime cough. ?Wearing away of tooth enamel. ?Weight loss. ?How is this diagnosed? ?This condition may be diagnosed based on a medical history and a physical exam. To determine if you have mild or severe GERD, your health care provider may also monitor how you respond to treatment. You may also have tests, including: ?A test to examine your stomach and esophagus with a small camera (endoscopy). ?A test that measures the acidity level in your esophagus. ?A test that measures how much pressure is on your esophagus. ?A barium swallow or modified barium swallow test to show the shape, size, and functioning of your esophagus. ?How is this treated? ?Treatment for this condition may vary depending on how severe  your symptoms are. Your health care provider may recommend: ?Changes to your diet. ?Medicine. ?Surgery. ?The goal of treatment is to help relieve your symptoms and to prevent complications. ?Follow these instructions at home: ?Eating and drinking ? ?Follow a diet as  recommended by your health care provider. This may involve avoiding foods and drinks such as: ?Coffee and tea, with or without caffeine. ?Drinks that contain alcohol. ?Energy drinks and sports drinks. ?Carbonated drinks or sodas. ?Chocolate and cocoa. ?Peppermint and mint flavorings. ?Garlic and onions. ?Horseradish. ?Spicy and acidic foods, including peppers, chili powder, curry powder, vinegar, hot sauces, and barbecue sauce. ?Citrus fruit juices and citrus fruits, such as oranges, lemons, and limes. ?Tomato-based foods, such as red sauce, chili, salsa, and pizza with red sauce. ?Fried and fatty foods, such as donuts, french fries, potato chips, and high-fat dressings. ?High-fat meats, such as hot dogs and fatty cuts of red and white meats, such as rib eye steak, sausage, ham, and bacon. ?High-fat dairy items, such as whole milk, butter, and cream cheese. ?Eat small, frequent meals instead of large meals. ?Avoid drinking large amounts of liquid with your meals. ?Avoid eating meals during the 2-3 hours before bedtime. ?Avoid lying down right after you eat. ?Do not exercise right after you eat. ?Lifestyle ? ?Do not use any products that contain nicotine or tobacco. These products include cigarettes, chewing tobacco, and vaping devices, such as e-cigarettes. If you need help quitting, ask your health care provider. ?Try to reduce your stress by using methods such as yoga or meditation. If you need help reducing stress, ask your health care provider. ?If you are overweight, reduce your weight to an amount that is healthy for you. Ask your health care provider for guidance about a safe weight loss goal. ?General instructions ?Pay attention to any changes in your symptoms. ?Take over-the-counter and prescription medicines only as told by your health care provider. Do not take aspirin, ibuprofen, or other NSAIDs unless your health care provider told you to take these medicines. ?Wear loose-fitting clothing. Do not wear  anything tight around your waist that causes pressure on your abdomen. ?Raise (elevate) the head of your bed about 6 inches (15 cm). You can use a wedge to do this. ?Avoid bending over if this makes your symptoms worse. ?Keep all follow-up visits. This is important. ?Contact a health care provider if: ?You have: ?New symptoms. ?Unexplained weight loss. ?Difficulty swallowing or it hurts to swallow. ?Wheezing or a persistent cough. ?A hoarse voice. ?Your symptoms do not improve with treatment. ?Get help right away if: ?You have sudden pain in your arms, neck, jaw, teeth, or back. ?You suddenly feel sweaty, dizzy, or light-headed. ?You have chest pain or shortness of breath. ?You vomit and the vomit is green, yellow, or black, or it looks like blood or coffee grounds. ?You faint. ?You have stool that is red, bloody, or black. ?You cannot swallow, drink, or eat. ?These symptoms may represent a serious problem that is an emergency. Do not wait to see if the symptoms will go away. Get medical help right away. Call your local emergency services (911 in the [place] Do not drive yourself to the hospital. ?Summary ?Gastroesophageal reflux happens when acid from the stomach flows up into the esophagus. GERD is a disease in which the reflux happens often, causes frequent or severe symptoms, or causes problems such as damage to the esophagus. ?Treatment for this condition may vary depending on how severe your symptoms are. Your health care provider  may recommend diet and lifestyle changes, medicine, or surgery. ?Contact a health care provider if you have new or worsening symptoms. ?Take over-the-counter and prescription medicines only as told by your health care provider. Do not take aspirin, ibuprofen, or other NSAIDs unless your health care provider told you to do so. ?Keep all follow-up visits as told by your health care provider. This is important. ?This information is not intended to replace advice given to you by your  health care provider. Make sure you discuss any questions you have with your health care provider. ?Document Revised: 01/01/2020 Document Reviewed: 01/01/2020 ?Elsevier Patient Education ? [place]. ?

## 2021-09-23 ENCOUNTER — Other Ambulatory Visit: Payer: Self-pay

## 2021-09-23 ENCOUNTER — Telehealth: Payer: Self-pay | Admitting: Physician Assistant

## 2021-09-23 DIAGNOSIS — D649 Anemia, unspecified: Secondary | ICD-10-CM

## 2021-09-23 LAB — IRON AND TIBC
Iron Saturation: 37 % (ref 15–55)
Iron: 107 ug/dL (ref 27–139)
Total Iron Binding Capacity: 292 ug/dL (ref 250–450)
UIBC: 185 ug/dL (ref 118–369)

## 2021-09-23 NOTE — Telephone Encounter (Signed)
Patient called wanting to know how often to take [facility]. Please advise ?

## 2021-09-23 NOTE — Telephone Encounter (Signed)
Informed patient that Vicie Mutters, PA wants her to take her omeprazole once daily before meals at least 4 hours apart from her thyroid medication. Patient verbalized understanding. ?

## 2021-11-20 ENCOUNTER — Encounter: Payer: Self-pay | Admitting: *Deleted

## 2021-11-24 ENCOUNTER — Ambulatory Visit: Payer: Medicare PPO | Admitting: Internal Medicine

## 2021-11-26 ENCOUNTER — Encounter: Payer: Self-pay | Admitting: Internal Medicine

## 2021-11-26 ENCOUNTER — Other Ambulatory Visit (INDEPENDENT_AMBULATORY_CARE_PROVIDER_SITE_OTHER): Payer: Medicare PPO

## 2021-11-26 ENCOUNTER — Ambulatory Visit: Payer: Medicare PPO | Admitting: Internal Medicine

## 2021-11-26 VITALS — BP 138/60 | HR 70 | Ht 60.0 in | Wt 88.0 lb

## 2021-11-26 DIAGNOSIS — R12 Heartburn: Secondary | ICD-10-CM

## 2021-11-26 DIAGNOSIS — D509 Iron deficiency anemia, unspecified: Secondary | ICD-10-CM | POA: Diagnosis not present

## 2021-11-26 DIAGNOSIS — K5909 Other constipation: Secondary | ICD-10-CM | POA: Diagnosis not present

## 2021-11-26 DIAGNOSIS — R195 Other fecal abnormalities: Secondary | ICD-10-CM

## 2021-11-26 LAB — CBC WITH DIFFERENTIAL/PLATELET
Basophils Absolute: 0 10*3/uL (ref 0.0–0.1)
Basophils Relative: 0.9 % (ref 0.0–3.0)
Eosinophils Absolute: 0.1 10*3/uL (ref 0.0–0.7)
Eosinophils Relative: 1.5 % (ref 0.0–5.0)
HCT: 38 % (ref 36.0–46.0)
Hemoglobin: 12.8 g/dL (ref 12.0–15.0)
Lymphocytes Relative: 30.2 % (ref 12.0–46.0)
Lymphs Abs: 1.6 10*3/uL (ref 0.7–4.0)
MCHC: 33.5 g/dL (ref 30.0–36.0)
MCV: 93.9 fl (ref 78.0–100.0)
Monocytes Absolute: 0.5 10*3/uL (ref 0.1–1.0)
Monocytes Relative: 8.9 % (ref 3.0–12.0)
Neutro Abs: 3 10*3/uL (ref 1.4–7.7)
Neutrophils Relative %: 58.5 % (ref 43.0–77.0)
Platelets: 373 10*3/uL (ref 150.0–400.0)
RBC: 4.05 Mil/uL (ref 3.87–5.11)
RDW: 15.6 % — ABNORMAL HIGH (ref 11.5–15.5)
WBC: 5.2 10*3/uL (ref 4.0–10.5)

## 2021-11-26 LAB — IBC + FERRITIN
Ferritin: 38.6 ng/mL (ref 10.0–291.0)
Iron: 95 ug/dL (ref 42–145)
Saturation Ratios: 28.9 % (ref 20.0–50.0)
TIBC: 329 ug/dL (ref 250.0–450.0)
Transferrin: 235 mg/dL (ref 212.0–360.0)

## 2021-11-26 NOTE — Progress Notes (Signed)
   Subjective:    Patient ID: Cindy Mccormick, female    DOB: Mar 17, 1928, 88 y.o.   MRN: [id number]  HPI Cindy Mccormick is a 88 year old female with a past medical history of diverticulosis with prior diverticular bleed, hemorrhoids, history of GERD, peripheral vascular disease, history of non-Hodgkin's lymphoma in 2013, cardiomyopathy, hypertension and hyperlipidemia who is here for follow-up.  She was seen in March for heme positive stool.  She reports she continues to feel well.  She has no GI complaint today.  No abdominal pain.  She does have intermittent heartburn but this really has resolved with some dietary modification.  She does not wish to completely avoid coffee or chocolate.  No dysphagia or odynophagia.  Bowel habits have been regular of late she takes senna 1 tablet in the morning 1 tablet in the evening.  She has had no black or tarry stool.  No blood in stool or rectal bleeding.  Appetite remains good.  She is taking oral iron 3 days/week.   Review of Systems As per HPI, otherwise negative  Current Medications, Allergies, Past Medical History, Past Surgical History, Family History and Social History were reviewed in [employer] record.     Objective:   Physical Exam BP 138/60   Pulse 70   Ht 5' (1.524 m)   Wt 88 lb (39.9 kg)   BMI 17.19 kg/m  Gen: awake, alert, NAD HEENT: anicteric CV: RRR, SEM Pulm: CTA b/l Abd: soft, NT/ND, +BS throughout Ext: no c/c/e Neuro: nonfocal     Latest Ref Rng & Units 11/26/2021   11:25 AM 09/22/2021   12:20 PM 03/11/2021   11:57 AM  CBC  WBC 4.0 - 10.5 K/uL 5.2   4.5   6.0    Hemoglobin 12.0 - 15.0 g/dL 12.8   12.2   12.6    Hematocrit 36.0 - 46.0 % 38.0   36.5   39.5    Platelets 150.0 - 400.0 K/uL 373.0   396.0   363     Iron/TIBC/Ferritin/ %Sat    Component Value Date/Time   IRON 95 11/26/2021 1125   IRON 107 09/22/2021 1220   TIBC 329.0 11/26/2021 1125   TIBC 292 09/22/2021 1220   FERRITIN 38.6  11/26/2021 1125   IRONPCTSAT 28.9 11/26/2021 1125   IRONPCTSAT 37 09/22/2021 1220         Assessment & Plan:  88 year old female with a past medical history of diverticulosis with prior diverticular bleed, hemorrhoids, history of GERD, peripheral vascular disease, history of non-Hodgkin's lymphoma in 2013, cardiomyopathy, hypertension and hyperlipidemia who is here for follow-up.   Heme positive stool without overt GI bleeding --she has no current GI complaints and is not interested in endoscopic evaluation which I think is very reasonable.  Her blood counts were checked recently and I am going to repeat CBC and IBC panel today.  She has not been anemic and if she is not iron deficient then I would recommend no further evaluation other than monitoring blood counts and iron studies routinely --Repeat CBC, IBC plus ferritin --For now continue oral iron at current dose 3 days/week  2.  GERD --intermittent, no alarm symptoms.  Diet controlled.  3.  Constipation --chronic, continue senna 1 tablet twice daily which is currently working well for her  She can follow-up as needed  20 minutes total spent today including patient facing time, coordination of care, reviewing medical history/procedures/pertinent radiology studies, and documentation of the encounter.

## 2021-11-26 NOTE — Patient Instructions (Signed)
Your provider has requested that you go to the basement level for lab work before leaving today. Press "B" on the elevator. The lab is located at the first door on the left as you exit the elevator.   If you are age 86 or older, your body mass index should be between 23-30. Your Body mass index is 17.19 kg/m. If this is out of the aforementioned range listed, please consider follow up with your Primary Care Provider.  If you are age 74 or younger, your body mass index should be between 19-25. Your Body mass index is 17.19 kg/m. If this is out of the aformentioned range listed, please consider follow up with your Primary Care Provider.   ________________________________________________________  The [facility] providers would like to encourage you to use [facility] to communicate with providers for non-urgent requests or questions.  Due to long hold times on the telephone, sending your provider a message by [facility] may be a faster and more efficient way to get a response.  Please allow 48 business hours for a response.  Please remember that this is for non-urgent requests.  _______________________________________________________  Thank you for choosing me and [facility].  Dr.Jay Pyrtle

## 2021-12-03 DIAGNOSIS — D509 Iron deficiency anemia, unspecified: Secondary | ICD-10-CM | POA: Diagnosis not present

## 2021-12-03 DIAGNOSIS — E039 Hypothyroidism, unspecified: Secondary | ICD-10-CM | POA: Diagnosis not present

## 2021-12-03 DIAGNOSIS — I10 Essential (primary) hypertension: Secondary | ICD-10-CM | POA: Diagnosis not present

## 2021-12-03 DIAGNOSIS — N1831 Chronic kidney disease, stage 3a: Secondary | ICD-10-CM | POA: Diagnosis not present

## 2021-12-31 DIAGNOSIS — E785 Hyperlipidemia, unspecified: Secondary | ICD-10-CM | POA: Diagnosis not present

## 2021-12-31 DIAGNOSIS — H9193 Unspecified hearing loss, bilateral: Secondary | ICD-10-CM | POA: Diagnosis not present

## 2021-12-31 DIAGNOSIS — Q619 Cystic kidney disease, unspecified: Secondary | ICD-10-CM | POA: Diagnosis not present

## 2021-12-31 DIAGNOSIS — E559 Vitamin D deficiency, unspecified: Secondary | ICD-10-CM | POA: Diagnosis not present

## 2021-12-31 DIAGNOSIS — I429 Cardiomyopathy, unspecified: Secondary | ICD-10-CM | POA: Diagnosis not present

## 2021-12-31 DIAGNOSIS — I10 Essential (primary) hypertension: Secondary | ICD-10-CM | POA: Diagnosis not present

## 2021-12-31 DIAGNOSIS — N1831 Chronic kidney disease, stage 3a: Secondary | ICD-10-CM | POA: Diagnosis not present

## 2021-12-31 DIAGNOSIS — Z0000 Encounter for general adult medical examination without abnormal findings: Secondary | ICD-10-CM | POA: Diagnosis not present

## 2021-12-31 DIAGNOSIS — E039 Hypothyroidism, unspecified: Secondary | ICD-10-CM | POA: Diagnosis not present

## 2021-12-31 DIAGNOSIS — Z8572 Personal history of non-Hodgkin lymphomas: Secondary | ICD-10-CM | POA: Diagnosis not present

## 2021-12-31 DIAGNOSIS — R42 Dizziness and giddiness: Secondary | ICD-10-CM | POA: Diagnosis not present

## 2021-12-31 DIAGNOSIS — I509 Heart failure, unspecified: Secondary | ICD-10-CM | POA: Diagnosis not present

## 2021-12-31 DIAGNOSIS — Z1331 Encounter for screening for depression: Secondary | ICD-10-CM | POA: Diagnosis not present

## 2022-02-06 DIAGNOSIS — R300 Dysuria: Secondary | ICD-10-CM | POA: Diagnosis not present

## 2022-03-04 DIAGNOSIS — I10 Essential (primary) hypertension: Secondary | ICD-10-CM | POA: Diagnosis not present

## 2022-03-04 DIAGNOSIS — R64 Cachexia: Secondary | ICD-10-CM | POA: Diagnosis not present

## 2022-03-04 DIAGNOSIS — I509 Heart failure, unspecified: Secondary | ICD-10-CM | POA: Diagnosis not present

## 2022-03-04 DIAGNOSIS — I429 Cardiomyopathy, unspecified: Secondary | ICD-10-CM | POA: Diagnosis not present

## 2022-03-04 DIAGNOSIS — R42 Dizziness and giddiness: Secondary | ICD-10-CM | POA: Diagnosis not present

## 2022-03-04 DIAGNOSIS — N1831 Chronic kidney disease, stage 3a: Secondary | ICD-10-CM | POA: Diagnosis not present

## 2022-03-24 DIAGNOSIS — E039 Hypothyroidism, unspecified: Secondary | ICD-10-CM | POA: Diagnosis not present

## 2022-03-24 DIAGNOSIS — I10 Essential (primary) hypertension: Secondary | ICD-10-CM | POA: Diagnosis not present

## 2022-03-24 DIAGNOSIS — E785 Hyperlipidemia, unspecified: Secondary | ICD-10-CM | POA: Diagnosis not present

## 2022-03-24 DIAGNOSIS — N1831 Chronic kidney disease, stage 3a: Secondary | ICD-10-CM | POA: Diagnosis not present

## 2022-03-28 IMAGING — CT CT CHEST-ABD-PELV W/ CM
3 of 5 series · 14 of 36 positions shown, 16 images · IV contrast (OMNIPAQUE)
Comparison: CT abdomen and pelvis dated December 12, 2019

CLINICAL DATA: History of non-Hodgkin's lymphoma

EXAM:
CT CHEST, ABDOMEN, AND PELVIS WITH CONTRAST
TECHNIQUE: Multidetector CT imaging of the chest, abdomen and pelvis was
performed following the standard protocol during bolus
administration of intravenous contrast.
CONTRAST:  60mL OMNIPAQUE IOHEXOL 350 MG/ML SOLN

[Series 2: cap with · axial · 0.70mm/px · z∈[-518,-72]mm · 9 of 113 slices shown, 11 images]
[im 12/113  mediastinal]
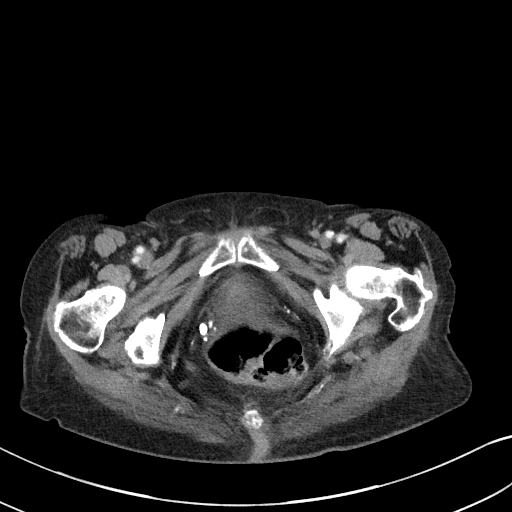
[im 12/113  bone]
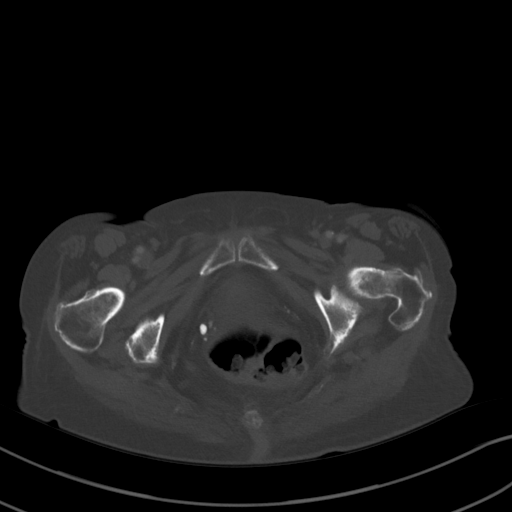
[im 23/113  mediastinal]
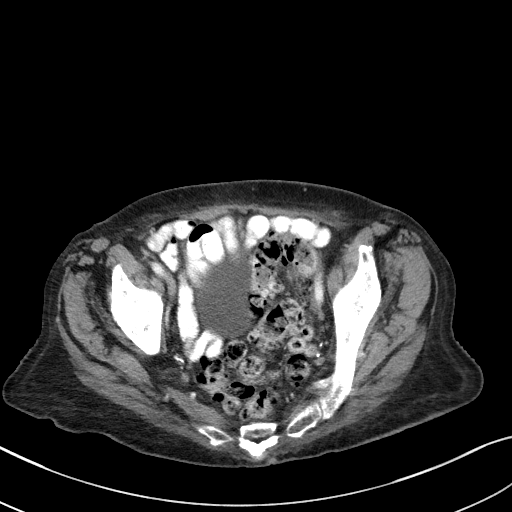
[im 34/113  mediastinal]
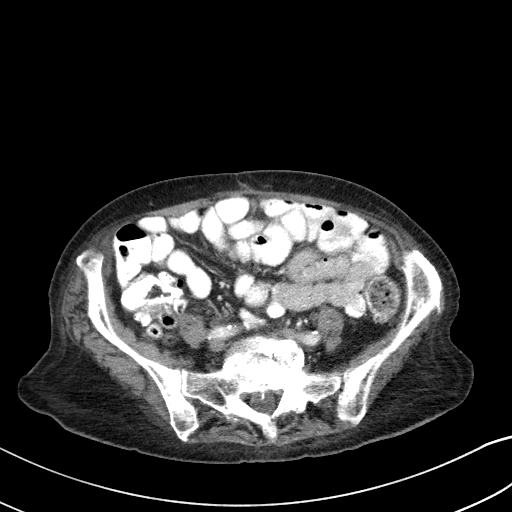
[im 45/113  mediastinal]
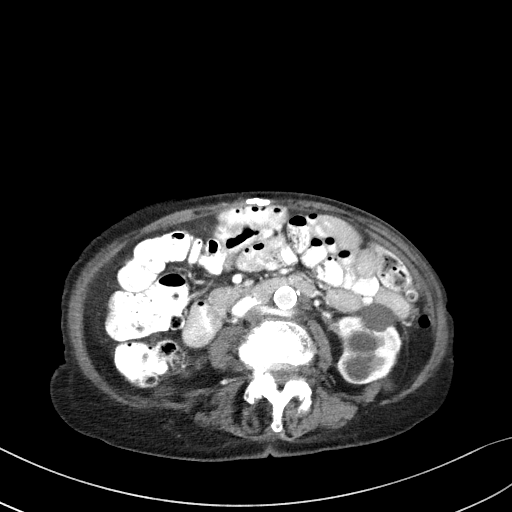
[im 57/113  mediastinal]
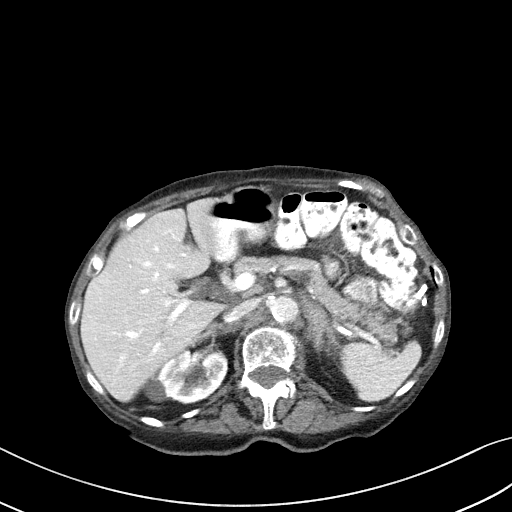
[im 68/113  mediastinal]
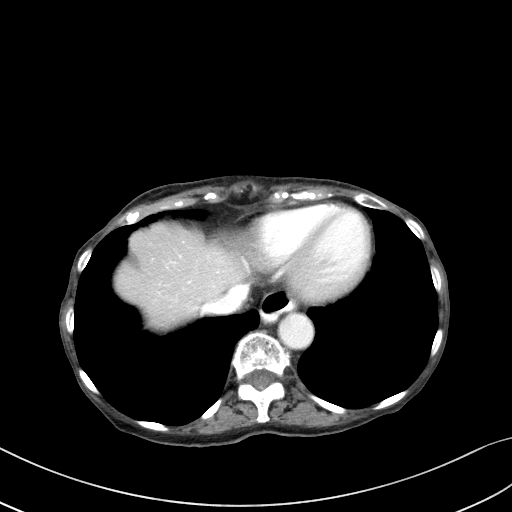
[im 79/113  mediastinal]
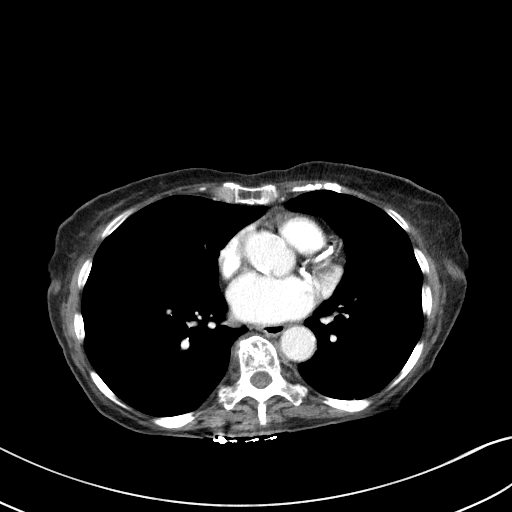
[im 90/113  mediastinal]
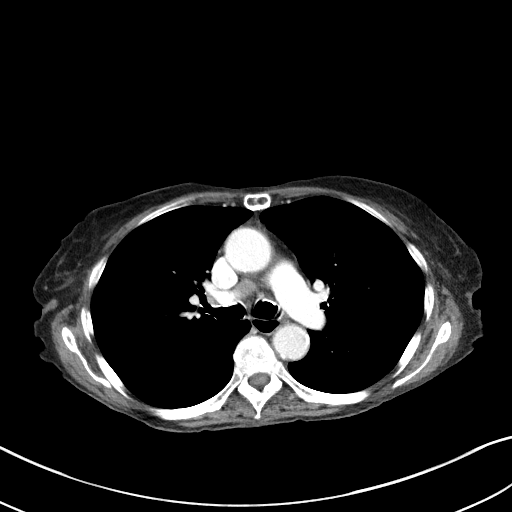
[im 101/113  mediastinal]
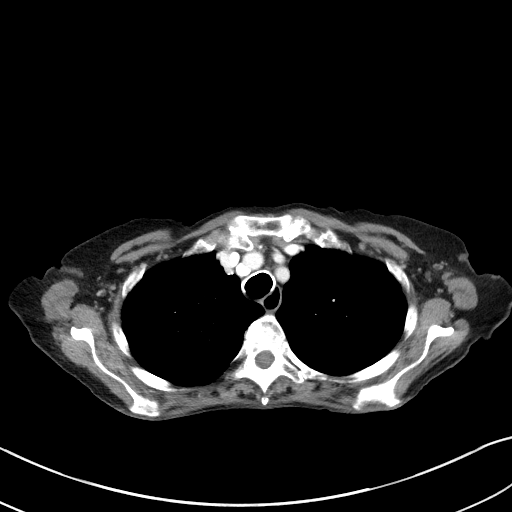
[im 101/113  bone]
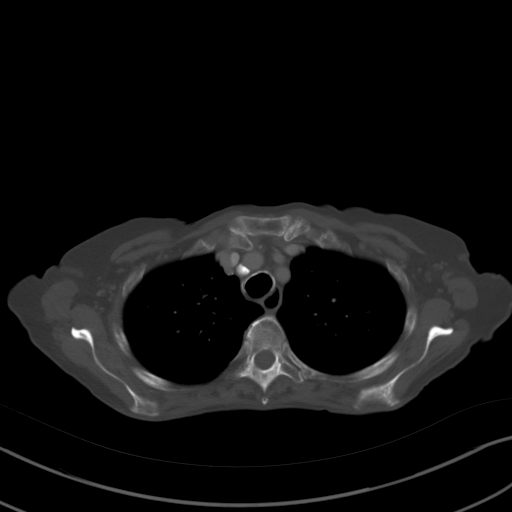

[Series 4: coronals · coronal · 0.63mm/px · 3 of 100 slices shown]
[im 20/100  mediastinal]
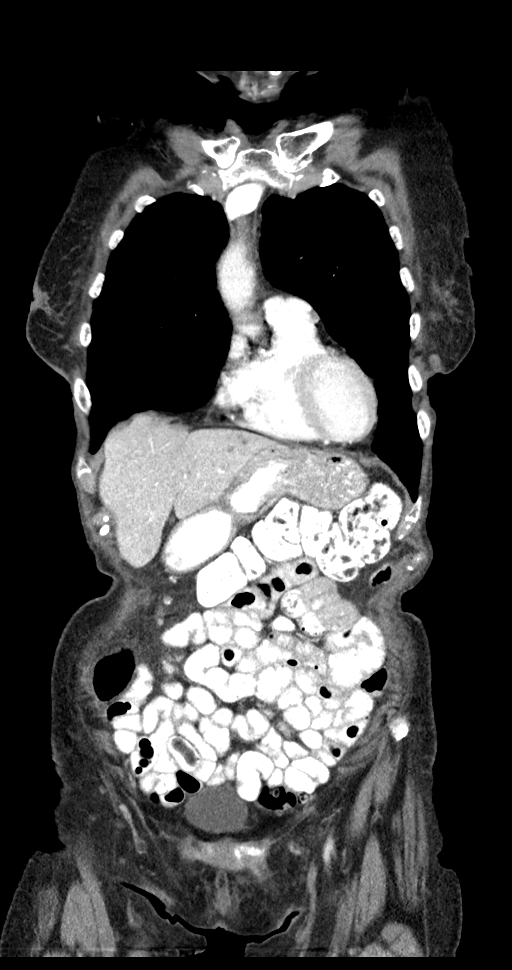
[im 40/100  mediastinal]
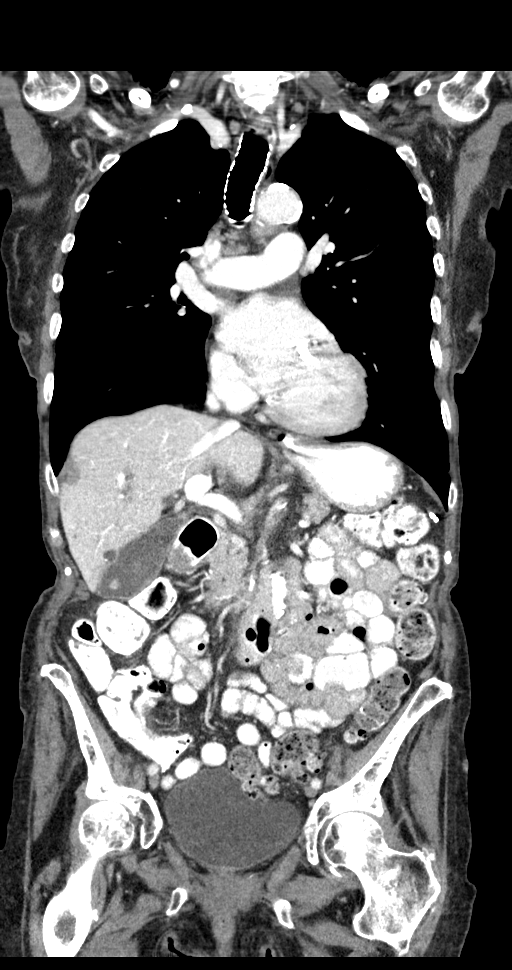
[im 60/100  mediastinal]
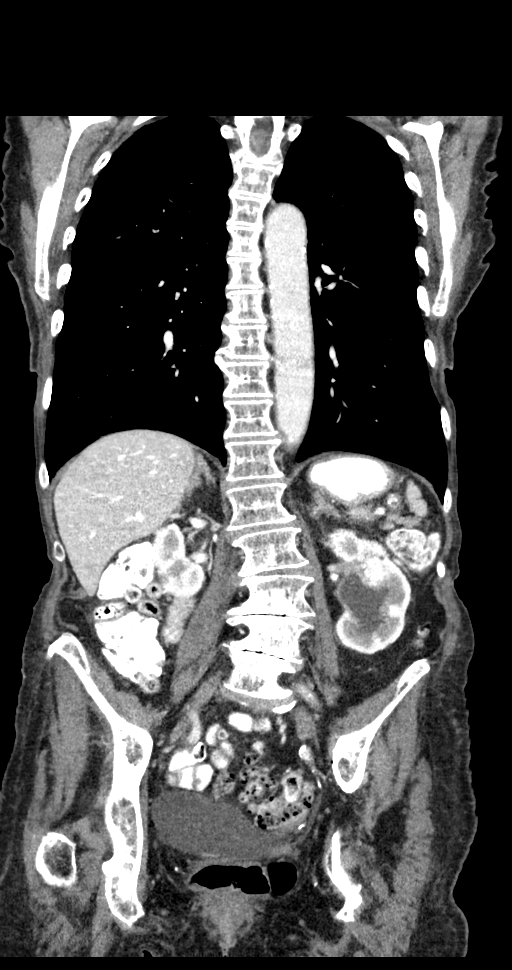

[Series 6: lung · axial · 0.73mm/px · z∈[-296,-252]mm · 2 of 147 slices shown]
[im 12/147  bone]
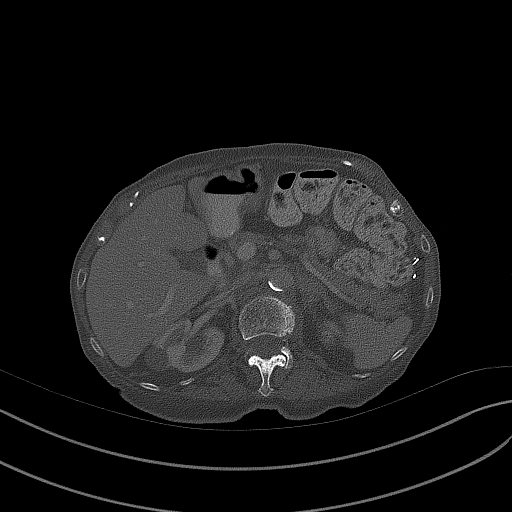
[im 34/147  bone]
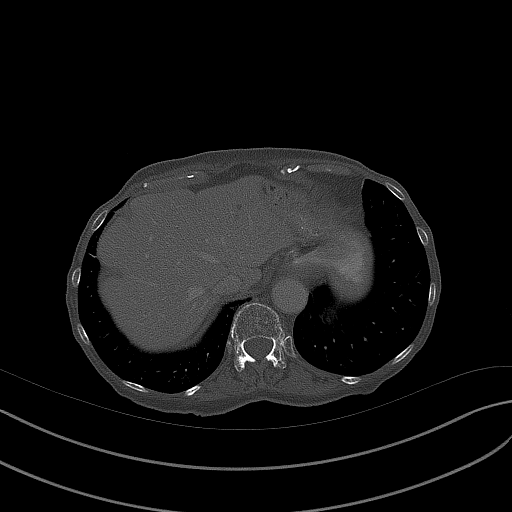

[14 of 36 positions shown; findings below may reference images not displayed]

FINDINGS: CT CHEST FINDINGS

Cardiovascular: Cardiomegaly. No pericardial effusion. Three-vessel
coronary artery calcifications. Mitral annular calcifications.
Atherosclerotic disease of the thoracic aorta.

Mediastinum/Nodes: No pathologically large lymph nodes seen in the
chest. Patulous esophagus.

Lungs/Pleura: Central airways are patent. No consolidation, pleural
effusion or pneumothorax no suspicious pulmonary nodules.

Musculoskeletal: Diffuse demineralization. No aggressive appearing
osseous lesions.

CT ABDOMEN PELVIS FINDINGS

Hepatobiliary: Unchanged scattered low-attenuation lesions of the
liver which are likely simple cysts. Cholelithiasis. No evidence of
acute cholecystitis. No biliary ductal dilation.

Pancreas: Unremarkable. No pancreatic ductal dilatation or
surrounding inflammatory changes.

Spleen: Normal in size without focal abnormality.

Adrenals/Urinary Tract: Unchanged thickening of the left adrenal
gland. Right adrenal gland is unremarkable.Kidneys enhance
symmetrically bilateral low-attenuation renal lesions, largest are
compatible with simple cysts, others are too small to completely
characterize. Unchanged mild bilateral hydronephrosis with
associated left urothelial thickening.

Stomach/Bowel: Stomach is unremarkable. Appendix is not visualized.
Numerous diverticula most pronounced at the sigmoid colon. No
inflammatory change, wall thickening, or evidence of obstruction.

Vascular/Lymphatic: Aortic atherosclerosis. No enlarged abdominal or
pelvic lymph nodes.

Reproductive: Status post hysterectomy. No adnexal masses.

Other: Soft tissue nodule in the left upper retroperitoneum is
unchanged in size compared to prior exam, measuring approximately
2.4 x 2.0 cm on series 2, image 60. Abdominal wall hernia mesh. No
abdominopelvic ascites.

Musculoskeletal: Diffuse demineralization. Degenerative changes of
the lumbar spine. No aggressive appearing osseous lesions.
IMPRESSION: No evidence of recurrent lymphoma.

Stable soft tissue nodule in the left upper retroperitoneum, likely
treated lymphoma.

Mild bilateral hydronephrosis with associated left urothelial
thickening, unchanged compared to prior.

Aortic Atherosclerosis (4BTIH-4MP.P).

## 2022-06-19 DIAGNOSIS — E785 Hyperlipidemia, unspecified: Secondary | ICD-10-CM | POA: Diagnosis not present

## 2022-06-19 DIAGNOSIS — I429 Cardiomyopathy, unspecified: Secondary | ICD-10-CM | POA: Diagnosis not present

## 2022-06-19 DIAGNOSIS — I509 Heart failure, unspecified: Secondary | ICD-10-CM | POA: Diagnosis not present

## 2022-06-19 DIAGNOSIS — Z8572 Personal history of non-Hodgkin lymphomas: Secondary | ICD-10-CM | POA: Diagnosis not present

## 2022-06-19 DIAGNOSIS — I10 Essential (primary) hypertension: Secondary | ICD-10-CM | POA: Diagnosis not present

## 2022-06-19 DIAGNOSIS — E039 Hypothyroidism, unspecified: Secondary | ICD-10-CM | POA: Diagnosis not present

## 2022-07-10 DIAGNOSIS — R0989 Other specified symptoms and signs involving the circulatory and respiratory systems: Secondary | ICD-10-CM | POA: Diagnosis not present

## 2022-07-10 DIAGNOSIS — U071 COVID-19: Secondary | ICD-10-CM | POA: Diagnosis not present

## 2022-08-25 DIAGNOSIS — N1831 Chronic kidney disease, stage 3a: Secondary | ICD-10-CM | POA: Diagnosis not present

## 2022-08-25 DIAGNOSIS — E785 Hyperlipidemia, unspecified: Secondary | ICD-10-CM | POA: Diagnosis not present

## 2022-08-25 DIAGNOSIS — E039 Hypothyroidism, unspecified: Secondary | ICD-10-CM | POA: Diagnosis not present

## 2022-08-25 DIAGNOSIS — I509 Heart failure, unspecified: Secondary | ICD-10-CM | POA: Diagnosis not present

## 2022-08-25 DIAGNOSIS — I10 Essential (primary) hypertension: Secondary | ICD-10-CM | POA: Diagnosis not present

## 2022-08-25 DIAGNOSIS — D509 Iron deficiency anemia, unspecified: Secondary | ICD-10-CM | POA: Diagnosis not present

## 2022-10-16 DIAGNOSIS — L989 Disorder of the skin and subcutaneous tissue, unspecified: Secondary | ICD-10-CM | POA: Diagnosis not present

## 2022-11-17 ENCOUNTER — Ambulatory Visit: Payer: Medicare PPO | Admitting: Dermatology

## 2022-11-17 ENCOUNTER — Encounter: Payer: Self-pay | Admitting: Dermatology

## 2022-11-17 DIAGNOSIS — L708 Other acne: Secondary | ICD-10-CM

## 2022-11-17 NOTE — Patient Instructions (Signed)
Due to recent changes in healthcare laws, you may see results of your pathology and/or laboratory studies on MyChart before the doctors have had a chance to review them. We understand that in some cases there may be results that are confusing or concerning to you. Please understand that not all results are received at the same time and often the doctors may need to interpret multiple results in order to provide you with the best plan of care or course of treatment. Therefore, we ask that you please give us 2 business days to thoroughly review all your results before contacting the office for clarification. Should we see a critical lab result, you will be contacted sooner.   If You Need Anything After Your Visit  If you have any questions or concerns for your doctor, please call our main line at [phone] If no one answers, please leave a voicemail as directed and we will return your call as soon as possible. Messages left after 4 pm will be answered the following business day.   You may also send us a message via MyChart. We typically respond to MyChart messages within 1-2 business days.  For prescription refills, please ask your pharmacy to contact our office. Our fax number is [phone].  If you have an urgent issue when the clinic is closed that cannot wait until the next business day, you can page your doctor at the number below.    Please note that while we do our best to be available for urgent issues outside of office hours, we are not available 24/7.   If you have an urgent issue and are unable to reach us, you may choose to seek medical care at your doctor's office, retail clinic, urgent care center, or emergency room.  If you have a medical emergency, please immediately call 911 or go to the emergency department. In the event of inclement weather, please call our main line at [phone] for an update on the status of any delays or closures.  Dermatology Medication Tips: Please  keep the boxes that topical medications come in in order to help keep track of the instructions about where and how to use these. Pharmacies typically print the medication instructions only on the boxes and not directly on the medication tubes.   If your medication is too expensive, please contact our office at [phone] or send us a message through MyChart.   We are unable to tell what your co-pay for medications will be in advance as this is different depending on your insurance coverage. However, we may be able to find a substitute medication at lower cost or fill out paperwork to get insurance to cover a needed medication.   If a prior authorization is required to get your medication covered by your insurance company, please allow us 1-2 business days to complete this process.  Drug prices often vary depending on where the prescription is filled and some pharmacies may offer cheaper prices.  The website www.goodrx.com contains coupons for medications through different pharmacies. The prices here do not account for what the cost may be with help from insurance (it may be cheaper with your insurance), but the website can give you the price if you did not use any insurance.  - You can print the associated coupon and take it with your prescription to the pharmacy.  - You may also stop by our office during regular business hours and pick up a GoodRx coupon card.  - If you need your   prescription sent electronically to a different pharmacy, notify our office through [facility] MyChart or by phone at [phone]     

## 2022-11-17 NOTE — Progress Notes (Signed)
   New Patient Visit   Subjective  Cindy Mccormick is a 89 y.o. female who presents for the following: Spot Check  Spot has been in corner of left eye for a couple of months. She thought it was a blackhead but it did not go away. Patent went to a walk in clinic the beginning of April. The doctor there told her it was a cyst. Spot itches sometimes but does not hurt. She would like it removed.     The following portions of the chart were reviewed this encounter and updated as appropriate: medications, allergies, medical history  Review of Systems:  No other skin or systemic complaints except as noted in HPI or Assessment and Plan.  Objective  Well appearing patient in no apparent distress; mood and affect are within normal limits.  A focused examination was performed of the following areas: Left eye   Relevant exam findings are noted in the Assessment and Plan.    Assessment & Plan   Dilated Pore Exam: Closed [place]one   Treatment Plan: -Reassured pt lesion is benign  -It really bothers pt so she'd like help removing it today -Extraction performed using 18G needle and 2 cotton tip applicators -    Return if symptoms worsen or fail to improve.    Documentation: I have reviewed the above documentation for accuracy and completeness, and I agree with the above.  Langston Reusing, DO   I, Germaine Pomfret, CMA, am acting as scribe for [employer], DO.

## 2022-12-09 ENCOUNTER — Encounter: Payer: Self-pay | Admitting: Dermatology

## 2023-01-04 DIAGNOSIS — I10 Essential (primary) hypertension: Secondary | ICD-10-CM | POA: Diagnosis not present

## 2023-01-04 DIAGNOSIS — I428 Other cardiomyopathies: Secondary | ICD-10-CM | POA: Diagnosis not present

## 2023-01-04 DIAGNOSIS — Z1331 Encounter for screening for depression: Secondary | ICD-10-CM | POA: Diagnosis not present

## 2023-01-04 DIAGNOSIS — Z8572 Personal history of non-Hodgkin lymphomas: Secondary | ICD-10-CM | POA: Diagnosis not present

## 2023-01-04 DIAGNOSIS — I509 Heart failure, unspecified: Secondary | ICD-10-CM | POA: Diagnosis not present

## 2023-01-04 DIAGNOSIS — D509 Iron deficiency anemia, unspecified: Secondary | ICD-10-CM | POA: Diagnosis not present

## 2023-01-04 DIAGNOSIS — Z0000 Encounter for general adult medical examination without abnormal findings: Secondary | ICD-10-CM | POA: Diagnosis not present

## 2023-01-04 DIAGNOSIS — E785 Hyperlipidemia, unspecified: Secondary | ICD-10-CM | POA: Diagnosis not present

## 2023-01-04 DIAGNOSIS — Q619 Cystic kidney disease, unspecified: Secondary | ICD-10-CM | POA: Diagnosis not present

## 2023-01-04 DIAGNOSIS — E559 Vitamin D deficiency, unspecified: Secondary | ICD-10-CM | POA: Diagnosis not present

## 2023-01-04 DIAGNOSIS — N1831 Chronic kidney disease, stage 3a: Secondary | ICD-10-CM | POA: Diagnosis not present

## 2023-01-18 ENCOUNTER — Ambulatory Visit: Payer: Medicare PPO | Admitting: Dermatology

## 2023-01-19 ENCOUNTER — Ambulatory Visit: Payer: Medicare PPO | Admitting: Dermatology

## 2023-04-23 DIAGNOSIS — R051 Acute cough: Secondary | ICD-10-CM | POA: Diagnosis not present

## 2023-04-23 DIAGNOSIS — S79912A Unspecified injury of left hip, initial encounter: Secondary | ICD-10-CM | POA: Diagnosis not present

## 2023-06-10 DIAGNOSIS — I509 Heart failure, unspecified: Secondary | ICD-10-CM | POA: Diagnosis not present

## 2023-06-10 DIAGNOSIS — N1831 Chronic kidney disease, stage 3a: Secondary | ICD-10-CM | POA: Diagnosis not present

## 2023-06-10 DIAGNOSIS — R42 Dizziness and giddiness: Secondary | ICD-10-CM | POA: Diagnosis not present

## 2023-06-10 DIAGNOSIS — I700 Atherosclerosis of aorta: Secondary | ICD-10-CM | POA: Diagnosis not present

## 2023-06-10 DIAGNOSIS — E269 Hyperaldosteronism, unspecified: Secondary | ICD-10-CM | POA: Diagnosis not present

## 2023-06-10 DIAGNOSIS — R64 Cachexia: Secondary | ICD-10-CM | POA: Diagnosis not present

## 2023-06-10 DIAGNOSIS — Q619 Cystic kidney disease, unspecified: Secondary | ICD-10-CM | POA: Diagnosis not present

## 2023-06-10 DIAGNOSIS — D509 Iron deficiency anemia, unspecified: Secondary | ICD-10-CM | POA: Diagnosis not present

## 2023-06-10 DIAGNOSIS — I130 Hypertensive heart and chronic kidney disease with heart failure and stage 1 through stage 4 chronic kidney disease, or unspecified chronic kidney disease: Secondary | ICD-10-CM | POA: Diagnosis not present

## 2023-06-10 DIAGNOSIS — I429 Cardiomyopathy, unspecified: Secondary | ICD-10-CM | POA: Diagnosis not present

## 2023-10-07 ENCOUNTER — Ambulatory Visit
Admission: EM | Admit: 2023-10-07 | Discharge: 2023-10-07 | Disposition: A | Discharge status: Home / Self Care | Attending: Internal Medicine | Admitting: Internal Medicine

## 2023-10-07 ENCOUNTER — Encounter: Payer: Self-pay | Admitting: Emergency Medicine

## 2023-10-07 DIAGNOSIS — M25512 Pain in left shoulder: Secondary | ICD-10-CM

## 2023-10-07 MED ORDER — DEXAMETHASONE SODIUM PHOSPHATE 10 MG/ML IJ SOLN
10.0000 mg | Freq: Once | INTRAMUSCULAR | Status: AC
  Administered 2023-10-07: 10 mg via INTRAMUSCULAR

## 2023-10-07 NOTE — Discharge Instructions (Addendum)
 We gave you a shot of steroid in the clinic to reduce inflammation to the shoulder.  Hopefully we'll be able to reduce inflammation enough to get you some good sleep!  Use a heating pad 20 minutes on 20 minutes off as needed for pain and inflammation.  Do not place the heating pad directly to your back, wear a shirt and place pad over shirt to avoid burns to skin.  Take tylenol every 6 hours as needed for pain.   Please schedule a follow-up appointment with orthopedic provider as needed.   If you develop any new or worsening symptoms or if your symptoms do not start to improve, please return here or follow-up with your primary care provider. If your symptoms are severe, please go to the emergency room.

## 2023-10-07 NOTE — ED Provider Notes (Addendum)
 [facility]    CSN: [id number] Arrival date & time: 10/07/23  1045      History   Chief Complaint Chief Complaint  Patient presents with   Shoulder Pain    HPI Cindy Mccormick is a 89 y.o. female.   Cindy Mccormick is a 89 y.o. female presenting for chief complaint of left shoulder pain that has been an ongoing problem for the last several months and has worsened over the last 3-4 days. Pain is usually localized to the left medial shoulder blade and improves when her physical therapist granddaughter rubs and massages it. The pain has intensified over the last few days and now radiates to the left arm and radiates to the left elbow intermittently. Pain wakes her up out of her sleep when she is pulling the covers and is triggered by movement of the left arm. Denies recent falls, trauma, injuries to the left shoulder. No chest pain, heart palpitations, dizziness, left ear pain, neck pain, fever/chills, rash, cough, or shortness of breath. Denies previous injuries to the left shoulder. She has been applying heat to the area of pain with some relief. She has not attempted use of any OTC medications to help with pain such as tylenol.    Shoulder Pain   Past Medical History:  Diagnosis Date   Cancer [facility])    recurrent lymphoma   Cancer (HCC)    carcinoid cancer per outside record   CARDIOMYOPATHY    DIVERTICULITIS, COLON    GERD (gastroesophageal reflux disease)    H/O: hysterectomy 07/07/1975   Hemorrhoids, internal    HYPERLIPIDEMIA    HYPERTENSION    Hypothyroidism    IDA (iron deficiency anemia)    IDA (iron deficiency anemia)    LEFT BUNDLE BRANCH BLOCK    LYMPHOMA    Maintenance chemotherapy    Skin cancer    melanoma diagnosed elsewhere 1999   Vitamin D deficiency     Patient Active Problem List   Diagnosis Date Noted   Seborrheic keratoses, inflamed 03/12/2021   Weight loss, non-intentional 03/11/2021   Dysuria 02/22/2017   History of B-cell  lymphoma 04/23/2015   Hypotension 11/09/2013   Low back pain 06/08/2013   UNSPECIFIED PERIPHERAL VASCULAR DISEASE 09/05/2010   Constipation 06/11/2009   HEARTBURN 06/11/2009   History of non-Hodgkin's lymphoma 12/09/2007   HYPERLIPIDEMIA 12/09/2007   Essential hypertension 12/09/2007   Systolic CHF (HCC) 12/09/2007   LEFT BUNDLE BRANCH BLOCK 12/09/2007   DIVERTICULITIS, COLON 12/09/2007    Past Surgical History:  Procedure Laterality Date   APPENDECTOMY  1949   COLONOSCOPY WITH PROPOFOL N/A 11/16/2013   Procedure: COLONOSCOPY WITH PROPOFOL;  Surgeon: Rachael Fee, MD;  Location: [facility] ENDOSCOPY;  Service: Endoscopy;  Laterality: N/A;   Lymphoma biopsies     TOE SURGERY      OB History   No obstetric history on file.      Home Medications    Prior to Admission medications   Medication Sig Start Date End Date Taking? Authorizing Provider  diltiazem (CARDIZEM CD) 180 MG 24 hr capsule Take 180 mg by mouth daily.    [provider]  ferrous sulfate 325 (65 FE) MG tablet Take 325 mg by mouth every 3 (three) days.    [provider]  levothyroxine (SYNTHROID, LEVOTHROID) 25 MCG tablet Take 25 mcg by mouth daily.  01/05/17   [provider]  Multiple Vitamin (MULTIVITAMIN WITH MINERALS) TABS tablet Take 1 tablet by mouth daily.  [provider]  sennosides-docusate sodium (SENOKOT-S) 8.6-50 MG tablet Take 1 tablet by mouth 2 (two) times daily.    [provider]  spironolactone (ALDACTONE) 50 MG tablet Take 50 mg by mouth daily.    [provider]  Vitamin D, Cholecalciferol, 25 MCG (1000 UT) CAPS Take 1 capsule by mouth daily.    [provider]    Family History Family History  Problem Relation Age of Onset   Lymphoma Mother    Sudden death Father    Hypertension Neg Hx    Hyperlipidemia Neg Hx    Heart attack Neg Hx    Diabetes Neg Hx    Colon cancer Neg Hx    Esophageal cancer Neg Hx    Liver cancer Neg Hx      Social History Social History   Tobacco Use   Smoking status: Former   Smokeless tobacco: Never  Vaping Use   Vaping status: Never Used  Substance Use Topics   Alcohol use: No   Drug use: No     Allergies   Oxycodone, Sulfa antibiotics, Tramadol, Ezetimibe, Hydrocodone-acetaminophen, Lisinopril, Nitrofurantoin, Other, Oxycodone hcl, Sulfonamide derivatives, and Torsemide   Review of Systems Review of Systems Per HPI  Physical Exam Triage Vital Signs ED Triage Vitals  Encounter Vitals Group     BP 10/07/23 1055 (!) 165/79     Systolic BP Percentile --      Diastolic BP Percentile --      Pulse Rate 10/07/23 1055 86     Resp 10/07/23 1055 17     Temp 10/07/23 1055 98.1 F (36.7 C)     Temp Source 10/07/23 1055 Oral     SpO2 10/07/23 1055 96 %     Weight --      Height --      Head Circumference --      Peak Flow --      Pain Score 10/07/23 1059 6     Pain Loc --      Pain Education --      Exclude from Growth Chart --    No data found.  Updated Vital Signs BP (!) 165/79 (BP Location: Right Arm)   Pulse 86   Temp 98.1 F (36.7 C) (Oral)   Resp 17   SpO2 96%   Visual Acuity Right Eye Distance:   Left Eye Distance:   Bilateral Distance:    Right Eye Near:   Left Eye Near:    Bilateral Near:     Physical Exam Vitals and nursing note reviewed.  Constitutional:      Appearance: She is not ill-appearing or toxic-appearing.  HENT:     Head: Normocephalic and atraumatic.     Right Ear: Hearing and external ear normal.     Left Ear: Hearing and external ear normal.     Nose: Nose normal.     Mouth/Throat:     Lips: Pink.  Eyes:     General: Lids are normal. Vision grossly intact. Gaze aligned appropriately.     Extraocular Movements: Extraocular movements intact.     Conjunctiva/sclera: Conjunctivae normal.  Cardiovascular:     Rate and Rhythm: Normal rate and regular rhythm.     Heart sounds: Normal heart sounds, S1 normal and S2 normal.   Pulmonary:     Effort: Pulmonary effort is normal. No respiratory distress.     Breath sounds: Normal breath sounds and air entry.  Musculoskeletal:     Right shoulder:  Normal.     Left shoulder: Tenderness (Mild tenderness to palpation over the diffuse bony shoulder joint and into the left deltoid/elbow.) and bony tenderness present. No swelling, deformity, effusion, laceration or crepitus. Normal range of motion. Normal strength. Normal pulse.       Arms:     Cervical back: Normal and neck supple.     Thoracic back: Normal.     Lumbar back: Normal.     Comments: 5/5 power with adduction and abduction of the left upper extremity at the shoulder joint. No overlying rash to area of pain. Sensation intact to distal left upper extremity. Normal ROM of left shoulder, elbow, and wrist.   Skin:    General: Skin is warm and dry.     Capillary Refill: Capillary refill takes less than 2 seconds.     Findings: No rash.  Neurological:     General: No focal deficit present.     Mental Status: She is alert and oriented to person, place, and time. Mental status is at baseline.     Cranial Nerves: No dysarthria or facial asymmetry.  Psychiatric:        Mood and Affect: Mood normal.        Speech: Speech normal.        Behavior: Behavior normal.        Thought Content: Thought content normal.        Judgment: Judgment normal.      UC Treatments / Results  Labs (all labs ordered are listed, but only abnormal results are displayed) Labs Reviewed - No data to display  EKG   Radiology No results found.  Procedures Procedures (including critical care time)  Medications Ordered in UC Medications  dexamethasone (DECADRON) injection 10 mg (10 mg Intramuscular Given 10/07/23 1136)    Initial Impression / Assessment and Plan / UC Course  I have reviewed the triage vital signs and the nursing notes.  Pertinent labs & imaging results that were available during my care of the patient were  reviewed by me and considered in my medical decision making (see chart for details).   1. Acute pain of left shoulder Suspect musculoskeletal pain versus cardiac involvement. She is well-appearing with hemodynamically stable vital signs.  Stable musculoskeletal findings and atraumatic mechanism of pain/injury, therefore deferred imaging of the left shoulder/spine. Non-tender to spinous processes. No overlying rash to skin.  Suspect tendinitis etiology with underlying arthritis.  Will treat conservatively with dexamethasone 10mg  IM in clinic to reduce inflammation due to radicular symptoms from left shoulder to left elbow. Supportive care with heat, gentle ROM exercises, and tylenol as needed recommended.  Walking referral to orthopedics provided for follow-up.  Neurovascularly intact distally to pain.  Counseled patient on potential for adverse effects with medications prescribed/recommended today, strict ER and return-to-clinic precautions discussed, patient verbalized understanding.    Final Clinical Impressions(s) / UC Diagnoses   Final diagnoses:  Acute pain of left shoulder     Discharge Instructions      We gave you a shot of steroid in the clinic to reduce inflammation to the shoulder.  Hopefully we'll be able to reduce inflammation enough to get you some good sleep!  Use a heating pad 20 minutes on 20 minutes off as needed for pain and inflammation.  Do not place the heating pad directly to your back, wear a shirt and place pad over shirt to avoid burns to skin.  Take tylenol every 6 hours as needed for pain.  Please schedule a follow-up appointment with orthopedic provider as needed.   If you develop any new or worsening symptoms or if your symptoms do not start to improve, please return here or follow-up with your primary care provider. If your symptoms are severe, please go to the emergency room.    ED Prescriptions   None    PDMP not reviewed this encounter.    Carlisle Beers, FNP 10/07/23 1141    Carlisle Beers, FNP 10/07/23 [phone]

## 2023-10-07 NOTE — ED Triage Notes (Signed)
 Pt c/o left shoulder pain for 3 days. Denies any know injury. It is keeping her up at night

## 2023-11-12 DIAGNOSIS — M25512 Pain in left shoulder: Secondary | ICD-10-CM | POA: Diagnosis not present

## 2023-11-12 DIAGNOSIS — M542 Cervicalgia: Secondary | ICD-10-CM | POA: Diagnosis not present

## 2023-12-20 DIAGNOSIS — M25512 Pain in left shoulder: Secondary | ICD-10-CM | POA: Diagnosis not present

## 2024-01-06 DIAGNOSIS — Q619 Cystic kidney disease, unspecified: Secondary | ICD-10-CM | POA: Diagnosis not present

## 2024-01-06 DIAGNOSIS — I429 Cardiomyopathy, unspecified: Secondary | ICD-10-CM | POA: Diagnosis not present

## 2024-01-06 DIAGNOSIS — Z1331 Encounter for screening for depression: Secondary | ICD-10-CM | POA: Diagnosis not present

## 2024-01-06 DIAGNOSIS — D509 Iron deficiency anemia, unspecified: Secondary | ICD-10-CM | POA: Diagnosis not present

## 2024-01-06 DIAGNOSIS — E785 Hyperlipidemia, unspecified: Secondary | ICD-10-CM | POA: Diagnosis not present

## 2024-01-06 DIAGNOSIS — Z0000 Encounter for general adult medical examination without abnormal findings: Secondary | ICD-10-CM | POA: Diagnosis not present

## 2024-01-06 DIAGNOSIS — N1831 Chronic kidney disease, stage 3a: Secondary | ICD-10-CM | POA: Diagnosis not present

## 2024-01-06 DIAGNOSIS — E559 Vitamin D deficiency, unspecified: Secondary | ICD-10-CM | POA: Diagnosis not present

## 2024-01-06 DIAGNOSIS — Z8572 Personal history of non-Hodgkin lymphomas: Secondary | ICD-10-CM | POA: Diagnosis not present

## 2024-01-06 DIAGNOSIS — I10 Essential (primary) hypertension: Secondary | ICD-10-CM | POA: Diagnosis not present

## 2024-01-06 DIAGNOSIS — I509 Heart failure, unspecified: Secondary | ICD-10-CM | POA: Diagnosis not present

## 2024-01-19 DIAGNOSIS — M25512 Pain in left shoulder: Secondary | ICD-10-CM | POA: Diagnosis not present

## 2024-02-17 DIAGNOSIS — H5203 Hypermetropia, bilateral: Secondary | ICD-10-CM | POA: Diagnosis not present

## 2024-02-17 DIAGNOSIS — H524 Presbyopia: Secondary | ICD-10-CM | POA: Diagnosis not present

## 2024-02-17 DIAGNOSIS — H52223 Regular astigmatism, bilateral: Secondary | ICD-10-CM | POA: Diagnosis not present

## 2024-02-22 DIAGNOSIS — N390 Urinary tract infection, site not specified: Secondary | ICD-10-CM | POA: Diagnosis not present

## 2024-02-22 DIAGNOSIS — I10 Essential (primary) hypertension: Secondary | ICD-10-CM | POA: Diagnosis not present

## 2024-03-31 ENCOUNTER — Other Ambulatory Visit (HOSPITAL_COMMUNITY): Payer: Self-pay | Admitting: Internal Medicine

## 2024-03-31 ENCOUNTER — Ambulatory Visit (HOSPITAL_COMMUNITY)
Admission: RE | Admit: 2024-03-31 | Discharge: 2024-03-31 | Disposition: A | Discharge status: Home / Self Care | Source: Ambulatory Visit | Attending: Internal Medicine | Admitting: Internal Medicine

## 2024-03-31 DIAGNOSIS — R2689 Other abnormalities of gait and mobility: Secondary | ICD-10-CM | POA: Diagnosis not present

## 2024-03-31 DIAGNOSIS — R42 Dizziness and giddiness: Secondary | ICD-10-CM | POA: Insufficient documentation

## 2024-03-31 DIAGNOSIS — R55 Syncope and collapse: Secondary | ICD-10-CM

## 2024-04-01 DIAGNOSIS — I739 Peripheral vascular disease, unspecified: Secondary | ICD-10-CM | POA: Diagnosis not present

## 2024-04-01 DIAGNOSIS — N1831 Chronic kidney disease, stage 3a: Secondary | ICD-10-CM | POA: Diagnosis not present

## 2024-04-01 DIAGNOSIS — I5030 Unspecified diastolic (congestive) heart failure: Secondary | ICD-10-CM | POA: Diagnosis not present

## 2024-04-01 DIAGNOSIS — R42 Dizziness and giddiness: Secondary | ICD-10-CM | POA: Diagnosis not present

## 2024-04-01 DIAGNOSIS — R55 Syncope and collapse: Secondary | ICD-10-CM | POA: Diagnosis not present

## 2024-04-01 DIAGNOSIS — I428 Other cardiomyopathies: Secondary | ICD-10-CM | POA: Diagnosis not present

## 2024-04-01 DIAGNOSIS — I130 Hypertensive heart and chronic kidney disease with heart failure and stage 1 through stage 4 chronic kidney disease, or unspecified chronic kidney disease: Secondary | ICD-10-CM | POA: Diagnosis not present

## 2024-04-01 DIAGNOSIS — G319 Degenerative disease of nervous system, unspecified: Secondary | ICD-10-CM | POA: Diagnosis not present

## 2024-04-01 DIAGNOSIS — R2689 Other abnormalities of gait and mobility: Secondary | ICD-10-CM | POA: Diagnosis not present

## 2024-04-24 DIAGNOSIS — R42 Dizziness and giddiness: Secondary | ICD-10-CM | POA: Diagnosis not present

## 2024-05-11 DIAGNOSIS — R32 Unspecified urinary incontinence: Secondary | ICD-10-CM | POA: Diagnosis not present

## 2024-05-11 DIAGNOSIS — M1990 Unspecified osteoarthritis, unspecified site: Secondary | ICD-10-CM | POA: Diagnosis not present

## 2024-05-11 DIAGNOSIS — I130 Hypertensive heart and chronic kidney disease with heart failure and stage 1 through stage 4 chronic kidney disease, or unspecified chronic kidney disease: Secondary | ICD-10-CM | POA: Diagnosis not present

## 2024-05-11 DIAGNOSIS — I739 Peripheral vascular disease, unspecified: Secondary | ICD-10-CM | POA: Diagnosis not present

## 2024-05-11 DIAGNOSIS — I509 Heart failure, unspecified: Secondary | ICD-10-CM | POA: Diagnosis not present

## 2024-05-11 DIAGNOSIS — R64 Cachexia: Secondary | ICD-10-CM | POA: Diagnosis not present

## 2024-05-11 DIAGNOSIS — I700 Atherosclerosis of aorta: Secondary | ICD-10-CM | POA: Diagnosis not present

## 2024-05-11 DIAGNOSIS — E785 Hyperlipidemia, unspecified: Secondary | ICD-10-CM | POA: Diagnosis not present

## 2024-05-11 DIAGNOSIS — I429 Cardiomyopathy, unspecified: Secondary | ICD-10-CM | POA: Diagnosis not present

## 2024-05-12 DIAGNOSIS — I5030 Unspecified diastolic (congestive) heart failure: Secondary | ICD-10-CM | POA: Diagnosis not present

## 2024-05-12 DIAGNOSIS — G319 Degenerative disease of nervous system, unspecified: Secondary | ICD-10-CM | POA: Diagnosis not present

## 2024-05-12 DIAGNOSIS — I130 Hypertensive heart and chronic kidney disease with heart failure and stage 1 through stage 4 chronic kidney disease, or unspecified chronic kidney disease: Secondary | ICD-10-CM | POA: Diagnosis not present

## 2024-05-12 DIAGNOSIS — N1831 Chronic kidney disease, stage 3a: Secondary | ICD-10-CM | POA: Diagnosis not present

## 2024-05-12 DIAGNOSIS — R2689 Other abnormalities of gait and mobility: Secondary | ICD-10-CM | POA: Diagnosis not present

## 2024-05-12 DIAGNOSIS — R42 Dizziness and giddiness: Secondary | ICD-10-CM | POA: Diagnosis not present

## 2024-05-12 DIAGNOSIS — I428 Other cardiomyopathies: Secondary | ICD-10-CM | POA: Diagnosis not present

## 2024-05-12 DIAGNOSIS — R55 Syncope and collapse: Secondary | ICD-10-CM | POA: Diagnosis not present

## 2024-05-12 DIAGNOSIS — I739 Peripheral vascular disease, unspecified: Secondary | ICD-10-CM | POA: Diagnosis not present

## 2024-05-18 DIAGNOSIS — R55 Syncope and collapse: Secondary | ICD-10-CM | POA: Diagnosis not present

## 2024-05-26 DIAGNOSIS — R001 Bradycardia, unspecified: Secondary | ICD-10-CM | POA: Diagnosis not present

## 2024-05-26 DIAGNOSIS — R000 Tachycardia, unspecified: Secondary | ICD-10-CM | POA: Diagnosis not present

## 2024-05-26 DIAGNOSIS — R079 Chest pain, unspecified: Secondary | ICD-10-CM | POA: Diagnosis not present

## 2024-05-26 DIAGNOSIS — R0902 Hypoxemia: Secondary | ICD-10-CM | POA: Diagnosis not present

## 2024-05-26 DIAGNOSIS — J9601 Acute respiratory failure with hypoxia: Secondary | ICD-10-CM | POA: Diagnosis not present

## 2024-05-27 ENCOUNTER — Other Ambulatory Visit: Payer: Self-pay

## 2024-05-27 ENCOUNTER — Observation Stay (HOSPITAL_COMMUNITY)
Admission: EM | Admit: 2024-05-27 | Discharge: 2024-05-29 | Disposition: A | Discharge status: Home / Self Care | Attending: Internal Medicine | Admitting: Internal Medicine

## 2024-05-27 ENCOUNTER — Inpatient Hospital Stay (HOSPITAL_COMMUNITY)

## 2024-05-27 ENCOUNTER — Emergency Department (HOSPITAL_COMMUNITY)

## 2024-05-27 ENCOUNTER — Encounter (HOSPITAL_COMMUNITY): Payer: Self-pay

## 2024-05-27 DIAGNOSIS — I5020 Unspecified systolic (congestive) heart failure: Secondary | ICD-10-CM | POA: Diagnosis present

## 2024-05-27 DIAGNOSIS — I10 Essential (primary) hypertension: Secondary | ICD-10-CM | POA: Diagnosis present

## 2024-05-27 DIAGNOSIS — J9601 Acute respiratory failure with hypoxia: Principal | ICD-10-CM | POA: Insufficient documentation

## 2024-05-27 DIAGNOSIS — E785 Hyperlipidemia, unspecified: Secondary | ICD-10-CM | POA: Insufficient documentation

## 2024-05-27 DIAGNOSIS — I341 Nonrheumatic mitral (valve) prolapse: Secondary | ICD-10-CM | POA: Diagnosis not present

## 2024-05-27 DIAGNOSIS — R2689 Other abnormalities of gait and mobility: Secondary | ICD-10-CM | POA: Diagnosis not present

## 2024-05-27 DIAGNOSIS — R2681 Unsteadiness on feet: Secondary | ICD-10-CM | POA: Insufficient documentation

## 2024-05-27 DIAGNOSIS — R0602 Shortness of breath: Secondary | ICD-10-CM | POA: Diagnosis not present

## 2024-05-27 DIAGNOSIS — E46 Unspecified protein-calorie malnutrition: Secondary | ICD-10-CM | POA: Diagnosis not present

## 2024-05-27 DIAGNOSIS — Z85828 Personal history of other malignant neoplasm of skin: Secondary | ICD-10-CM | POA: Diagnosis not present

## 2024-05-27 DIAGNOSIS — I5023 Acute on chronic systolic (congestive) heart failure: Principal | ICD-10-CM | POA: Insufficient documentation

## 2024-05-27 DIAGNOSIS — I342 Nonrheumatic mitral (valve) stenosis: Secondary | ICD-10-CM | POA: Diagnosis not present

## 2024-05-27 DIAGNOSIS — E039 Hypothyroidism, unspecified: Secondary | ICD-10-CM | POA: Insufficient documentation

## 2024-05-27 DIAGNOSIS — Z85030 Personal history of malignant carcinoid tumor of large intestine: Secondary | ICD-10-CM | POA: Insufficient documentation

## 2024-05-27 DIAGNOSIS — R531 Weakness: Secondary | ICD-10-CM | POA: Diagnosis not present

## 2024-05-27 DIAGNOSIS — I509 Heart failure, unspecified: Secondary | ICD-10-CM | POA: Insufficient documentation

## 2024-05-27 DIAGNOSIS — I700 Atherosclerosis of aorta: Secondary | ICD-10-CM | POA: Diagnosis not present

## 2024-05-27 DIAGNOSIS — I110 Hypertensive heart disease with heart failure: Secondary | ICD-10-CM | POA: Diagnosis not present

## 2024-05-27 DIAGNOSIS — R918 Other nonspecific abnormal finding of lung field: Secondary | ICD-10-CM | POA: Diagnosis not present

## 2024-05-27 DIAGNOSIS — Z87891 Personal history of nicotine dependence: Secondary | ICD-10-CM | POA: Diagnosis not present

## 2024-05-27 DIAGNOSIS — R079 Chest pain, unspecified: Secondary | ICD-10-CM | POA: Diagnosis present

## 2024-05-27 DIAGNOSIS — J90 Pleural effusion, not elsewhere classified: Secondary | ICD-10-CM | POA: Diagnosis not present

## 2024-05-27 LAB — I-STAT CHEM 8, ED
BUN: 26 mg/dL — ABNORMAL HIGH (ref 8–23)
Calcium, Ion: 1.07 mmol/L — ABNORMAL LOW (ref 1.15–1.40)
Chloride: 107 mmol/L (ref 98–111)
Creatinine, Ser: 0.8 mg/dL (ref 0.44–1.00)
Glucose, Bld: 111 mg/dL — ABNORMAL HIGH (ref 70–99)
HCT: 37 % (ref 36.0–46.0)
Hemoglobin: 12.6 g/dL (ref 12.0–15.0)
Potassium: 4.6 mmol/L (ref 3.5–5.1)
Sodium: 138 mmol/L (ref 135–145)
TCO2: 24 mmol/L (ref 22–32)

## 2024-05-27 LAB — CBC WITH DIFFERENTIAL/PLATELET
Abs Immature Granulocytes: 0.08 K/uL — ABNORMAL HIGH (ref 0.00–0.07)
Basophils Absolute: 0.1 K/uL (ref 0.0–0.1)
Basophils Relative: 1 %
Eosinophils Absolute: 0.1 K/uL (ref 0.0–0.5)
Eosinophils Relative: 0 %
HCT: 38.9 % (ref 36.0–46.0)
Hemoglobin: 12.6 g/dL (ref 12.0–15.0)
Immature Granulocytes: 1 %
Lymphocytes Relative: 9 %
Lymphs Abs: 1.3 K/uL (ref 0.7–4.0)
MCH: 32.2 pg (ref 26.0–34.0)
MCHC: 32.4 g/dL (ref 30.0–36.0)
MCV: 99.5 fL (ref 80.0–100.0)
Monocytes Absolute: 0.8 K/uL (ref 0.1–1.0)
Monocytes Relative: 5 %
Neutro Abs: 12.4 K/uL — ABNORMAL HIGH (ref 1.7–7.7)
Neutrophils Relative %: 84 %
Platelets: 352 K/uL (ref 150–400)
RBC: 3.91 MIL/uL (ref 3.87–5.11)
RDW: 13.9 % (ref 11.5–15.5)
WBC: 14.8 K/uL — ABNORMAL HIGH (ref 4.0–10.5)
nRBC: 0 % (ref 0.0–0.2)

## 2024-05-27 LAB — BRAIN NATRIURETIC PEPTIDE: B Natriuretic Peptide: 1504.6 pg/mL — ABNORMAL HIGH (ref 0.0–100.0)

## 2024-05-27 LAB — BLOOD CULTURE ID PANEL (REFLEXED) - BCID2

## 2024-05-27 LAB — RESPIRATORY PANEL BY PCR

## 2024-05-27 LAB — ECHOCARDIOGRAM COMPLETE
AR max vel: 1.87 cm2
AV Area VTI: 1.9 cm2
AV Area mean vel: 1.87 cm2
AV Mean grad: 8.5 mmHg
AV Peak grad: 16.1 mmHg
Ao pk vel: 2.01 m/s
Area-P 1/2: 4.21 cm2
Height: 58 in
MV VTI: 2.61 cm2
S' Lateral: 4.1 cm
Weight: 1390.4 [oz_av]

## 2024-05-27 LAB — PROCALCITONIN: Procalcitonin: <0.1 ng/mL

## 2024-05-27 LAB — TSH: TSH: 5 u[IU]/mL — ABNORMAL HIGH (ref 0.350–4.500)

## 2024-05-27 LAB — COMPREHENSIVE METABOLIC PANEL WITH GFR
ALT: 11 U/L (ref 0–44)
AST: 32 U/L (ref 15–41)
Albumin: 2.9 g/dL — ABNORMAL LOW (ref 3.5–5.0)
Alkaline Phosphatase: 84 U/L (ref 38–126)
Anion gap: 15 (ref 5–15)
BUN: 16 mg/dL (ref 8–23)
CO2: 20 mmol/L — ABNORMAL LOW (ref 22–32)
Calcium: 8.5 mg/dL — ABNORMAL LOW (ref 8.9–10.3)
Chloride: 106 mmol/L (ref 98–111)
Creatinine, Ser: 0.87 mg/dL (ref 0.44–1.00)
GFR, Estimated: >60 mL/min (ref >60)
Glucose, Bld: 112 mg/dL — ABNORMAL HIGH (ref 70–99)
Potassium: 4.7 mmol/L (ref 3.5–5.1)
Sodium: 141 mmol/L (ref 135–145)
Total Bilirubin: 1.3 mg/dL — ABNORMAL HIGH (ref 0.0–1.2)
Total Protein: 6 g/dL — ABNORMAL LOW (ref 6.5–8.1)

## 2024-05-27 LAB — TROPONIN I (HIGH SENSITIVITY)
Troponin I (High Sensitivity): 26 ng/L — ABNORMAL HIGH (ref <18)
Troponin I (High Sensitivity): 26 ng/L — ABNORMAL HIGH (ref <18)

## 2024-05-27 LAB — RESP PANEL BY RT-PCR (RSV, FLU A&B, COVID)  RVPGX2
Influenza A by PCR: NEGATIVE
Influenza B by PCR: NEGATIVE
Resp Syncytial Virus by PCR: NEGATIVE
SARS Coronavirus 2 by RT PCR: NEGATIVE

## 2024-05-27 LAB — C-REACTIVE PROTEIN: CRP: <0.5 mg/dL (ref <1.0)

## 2024-05-27 MED ORDER — PERFLUTREN LIPID MICROSPHERE
1.0000 mL | INTRAVENOUS | Status: AC | PRN
  Administered 2024-05-27: 4 mL via INTRAVENOUS

## 2024-05-27 MED ORDER — LEVOTHYROXINE SODIUM 50 MCG PO TABS
50.0000 ug | ORAL_TABLET | Freq: Every day | ORAL | Status: DC
  Administered 2024-05-27 – 2024-05-29 (×3): 50 ug via ORAL
  Filled 2024-05-27 (×3): qty 1

## 2024-05-27 MED ORDER — BENZONATATE 100 MG PO CAPS: 200.0000 mg | ORAL_CAPSULE | Freq: Three times a day (TID) | ORAL | Status: DC | PRN

## 2024-05-27 MED ORDER — FUROSEMIDE 10 MG/ML IJ SOLN
40.0000 mg | Freq: Once | INTRAMUSCULAR | Status: AC
  Administered 2024-05-27: 40 mg via INTRAVENOUS
  Filled 2024-05-27: qty 4

## 2024-05-27 MED ORDER — BENZONATATE 100 MG PO CAPS
200.0000 mg | ORAL_CAPSULE | Freq: Two times a day (BID) | ORAL | Status: DC
  Administered 2024-05-27 – 2024-05-29 (×5): 200 mg via ORAL
  Filled 2024-05-27 (×5): qty 2

## 2024-05-27 MED ORDER — ALBUTEROL SULFATE (2.5 MG/3ML) 0.083% IN NEBU: 2.5000 mg | INHALATION_SOLUTION | RESPIRATORY_TRACT | Status: DC | PRN

## 2024-05-27 MED ORDER — MELATONIN 5 MG PO TABS
5.0000 mg | ORAL_TABLET | Freq: Every evening | ORAL | Status: DC | PRN
Start: 2024-05-27 — End: 2024-05-29
  Administered 2024-05-28: 5 mg via ORAL
  Filled 2024-05-27: qty 1

## 2024-05-27 MED ORDER — SPIRONOLACTONE 25 MG PO TABS
25.0000 mg | ORAL_TABLET | Freq: Every day | ORAL | Status: DC
  Administered 2024-05-27 – 2024-05-29 (×3): 25 mg via ORAL
  Filled 2024-05-27 (×3): qty 1

## 2024-05-27 MED ORDER — SODIUM CHLORIDE 0.9 % IV SOLN
500.0000 mg | Freq: Once | INTRAVENOUS | Status: AC
  Administered 2024-05-27: 500 mg via INTRAVENOUS
  Filled 2024-05-27: qty 5

## 2024-05-27 MED ORDER — FUROSEMIDE 20 MG PO TABS
20.0000 mg | ORAL_TABLET | Freq: Every day | ORAL | Status: DC
  Administered 2024-05-28: 20 mg via ORAL
  Filled 2024-05-27 (×2): qty 1

## 2024-05-27 MED ORDER — GUAIFENESIN ER 600 MG PO TB12
600.0000 mg | ORAL_TABLET | Freq: Two times a day (BID) | ORAL | Status: DC
  Administered 2024-05-27 – 2024-05-29 (×5): 600 mg via ORAL
  Filled 2024-05-27 (×5): qty 1

## 2024-05-27 MED ORDER — MENTHOL 3 MG MT LOZG
1.0000 | LOZENGE | OROMUCOSAL | Status: DC | PRN
  Filled 2024-05-27: qty 9

## 2024-05-27 MED ORDER — POLYETHYLENE GLYCOL 3350 17 G PO PACK: 17.0000 g | PACK | Freq: Every day | ORAL | Status: DC | PRN

## 2024-05-27 MED ORDER — PANTOPRAZOLE SODIUM 40 MG PO TBEC
40.0000 mg | DELAYED_RELEASE_TABLET | Freq: Every day | ORAL | Status: DC
  Administered 2024-05-27 – 2024-05-29 (×3): 40 mg via ORAL
  Filled 2024-05-27 (×3): qty 1

## 2024-05-27 MED ORDER — ONDANSETRON HCL 4 MG PO TABS: 4.0000 mg | ORAL_TABLET | Freq: Four times a day (QID) | ORAL | Status: DC | PRN

## 2024-05-27 MED ORDER — ENOXAPARIN SODIUM 300 MG/3ML IJ SOLN
20.0000 mg | INTRAMUSCULAR | Status: DC
  Administered 2024-05-27 – 2024-05-28 (×2): 20 mg via SUBCUTANEOUS
  Filled 2024-05-27 (×3): qty 0.2

## 2024-05-27 MED ORDER — ONDANSETRON HCL 4 MG/2ML IJ SOLN: 4.0000 mg | Freq: Four times a day (QID) | INTRAMUSCULAR | Status: DC | PRN

## 2024-05-27 MED ORDER — IBUPROFEN 400 MG PO TABS: 400.0000 mg | ORAL_TABLET | Freq: Four times a day (QID) | ORAL | Status: DC | PRN

## 2024-05-27 MED ORDER — FUROSEMIDE 20 MG PO TABS: 20.0000 mg | ORAL_TABLET | Freq: Every day | ORAL | Status: DC

## 2024-05-27 MED ORDER — SODIUM CHLORIDE 0.9 % IV SOLN
1.0000 g | Freq: Once | INTRAVENOUS | Status: AC
  Administered 2024-05-27: 1 g via INTRAVENOUS
  Filled 2024-05-27: qty 10

## 2024-05-27 NOTE — Progress Notes (Signed)
 bump on left hip that is sore off and on. Denies trauma to the hip

## 2024-05-27 NOTE — ED Notes (Signed)
 RN assisted pt to restroom with wheelchair.

## 2024-05-27 NOTE — ED Provider Notes (Signed)
 Emergency Department Provider Note   I have reviewed the triage vital signs and the nursing notes.   HISTORY  Chief Complaint Chest Pain   HPI Cindy Mccormick is a 89 y.o. female with past history reviewed below presents to the emergency department for evaluation of chest pain and shortness of breath.  She has been feeling intermittent symptoms over the past 2 weeks.  She describes her chest pain as irritation worse with SOB symptoms. Thought pain was indigestion at first.  She states typically she is able to control her symptoms at home but when things seem to worsen this evening she called EMS.  They found her to be saturating 88% on room air upon arrival and placed her on 3 L for transport.  She is feeling much better.  Denies swelling in her legs.  She does have a mild cough but this is nonproductive.  No active chest pain at this time.    Past Medical History:  Diagnosis Date   Cancer [facility])    recurrent lymphoma   Cancer (HCC)    carcinoid cancer per outside record   CARDIOMYOPATHY    DIVERTICULITIS, COLON    GERD (gastroesophageal reflux disease)    H/O: hysterectomy 07/07/1975   Hemorrhoids, internal    HYPERLIPIDEMIA    HYPERTENSION    Hypothyroidism    IDA (iron deficiency anemia)    IDA (iron deficiency anemia)    LEFT BUNDLE BRANCH BLOCK    LYMPHOMA    Maintenance chemotherapy    Skin cancer    melanoma diagnosed elsewhere 1999   Vitamin D deficiency     Review of Systems  Constitutional: No fever/chills Cardiovascular: Positive chest pain. Respiratory: Positive shortness of breath. Gastrointestinal: No abdominal pain.  No nausea, no vomiting.   Neurological: Negative for headaches, focal weakness or numbness.  ____________________________________________   PHYSICAL EXAM:  VITAL SIGNS: ED Triage Vitals  Encounter Vitals Group     BP 05/27/24 0010 (!) 122/98     Pulse Rate 05/27/24 0010 (!) 102     Resp 05/27/24 0010 (!) 28     Temp --       Temp src --      SpO2 05/27/24 0010 92 %     Weight 05/27/24 0016 88 lb (39.9 kg)   Constitutional: Alert and oriented. Well appearing and in no acute distress. Eyes: Conjunctivae are normal.  Head: Atraumatic. Nose: No congestion/rhinnorhea. Mouth/Throat: Mucous membranes are moist.  Neck: No stridor.   Cardiovascular: Normal rate, regular rhythm. Good peripheral circulation. Grossly normal heart sounds.   Respiratory: Normal respiratory effort.  No retractions. Lungs CTAB. Gastrointestinal: Soft and nontender. No distention.  Musculoskeletal: No gross deformities of extremities. Neurologic:  Normal speech and language.  Skin:  Skin is warm, dry and intact. No rash noted.   ____________________________________________   LABS (all labs ordered are listed, but only abnormal results are displayed)  Labs Reviewed  CBC WITH DIFFERENTIAL/PLATELET - Abnormal; Notable for the following components:      Result Value   WBC 14.8 (*)    Neutro Abs 12.4 (*)    Abs Immature Granulocytes 0.08 (*)    All other components within normal limits  I-STAT CHEM 8, ED - Abnormal; Notable for the following components:   BUN 26 (*)    Glucose, Bld 111 (*)    Calcium, Ion 1.07 (*)    All other components within normal limits  RESP PANEL BY RT-PCR (RSV, FLU A&B, COVID)  [id number]  COMPREHENSIVE METABOLIC PANEL WITH GFR  BRAIN NATRIURETIC PEPTIDE  TROPONIN I (HIGH SENSITIVITY)   ____________________________________________  EKG   EKG Interpretation Date/Time:  Saturday May 27 2024 00:26:26 EST Ventricular Rate:  95 PR Interval:  155 QRS Duration:  123 QT Interval:  396 QTC Calculation: 498 R Axis:   139  Text Interpretation: Sinus rhythm Nonspecific intraventricular conduction delay Anterior infarct, old Repol abnrm suggests ischemia, lateral leads Confirmed by Darra Chew [phone]) on 05/27/2024 12:31:07 AM        ____________________________________________  RADIOLOGY  No  results found.  ____________________________________________   PROCEDURES  Procedure(s) performed:   Procedures   ____________________________________________   INITIAL IMPRESSION / ASSESSMENT AND PLAN / ED COURSE  Pertinent labs & imaging results that were available during my care of the patient were reviewed by me and considered in my medical decision making (see chart for details).   This patient is Presenting for Evaluation of CP, which does require a range of treatment options, and is a complaint that involves a high risk of morbidity and mortality.  The Differential Diagnoses includes but is not exclusive to acute coronary syndrome, aortic dissection, pulmonary embolism, cardiac tamponade, community-acquired pneumonia, pericarditis, musculoskeletal chest wall pain, etc.   Critical Interventions-    Medications - No data to display  Reassessment after intervention:    Clinical Laboratory Tests Ordered, included ***  Radiologic Tests Ordered, included CXR. I independently interpreted the images and agree with radiology interpretation.   Cardiac Monitor Tracing which shows NSR.    Social Determinants of Health Risk patient is a non-smoker.   Consult complete with  Medical Decision Making: Summary:  The patient presents to the emergency department with chest discomfort and shortness of breath.  Mild hypoxemia on arrival.  No active chest pain currently.  Describes intermittent symptoms over the last couple of weeks. EKG without acute ischemic change.   Reevaluation with update and discussion with   ***Considered admission***  Patient's presentation is most consistent with acute presentation with potential threat to life or bodily function.   Disposition:   ____________________________________________  FINAL CLINICAL IMPRESSION(S) / ED DIAGNOSES  Final diagnoses:  None     NEW OUTPATIENT MEDICATIONS STARTED DURING THIS VISIT:  New Prescriptions   No  medications on file    Note:  This document was prepared using Dragon voice recognition software and may include unintentional dictation errors.  Chew Darra, MD, [facility]

## 2024-05-27 NOTE — ED Notes (Signed)
 Daughter at [facility]. Pt alert, NAD, calm, interactive, denies pain. Echo into room, at [facility].

## 2024-05-27 NOTE — Progress Notes (Signed)
 Echocardiogram 2D Echocardiogram has been performed.  Thea Norlander 05/27/2024, 2:26 PM

## 2024-05-27 NOTE — ED Notes (Signed)
 RN assisted pt to restroom with wheelchair. RN collected a UA and sent to lab.

## 2024-05-27 NOTE — H&P (Addendum)
 History and Physical    Cindy Mccormick FMW:[id number] DOB: [birth year] DOA: 05/27/2024  DOS: the patient was seen and examined on 05/27/2024  PCP: Delice Charleston, MD (Inactive)   Patient coming from: Home  I have personally briefly reviewed patient's old medical records in [facility] and CareEverywhere  HPI:   Cindy Mccormick is a 89 y.o. year old female with past medical history of CHF, PVD, GERD, diverticulitis, hypertension, hyperlipidemia, hard of hearing, known prior non-Hodgkin lymphoma.  She presents to [facility], ED with throat irritation, nonproductive cough, and associated mild shortness of breath that began 2 to 3 days ago.  Denies any sick contacts, rhinorrhea, chest pain, palpitations, nausea, or vomiting.  Though she denies chest pain for me she did endorse chest pain described as irritation to the ED physician.  She does endorse mild swelling over left ankle associated with walking or standing for a period of time.  Otherwise denies symptoms of fluid volume overload and reports compliance with her outpatient medications.  ED Course: On arrival to [facility] ED BP 122/98 HR 102, RR 28, SpO2 92% on 2 L via nasal cannula.  CXR obtained and showed bibasilar airspace opacities left greater than right and a small right pleural effusion.  Labs notable for total protein 6.0, albumin 2.9, BNP fifteen 4.6, troponin flat at 26, Leukocytosis with WBC 14.8, and blood cultures collected and in process.  She was given azithromycin , ceftriaxone , 40 mg IV Lasix . TRH contacted for admission.   Review of Systems:  Review of Systems  Constitutional:  Negative for chills, fever and malaise/fatigue.  HENT:  Positive for hearing loss and sore throat. Negative for congestion and sinus pain.   Respiratory:  Positive for shortness of breath. Negative for cough and sputum production.   Cardiovascular:  Negative for chest pain and palpitations.  Gastrointestinal:  Negative for abdominal  pain, nausea and vomiting.  Musculoskeletal:  Negative for falls and myalgias.  Neurological:  Positive for weakness. Negative for focal weakness.  All other systems reviewed and are negative.   Past Medical History:  Diagnosis Date   Cancer [facility])    recurrent lymphoma   Cancer (HCC)    carcinoid cancer per outside record   CARDIOMYOPATHY    DIVERTICULITIS, COLON    GERD (gastroesophageal reflux disease)    H/O: hysterectomy 07/07/1975   Hemorrhoids, internal    HYPERLIPIDEMIA    HYPERTENSION    Hypothyroidism    IDA (iron deficiency anemia)    IDA (iron deficiency anemia)    LEFT BUNDLE BRANCH BLOCK    LYMPHOMA    Maintenance chemotherapy    Skin cancer    melanoma diagnosed elsewhere 1999   Vitamin D deficiency     Past Surgical History:  Procedure Laterality Date   APPENDECTOMY  1949   COLONOSCOPY WITH PROPOFOL  N/A 11/16/2013   Procedure: COLONOSCOPY WITH PROPOFOL ;  Surgeon: Toribio SHAUNNA Cedar, MD;  Location: [facility] ENDOSCOPY;  Service: Endoscopy;  Laterality: N/A;   Lymphoma biopsies     TOE SURGERY       reports that she has quit smoking. She has never used smokeless tobacco. She reports that she does not drink alcohol and does not use drugs.  Allergies  Allergen Reactions   Beta Adrenergic Blockers     Insomnia    Oxycodone Itching   Tramadol  Itching   Ezetimibe     Unknown reaction   Hydrocodone-Acetaminophen  Nausea Only   Lisinopril  Rash   Nitrofurantoin  Not effective per [facility]   Other Itching and Rash    chemo medication given at [facility] in [place], [place]   Oxycodone Hcl Nausea Only   Sulfa Antibiotics Rash   Sulfonamide Derivatives Rash   Torsemide Itching and Rash    Family History  Problem Relation Age of Onset   Lymphoma Mother    Sudden death Father    Hypertension Neg Hx    Hyperlipidemia Neg Hx    Heart attack Neg Hx    Diabetes Neg Hx    Colon cancer Neg Hx    Esophageal cancer Neg Hx    Liver cancer Neg Hx      Prior to Admission medications   Medication Sig Start Date End Date Taking? Authorizing Provider  diltiazem (CARDIZEM CD) 120 MG 24 hr capsule Take 120 mg by mouth daily. 05/10/24  Yes [provider]  levothyroxine  (SYNTHROID , LEVOTHROID) 25 MCG tablet Take 25 mcg by mouth daily.  01/05/17  Yes [provider]  Multiple Vitamin (MULTIVITAMIN WITH MINERALS) TABS tablet Take 1 tablet by mouth daily.   Yes [provider]  sennosides-docusate sodium (SENOKOT-S) 8.6-50 MG tablet Take 1 tablet by mouth 2 (two) times daily.   Yes [provider]  spironolactone  (ALDACTONE ) 50 MG tablet Take 25 mg by mouth daily.   Yes [provider]  Vitamin D, Cholecalciferol, 25 MCG (1000 UT) CAPS Take 1 capsule by mouth daily.   Yes [provider]  ferrous sulfate 325 (65 FE) MG tablet Take 325 mg by mouth every 3 (three) days.    [provider]    Physical Exam: Vitals:   05/27/24 1030 05/27/24 1045 05/27/24 1100 05/27/24 1142  BP: (!) 111/59 102/63 (!) 104/58   Pulse: 78 77 71   Resp: 16 20 15    Temp:    99.1 F (37.3 C)  TempSrc:    Oral  SpO2: 96% 97% 97%   Weight:      Height:        Physical Exam Vitals and nursing note reviewed.  HENT:     Head: Normocephalic.     Right Ear: Decreased hearing noted.     Left Ear: Decreased hearing noted.  Eyes:     Pupils: Pupils are equal, round, and reactive to light.  Cardiovascular:     Rate and Rhythm: Normal rate and regular rhythm.     Pulses:          Radial pulses are 2+ on the right side and 2+ on the left side.       Dorsalis pedis pulses are 2+ on the right side and 2+ on the left side.     Heart sounds: Normal heart sounds.  Pulmonary:     Effort: Pulmonary effort is normal.     Breath sounds: Normal breath sounds.  Abdominal:     Palpations: Abdomen is soft.  Skin:    General: Skin is warm and dry.     Capillary Refill: Capillary refill takes less than 2 seconds.   Neurological:     General: No focal deficit present.     Mental Status: She is alert.     Labs on Admission: I have personally reviewed following labs and imaging studies  CBC: Recent Labs  Lab 05/27/24 0013 05/27/24 0029  WBC 14.8*  --   NEUTROABS 12.4*  --   HGB 12.6 12.6  HCT 38.9 37.0  MCV 99.5  --   PLT 352  --  Basic Metabolic Panel: Recent Labs  Lab 05/27/24 0013 05/27/24 0029  NA 141 138  K 4.7 4.6  CL 106 107  CO2 20*  --   GLUCOSE 112* 111*  BUN 16 26*  CREATININE 0.87 0.80  CALCIUM 8.5*  --    GFR: Estimated Creatinine Clearance: 27.1 mL/min (by C-G formula based on SCr of 0.8 mg/dL). Liver Function Tests: Recent Labs  Lab 05/27/24 0013  AST 32  ALT 11  ALKPHOS 84  BILITOT 1.3*  PROT 6.0*  ALBUMIN 2.9*   No results for input(s): LIPASE, AMYLASE in the last 168 hours. No results for input(s): AMMONIA in the last 168 hours. Coagulation Profile: No results for input(s): INR, PROTIME in the last 168 hours. Cardiac Enzymes: Recent Labs  Lab 05/27/24 0013 05/27/24 0225  TROPONINIHS 26* 26*   BNP (last 3 results) Recent Labs    05/27/24 0013  BNP 1,504.6*   HbA1C: No results for input(s): HGBA1C in the last 72 hours. CBG: No results for input(s): GLUCAP in the last 168 hours. Lipid Profile: No results for input(s): CHOL, HDL, LDLCALC, TRIG, CHOLHDL, LDLDIRECT in the last 72 hours. Thyroid  Function Tests: Recent Labs    05/27/24 0809  TSH 5.000*   Anemia Panel: No results for input(s): VITAMINB12, FOLATE, FERRITIN, TIBC, IRON, RETICCTPCT in the last 72 hours. Urine analysis:    Component Value Date/Time   COLORURINE YELLOW 11/12/2013 1145   APPEARANCEUR CLOUDY (A) 11/12/2013 1145   LABSPEC 1.005 02/23/2017 1114   PHURINE 6.0 02/23/2017 1114   PHURINE 5.0 11/12/2013 1145   GLUCOSEU Negative 02/23/2017 1114   HGBUR Negative 02/23/2017 1114   HGBUR LARGE (A) 11/12/2013 1145   BILIRUBINUR  Negative 02/23/2017 1114   KETONESUR Negative 02/23/2017 1114   KETONESUR NEGATIVE 11/12/2013 1145   PROTEINUR < 30 02/23/2017 1114   PROTEINUR >300 (A) 11/12/2013 1145   UROBILINOGEN 0.2 02/23/2017 1114   NITRITE Negative 02/23/2017 1114   NITRITE NEGATIVE 11/12/2013 1145   LEUKOCYTESUR Trace 02/23/2017 1114    Radiological Exams on Admission: I have personally reviewed images DG Chest 2 View Result Date: 05/27/2024 EXAM: 2 VIEW(S) XRAY OF THE CHEST 05/27/2024 12:55:05 AM COMPARISON: 03/24/2019 CLINICAL HISTORY: CP CP FINDINGS: LUNGS AND PLEURA: Small right pleural effusion. Bibasilar airspace opacities, left greater than right, atelectasis or infiltrates. No pneumothorax. HEART AND MEDIASTINUM: Aortic atherosclerosis. No acute abnormality of the cardiac and mediastinal silhouettes. BONES AND SOFT TISSUES: No acute osseous abnormality. IMPRESSION: 1. Bibasilar airspace opacities, left greater than right, which may represent atelectasis or infiltrates. 2. Small right pleural effusion. Electronically signed by: Franky Crease MD 05/27/2024 01:00 AM EST RP Workstation: [id number]    Assessment/Plan Active Problems:   Acute hypoxic respiratory failure (HCC)   ##Community Acquired Pneumonia, likely viral ##Acute Hypoxic respiratory failure, resolved PCT <0.10 - Hold further antibiotics - Supportive care - Pulmonary Toilet - Check 20 pathogen respiratory panel  #HFpEF ##Elevated BNP ##Small (R) Pleural Effusion - s/p 40 mg IV lasix  in ED - Patient not fluid volume overloaded on exam and no rales heard - Lasix  as needed - Continue home spironolactone  - ECHO - Strict I's and O's, Daily weights - Heart healthy diet with fluid restriction  ## Protein calorie malnutrition - Dietitian consult  #Hypertension - BP soft 104/58, hold home diltiazem for now  #Hypothyroidism - Check TSH --> 5.0 - Increase home Synthroid  from 25 mcg daily to 50 mcg daily   VTE prophylaxis:  Lovenox   GI  prophylaxis: Protonix  Diet:  Heart healthy with 1800 mL fluid restriction Access: PIV Lines: None Code Status:  Full Code Telemetry: Yes  Admission status: Inpatient, Telemetry bed Patient is from: Home Anticipated d/c is to: Home Anticipated d/c date is: 1 to 2 days   Family Communication: Friend at bedside  Consults called: N/A    Severity of Illness: The appropriate patient status for this patient is INPATIENT. Inpatient status is judged to be reasonable and necessary in order to provide the required intensity of service to ensure the patient's safety. The patient's presenting symptoms, physical exam findings, and initial radiographic and laboratory data in the context of their chronic comorbidities is felt to place them at high risk for further clinical deterioration. Furthermore, it is not anticipated that the patient will be medically stable for discharge from the hospital within 2 midnights of admission.   * I certify that at the point of admission it is my clinical judgment that the patient will require inpatient hospital care spanning beyond 2 midnights from the point of admission due to high intensity of service, high risk for further deterioration and high frequency of surveillance required.*  To reach the provider On-Call:   7AM- 7PM see care teams to locate the attending and reach out to them via [email or web address] Password: [email or web address] 7PM-7AM contact night-coverage If you still have difficulty reaching the appropriate provider, please page the [facility] (Director on Call) for [facility] on amion for assistance  This document was prepared using Conservation officer, historic buildings and may include unintentional dictation errors.  Rockie Rams FNP-BC, PMHNP-BC Nurse Practitioner [facility]

## 2024-05-27 NOTE — ED Notes (Signed)
 Pt guest stated pt is hard of hearing d/t missing one hearing aide

## 2024-05-27 NOTE — ED Notes (Signed)
 Echo complete. Up to b//r with min assist. Family present. NAD, calm, steady gait.

## 2024-05-27 NOTE — ED Triage Notes (Signed)
 Pt BIB EMS with c/o chest pain that started earlier tonight. Pt reports SOB. Pt was hypoxic at 88% on RA on EMS arrival. Pt placed on 3L and oxygen sat increased to 94%. Pt a&ox4.    HR 120 94% on 3L 81mg  of aspirin   20G L AC

## 2024-05-27 NOTE — ED Notes (Signed)
 Pt provided warm blanket over feet

## 2024-05-27 NOTE — ED Notes (Signed)
NT assisted pt to restroom.

## 2024-05-27 NOTE — Plan of Care (Incomplete)

## 2024-05-27 NOTE — Progress Notes (Signed)
 PHARMACY - PHYSICIAN COMMUNICATION CRITICAL VALUE ALERT - BLOOD CULTURE IDENTIFICATION (BCID)  Cindy Mccormick is an 89 y.o. female who presented to [facility] on 05/27/2024 with a chief complaint of SOB, indigestion  Assessment:  Blood cultures show GPC in 1/3 bottles and BCID shows staph species with no resistance  Name of physician (or Provider) Contacted: Dr, Sundil  Current antibiotics: none  Changes to prescribed antibiotics recommended:  No antibiotics needed Recommendations accepted by provider  Results for orders placed or performed during the hospital encounter of 05/27/24  Blood Culture ID Panel (Reflexed) (Collected: 05/27/2024  1:15 AM)  Result Value Ref Range   Enterococcus faecalis NOT DETECTED NOT DETECTED   Enterococcus Faecium NOT DETECTED NOT DETECTED   Listeria monocytogenes NOT DETECTED NOT DETECTED   Staphylococcus species DETECTED (A) NOT DETECTED   Staphylococcus aureus (BCID) NOT DETECTED NOT DETECTED   Staphylococcus epidermidis NOT DETECTED NOT DETECTED   Staphylococcus lugdunensis NOT DETECTED NOT DETECTED   Streptococcus species NOT DETECTED NOT DETECTED   Streptococcus agalactiae NOT DETECTED NOT DETECTED   Streptococcus pneumoniae NOT DETECTED NOT DETECTED   Streptococcus pyogenes NOT DETECTED NOT DETECTED   A.calcoaceticus-baumannii NOT DETECTED NOT DETECTED   Bacteroides fragilis NOT DETECTED NOT DETECTED   Enterobacterales NOT DETECTED NOT DETECTED   Enterobacter cloacae complex NOT DETECTED NOT DETECTED   Escherichia coli NOT DETECTED NOT DETECTED   Klebsiella aerogenes NOT DETECTED NOT DETECTED   Klebsiella oxytoca NOT DETECTED NOT DETECTED   Klebsiella pneumoniae NOT DETECTED NOT DETECTED   Proteus species NOT DETECTED NOT DETECTED   Salmonella species NOT DETECTED NOT DETECTED   Serratia marcescens NOT DETECTED NOT DETECTED   Haemophilus influenzae NOT DETECTED NOT DETECTED   Neisseria meningitidis NOT DETECTED NOT DETECTED    Pseudomonas aeruginosa NOT DETECTED NOT DETECTED   Stenotrophomonas maltophilia NOT DETECTED NOT DETECTED   Candida albicans NOT DETECTED NOT DETECTED   Candida auris NOT DETECTED NOT DETECTED   Candida glabrata NOT DETECTED NOT DETECTED   Candida krusei NOT DETECTED NOT DETECTED   Candida parapsilosis NOT DETECTED NOT DETECTED   Candida tropicalis NOT DETECTED NOT DETECTED   Cryptococcus neoformans/gattii NOT DETECTED NOT DETECTED    Prentice Poisson, PharmD Clinical Pharmacist **Pharmacist phone directory can now be found on [email or web address] (PW [email or web address]).  Listed under [facility].

## 2024-05-27 NOTE — ED Notes (Signed)
 NT assisted pt to restroom with wheelchair

## 2024-05-27 NOTE — ED Notes (Signed)
Assisted to BR.

## 2024-05-28 DIAGNOSIS — I5023 Acute on chronic systolic (congestive) heart failure: Secondary | ICD-10-CM

## 2024-05-28 MED ORDER — FUROSEMIDE 10 MG/ML IJ SOLN: 20.0000 mg | Freq: Every day | INTRAMUSCULAR | Status: DC

## 2024-05-28 MED ORDER — ENSURE PLUS HIGH PROTEIN PO LIQD
237.0000 mL | Freq: Two times a day (BID) | ORAL | Status: DC
  Administered 2024-05-29: 237 mL via ORAL

## 2024-05-28 MED ORDER — FUROSEMIDE 10 MG/ML IJ SOLN
20.0000 mg | Freq: Two times a day (BID) | INTRAMUSCULAR | Status: AC
  Administered 2024-05-28: 20 mg via INTRAVENOUS
  Filled 2024-05-28: qty 2

## 2024-05-28 MED ORDER — ADULT MULTIVITAMIN W/MINERALS CH
1.0000 | ORAL_TABLET | Freq: Every day | ORAL | Status: DC
  Administered 2024-05-28 – 2024-05-29 (×2): 1 via ORAL
  Filled 2024-05-28 (×2): qty 1

## 2024-05-28 NOTE — Care Management Obs Status (Signed)
 MEDICARE OBSERVATION STATUS NOTIFICATION   Patient Details  Name: Cindy Mccormick MRN: [id number] Date of Birth: 27-May-1928   Medicare Observation Status Notification Given:  Yes    Marval Gell, RN 05/28/2024, 10:33 AM

## 2024-05-28 NOTE — Care Management CC44 (Signed)
 Condition Code 44 Documentation Completed  Patient Details  Name: Cindy Mccormick MRN: [id number] Date of Birth: 03-22-1928   Condition Code 44 given:  Yes Patient signature on Condition Code 44 notice:  Yes Documentation of 2 MD's agreement:  Yes Code 44 added to claim:  Yes    Marval Gell, RN 05/28/2024, 10:34 AM

## 2024-05-28 NOTE — Progress Notes (Addendum)
 PROGRESS NOTE    Cindy Mccormick  FMW:[id number] DOB: 1927-10-22 DOA: 05/27/2024 PCP: Delice Charleston, MD (Inactive)  89/F with long history of chronic systolic CHF, not very symptomatic, vascular disease, diverticulitis, hypertension, dyslipidemia presented to the ED with shortness of breath, chest tightness and cough, reported intermittent leg swelling after standing or walking in the ER mildly hypoxic placed on 2 L O2, chest x-ray noted bibasilar opacities, small pleural effusion, BNP 07/10/2002, troponin 26, creatinine 0.8   Subjective: -Feels better, breathing is improving  Assessment and Plan:  Acute on chronic systolic CHF - EF was 30-35% in 2019, now down to 20-25% - Continue IV Lasix  today, Aldactone  - Add low-dose ARB tomorrow if blood pressure tolerates, conservative management considering advanced age frailty - Discontinue diltiazem - Increase activity, PT OT eval  Acute hypoxic respiratory failure Resolved  Mild leukocytosis Likely reactive will repeat  Moderate protein calorie malnutrition  Hypothyroidism, TSH elevated 5 Synthroid  dose increased to 50 mcg    DVT prophylaxis: Lovenox  Code Status: DNR Family Communication: Son and daughter-in-law at bedside Disposition Plan: Home likely  Consultants:    Procedures:   Antimicrobials:    Objective: Vitals:   05/28/24 0436 05/28/24 0500 05/28/24 0723 05/28/24 1051  BP: 120/68  (!) 111/54 119/82  Pulse: 91  87 93  Resp: 15  20 19   Temp: 98.3 F (36.8 C)  97.9 F (36.6 C) 97.9 F (36.6 C)  TempSrc: Oral  Oral Oral  SpO2: 97%  97% 97%  Weight:  36.9 kg    Height:        Intake/Output Summary (Last 24 hours) at 05/28/2024 1148 Last data filed at 05/28/2024 1047 Gross per 24 hour  Intake 118 ml  Output 301 ml  Net -183 ml   Filed Weights   05/27/24 0800 05/27/24 1313 05/28/24 0500  Weight: 39.9 kg 39.4 kg 36.9 kg    Examination:  General exam: Appears calm and comfortable  HEENT: Positive  JVD Respiratory system: Deep creased breath sounds at the bases Cardiovascular system: S1 & S2 heard, RRR.  Abd: nondistended, soft and nontender.Normal bowel sounds heard. Central nervous system: Alert and oriented. No focal neurological deficits. Extremities: Trace edema Skin: No rashes Psychiatry:  Mood & affect appropriate.     Data Reviewed:   CBC: Recent Labs  Lab 05/27/24 0013 05/27/24 0029  WBC 14.8*  --   NEUTROABS 12.4*  --   HGB 12.6 12.6  HCT 38.9 37.0  MCV 99.5  --   PLT 352  --    Basic Metabolic Panel: Recent Labs  Lab 05/27/24 0013 05/27/24 0029  NA 141 138  K 4.7 4.6  CL 106 107  CO2 20*  --   GLUCOSE 112* 111*  BUN 16 26*  CREATININE 0.87 0.80  CALCIUM 8.5*  --    GFR: Estimated Creatinine Clearance: 25 mL/min (by C-G formula based on SCr of 0.8 mg/dL). Liver Function Tests: Recent Labs  Lab 05/27/24 0013  AST 32  ALT 11  ALKPHOS 84  BILITOT 1.3*  PROT 6.0*  ALBUMIN 2.9*   No results for input(s): LIPASE, AMYLASE in the last 168 hours. No results for input(s): AMMONIA in the last 168 hours. Coagulation Profile: No results for input(s): INR, PROTIME in the last 168 hours. Cardiac Enzymes: No results for input(s): CKTOTAL, CKMB, CKMBINDEX, TROPONINI in the last 168 hours. BNP (last 3 results) No results for input(s): PROBNP in the last 8760 hours. HbA1C: No results for input(s): HGBA1C in  the last 72 hours. CBG: No results for input(s): GLUCAP in the last 168 hours. Lipid Profile: No results for input(s): CHOL, HDL, LDLCALC, TRIG, CHOLHDL, LDLDIRECT in the last 72 hours. Thyroid  Function Tests: Recent Labs    05/27/24 0809  TSH 5.000*   Anemia Panel: No results for input(s): VITAMINB12, FOLATE, FERRITIN, TIBC, IRON, RETICCTPCT in the last 72 hours. Urine analysis:    Component Value Date/Time   COLORURINE YELLOW 11/12/2013 1145   APPEARANCEUR CLOUDY (A) 11/12/2013 1145    LABSPEC 1.005 02/23/2017 1114   PHURINE 6.0 02/23/2017 1114   PHURINE 5.0 11/12/2013 1145   GLUCOSEU Negative 02/23/2017 1114   HGBUR Negative 02/23/2017 1114   HGBUR LARGE (A) 11/12/2013 1145   BILIRUBINUR Negative 02/23/2017 1114   KETONESUR Negative 02/23/2017 1114   KETONESUR NEGATIVE 11/12/2013 1145   PROTEINUR < 30 02/23/2017 1114   PROTEINUR >300 (A) 11/12/2013 1145   UROBILINOGEN 0.2 02/23/2017 1114   NITRITE Negative 02/23/2017 1114   NITRITE NEGATIVE 11/12/2013 1145   LEUKOCYTESUR Trace 02/23/2017 1114   Sepsis Labs: @LABRCNTIP (procalcitonin:4,lacticidven:4)  ) Recent Results (from the past 240 hours)  Resp panel by RT-PCR (RSV, Flu A&B, Covid) Anterior Nasal Swab     Status: None   Collection Time: 05/27/24 12:25 AM   Specimen: Anterior Nasal Swab  Result Value Ref Range Status   SARS Coronavirus 2 by RT PCR NEGATIVE NEGATIVE Final   Influenza A by PCR NEGATIVE NEGATIVE Final   Influenza B by PCR NEGATIVE NEGATIVE Final    Comment: (NOTE) The Xpert Xpress SARS-CoV-2/FLU/RSV plus assay is intended as an aid in the diagnosis of influenza from Nasopharyngeal swab specimens and should not be used as a sole basis for treatment. Nasal washings and aspirates are unacceptable for Xpert Xpress SARS-CoV-2/FLU/RSV testing.  Fact Sheet for Patients: bloggercourse.com  Fact Sheet for Healthcare Providers: seriousbroker.it  This test is not yet approved or cleared by the [place]  FDA and has been authorized for detection and/or diagnosis of SARS-CoV-2 by FDA under an Emergency Use Authorization (EUA). This EUA will remain in effect (meaning this test can be used) for the duration of the COVID-19 declaration under Section 564(b)(1) of the Act, 21 U.S.C. section 360bbb-3(b)(1), unless the authorization is terminated or revoked.     Resp Syncytial Virus by PCR NEGATIVE NEGATIVE Final    Comment: (NOTE) Fact Sheet for  Patients: bloggercourse.com  Fact Sheet for Healthcare Providers: seriousbroker.it  This test is not yet approved or cleared by the [place]  FDA and has been authorized for detection and/or diagnosis of SARS-CoV-2 by FDA under an Emergency Use Authorization (EUA). This EUA will remain in effect (meaning this test can be used) for the duration of the COVID-19 declaration under Section 564(b)(1) of the Act, 21 U.S.C. section 360bbb-3(b)(1), unless the authorization is terminated or revoked.  Performed at [facility], [address], [place], [place] [address]   Culture, blood (routine x 2)     Status: None (Preliminary result)   Collection Time: 05/27/24  1:15 AM   Specimen: BLOOD LEFT ARM  Result Value Ref Range Status   Specimen Description BLOOD LEFT ARM  Final   Special Requests   Final    BOTTLES DRAWN AEROBIC AND ANAEROBIC Blood Culture adequate volume   Culture  Setup Time   Final    GRAM POSITIVE COCCI IN CLUSTERS AEROBIC BOTTLE ONLY CRITICAL RESULT CALLED TO, READ BACK BY AND VERIFIED WITH: MAYA PRENTICE POISSON [phone] AT 2001 BY EC  Performed at [facility], [address], [place], [place] [address]    Culture GRAM POSITIVE COCCI  Final   Report Status PENDING  Incomplete  Blood Culture ID Panel (Reflexed)     Status: Abnormal   Collection Time: 05/27/24  1:15 AM  Result Value Ref Range Status   Enterococcus faecalis NOT DETECTED NOT DETECTED Final   Enterococcus Faecium NOT DETECTED NOT DETECTED Final   Listeria monocytogenes NOT DETECTED NOT DETECTED Final   Staphylococcus species DETECTED (A) NOT DETECTED Final    Comment: CRITICAL RESULT CALLED TO, READ BACK BY AND VERIFIED WITH: MAYA PRENTICE POISSON [phone] AT 2001 BY EC    Staphylococcus aureus (BCID) NOT DETECTED NOT DETECTED Final   Staphylococcus epidermidis NOT DETECTED NOT DETECTED Final   Staphylococcus lugdunensis NOT DETECTED NOT  DETECTED Final   Streptococcus species NOT DETECTED NOT DETECTED Final   Streptococcus agalactiae NOT DETECTED NOT DETECTED Final   Streptococcus pneumoniae NOT DETECTED NOT DETECTED Final   Streptococcus pyogenes NOT DETECTED NOT DETECTED Final   A.calcoaceticus-baumannii NOT DETECTED NOT DETECTED Final   Bacteroides fragilis NOT DETECTED NOT DETECTED Final   Enterobacterales NOT DETECTED NOT DETECTED Final   Enterobacter cloacae complex NOT DETECTED NOT DETECTED Final   Escherichia coli NOT DETECTED NOT DETECTED Final   Klebsiella aerogenes NOT DETECTED NOT DETECTED Final   Klebsiella oxytoca NOT DETECTED NOT DETECTED Final   Klebsiella pneumoniae NOT DETECTED NOT DETECTED Final   Proteus species NOT DETECTED NOT DETECTED Final   Salmonella species NOT DETECTED NOT DETECTED Final   Serratia marcescens NOT DETECTED NOT DETECTED Final   Haemophilus influenzae NOT DETECTED NOT DETECTED Final   Neisseria meningitidis NOT DETECTED NOT DETECTED Final   Pseudomonas aeruginosa NOT DETECTED NOT DETECTED Final   Stenotrophomonas maltophilia NOT DETECTED NOT DETECTED Final   Candida albicans NOT DETECTED NOT DETECTED Final   Candida auris NOT DETECTED NOT DETECTED Final   Candida glabrata NOT DETECTED NOT DETECTED Final   Candida krusei NOT DETECTED NOT DETECTED Final   Candida parapsilosis NOT DETECTED NOT DETECTED Final   Candida tropicalis NOT DETECTED NOT DETECTED Final   Cryptococcus neoformans/gattii NOT DETECTED NOT DETECTED Final    Comment: Performed at [facility], [address] [place], [place] [address]  Respiratory (~20 pathogens) panel by PCR     Status: None   Collection Time: 05/27/24  9:30 AM   Specimen: Nasopharyngeal Swab; Respiratory  Result Value Ref Range Status   Adenovirus NOT DETECTED NOT DETECTED Final   Coronavirus 229E NOT DETECTED NOT DETECTED Final    Comment: (NOTE) The Coronavirus on the Respiratory Panel, DOES NOT test for the novel  Coronavirus  (2019 nCoV)    Coronavirus HKU1 NOT DETECTED NOT DETECTED Final   Coronavirus NL63 NOT DETECTED NOT DETECTED Final   Coronavirus OC43 NOT DETECTED NOT DETECTED Final   Metapneumovirus NOT DETECTED NOT DETECTED Final   Rhinovirus / Enterovirus NOT DETECTED NOT DETECTED Final   Influenza A NOT DETECTED NOT DETECTED Final   Influenza B NOT DETECTED NOT DETECTED Final   Parainfluenza Virus 1 NOT DETECTED NOT DETECTED Final   Parainfluenza Virus 2 NOT DETECTED NOT DETECTED Final   Parainfluenza Virus 3 NOT DETECTED NOT DETECTED Final   Parainfluenza Virus 4 NOT DETECTED NOT DETECTED Final   Respiratory Syncytial Virus NOT DETECTED NOT DETECTED Final   Bordetella pertussis NOT DETECTED NOT DETECTED Final   Bordetella Parapertussis NOT DETECTED NOT DETECTED Final   Chlamydophila  pneumoniae NOT DETECTED NOT DETECTED Final   Mycoplasma pneumoniae NOT DETECTED NOT DETECTED Final    Comment: Performed at [facility], [address], [place], [place] [address]     Radiology Studies: ECHOCARDIOGRAM COMPLETE Result Date: 05/27/2024    ECHOCARDIOGRAM REPORT   Patient Name:   Cindy Mccormick Date of Exam: 05/27/2024 Medical Rec #:  [id number]        Height:       59.5 in Accession #:    [id number]       Weight:       88.0 lb Date of Birth:  [birth year]        BSA:          1.308 m Patient Age:    89 years         BP:           104/58 mmHg Patient Gender: F                HR:           76 bpm. Exam Location:  Inpatient Procedure: 2D Echo, Cardiac Doppler, Color Doppler and Intracardiac            Opacification Agent (Both Spectral and Color Flow Doppler were            utilized during procedure). Indications:    CHF I50.9  History:        Patient has prior history of Echocardiogram examinations, most                 recent 09/03/2017. CHF, Arrythmias:LBBB,                 Signs/Symptoms:Hypotension; Risk Factors:Hypertension and                 Dyslipidemia.  Sonographer:    Thea Norlander RCS Referring  Phys: YETTA HERO PATEL IMPRESSIONS  1. Left ventricular ejection fraction, by estimation, is 20 to 25%. The left ventricle has severely decreased function. The left ventricle demonstrates global hypokinesis. Left ventricular diastolic parameters are indeterminate. Elevated left atrial pressure.  2. Right ventricular systolic function is normal. The right ventricular size is normal.  3. Moderate pleural effusion in the left lateral region.  4. The mitral valve is normal in structure. Mild mitral valve regurgitation. Mild mitral stenosis. Severe mitral annular calcification.  5. The aortic valve is tricuspid. Aortic valve regurgitation is mild. Mild aortic valve stenosis.  6. The inferior vena cava is normal in size with greater than 50% respiratory variability, suggesting right atrial pressure of 3 mmHg. Comparison(s): No prior Echocardiogram. FINDINGS  Left Ventricle: Left ventricular ejection fraction, by estimation, is 20 to 25%. The left ventricle has severely decreased function. The left ventricle demonstrates global hypokinesis. Definity  contrast agent was given IV to delineate the left ventricular endocardial borders. The left ventricular internal cavity size was normal in size. There is no left ventricular hypertrophy. Left ventricular diastolic parameters are indeterminate. Elevated left atrial pressure. Right Ventricle: The right ventricular size is normal. Right ventricular systolic function is normal. Left Atrium: Left atrial size was normal in size. Right Atrium: Right atrial size was normal in size. Pericardium: Trivial pericardial effusion is present. Mitral Valve: The mitral valve is normal in structure. Severe mitral annular calcification. Mild mitral valve regurgitation. Mild mitral valve stenosis. MV peak gradient, 9.7 mmHg. The mean mitral valve gradient is 4.5 mmHg. Tricuspid Valve: The tricuspid valve is normal in structure. Tricuspid valve regurgitation  is trivial. No evidence of tricuspid  stenosis. Aortic Valve: The aortic valve is tricuspid. Aortic valve regurgitation is mild. Mild aortic stenosis is present. Aortic valve mean gradient measures 8.5 mmHg. Aortic valve peak gradient measures 16.1 mmHg. Aortic valve area, by VTI measures 1.90 cm. Pulmonic Valve: The pulmonic valve was normal in structure. Pulmonic valve regurgitation is mild. No evidence of pulmonic stenosis. Aorta: The aortic root is normal in size and structure. Venous: The inferior vena cava is normal in size with greater than 50% respiratory variability, suggesting right atrial pressure of 3 mmHg. IAS/Shunts: No atrial level shunt detected by color flow Doppler. Additional Comments: There is a moderate pleural effusion in the left lateral region.  LEFT VENTRICLE PLAX 2D LVIDd:         4.40 cm   Diastology LVIDs:         4.10 cm   LV e' medial:    3.70 cm/s LV PW:         0.80 cm   LV E/e' medial:  25.7 LV IVS:        0.70 cm   LV e' lateral:   7.38 cm/s LVOT diam:     2.10 cm   LV E/e' lateral: 12.9 LV SV:         73 LV SV Index:   56 LVOT Area:     3.46 cm  RIGHT VENTRICLE             IVC RV S prime:     14.80 cm/s  IVC diam: 1.10 cm TAPSE (M-mode): 2.0 cm LEFT ATRIUM             Index        RIGHT ATRIUM           Index LA diam:        3.10 cm 2.37 cm/m   RA Area:     12.50 cm LA Vol (A2C):   45.3 ml 34.62 ml/m  RA Volume:   25.50 ml  19.49 ml/m LA Vol (A4C):   40.4 ml 30.88 ml/m LA Biplane Vol: 42.8 ml 32.71 ml/m  AORTIC VALVE AV Area (Vmax):    1.87 cm AV Area (Vmean):   1.87 cm AV Area (VTI):     1.90 cm AV Vmax:           200.50 cm/s AV Vmean:          134.000 cm/s AV VTI:            0.382 m AV Peak Grad:      16.1 mmHg AV Mean Grad:      8.5 mmHg LVOT Vmax:         108.00 cm/s LVOT Vmean:        72.267 cm/s LVOT VTI:          0.210 m LVOT/AV VTI ratio: 0.55  AORTA Ao Root diam: 3.30 cm Ao Asc diam:  3.30 cm MITRAL VALVE                TRICUSPID VALVE MV Area (PHT): 4.21 cm     TR Peak grad:   6.4 mmHg MV Area  VTI:   2.61 cm     TR Vmax:        126.00 cm/s MV Peak grad:  9.7 mmHg MV Mean grad:  4.5 mmHg     SHUNTS MV Vmax:       1.56 m/s     Systemic VTI:  0.21 m MV Vmean:      100.5 cm/s   Systemic Diam: 2.10 cm MV Decel Time: 180 msec MV E velocity: 95.20 cm/s MV A velocity: 145.00 cm/s MV E/A ratio:  0.66 Redell Shallow MD Electronically signed by Redell Shallow MD Signature Date/Time: 05/27/2024/2:49:51 PM    Final    DG Chest 2 View Result Date: 05/27/2024 EXAM: 2 VIEW(S) XRAY OF THE CHEST 05/27/2024 12:55:05 AM COMPARISON: 03/24/2019 CLINICAL HISTORY: CP CP FINDINGS: LUNGS AND PLEURA: Small right pleural effusion. Bibasilar airspace opacities, left greater than right, atelectasis or infiltrates. No pneumothorax. HEART AND MEDIASTINUM: Aortic atherosclerosis. No acute abnormality of the cardiac and mediastinal silhouettes. BONES AND SOFT TISSUES: No acute osseous abnormality. IMPRESSION: 1. Bibasilar airspace opacities, left greater than right, which may represent atelectasis or infiltrates. 2. Small right pleural effusion. Electronically signed by: Franky Crease MD 05/27/2024 01:00 AM EST RP Workstation: [id number]     Scheduled Meds:  benzonatate   200 mg Oral BID   enoxaparin  (LOVENOX ) injection  20 mg Subcutaneous Q24H   furosemide   20 mg Intravenous Daily   guaiFENesin   600 mg Oral BID   levothyroxine   50 mcg Oral Q0600   pantoprazole   40 mg Oral Daily   spironolactone   25 mg Oral Daily   Continuous Infusions:   LOS: 1 day    Time spent:    Sigurd Pac, MD [facility]   05/28/2024, 11:48 AM

## 2024-05-28 NOTE — Plan of Care (Signed)
 Problem: Education: Goal: Knowledge of General Education information will improve Description: Including pain rating scale, medication(s)/side effects and non-pharmacologic comfort measures Outcome: Progressing   Problem: Health Behavior/Discharge Planning: Goal: Ability to manage health-related needs will improve Outcome: Progressing   Problem: Clinical Measurements: Goal: Ability to maintain clinical measurements within normal limits will improve Outcome: Progressing Goal: Will remain free from infection Outcome: Progressing Goal: Diagnostic test results will improve Outcome: Progressing Goal: Respiratory complications will improve Outcome: Progressing Goal: Cardiovascular complication will be avoided Outcome: Progressing   Problem: Activity: Goal: Risk for activity intolerance will decrease Outcome: Progressing   Problem: Nutrition: Goal: Adequate nutrition will be maintained Outcome: Progressing   Problem: Coping: Goal: Level of anxiety will decrease Outcome: Progressing   Problem: Elimination: Goal: Will not experience complications related to bowel motility Outcome: Progressing Goal: Will not experience complications related to urinary retention Outcome: Progressing   Problem: Pain Managment: Goal: General experience of comfort will improve and/or be controlled Outcome: Progressing   Problem: Safety: Goal: Ability to remain free from injury will improve Outcome: Progressing   Problem: Skin Integrity: Goal: Risk for impaired skin integrity will decrease Outcome: Progressing   Problem: Education: Goal: Ability to demonstrate management of disease process will improve Outcome: Progressing Goal: Ability to verbalize understanding of medication therapies will improve Outcome: Progressing Goal: Individualized Educational Video(s) Outcome: Progressing   Problem: Activity: Goal: Capacity to carry out activities will improve Outcome: Progressing    Problem: Cardiac: Goal: Ability to achieve and maintain adequate cardiopulmonary perfusion will improve Outcome: Progressing   Problem: Education: Goal: Knowledge of General Education information will improve Description: Including pain rating scale, medication(s)/side effects and non-pharmacologic comfort measures Outcome: Progressing   Problem: Health Behavior/Discharge Planning: Goal: Ability to manage health-related needs will improve Outcome: Progressing   Problem: Clinical Measurements: Goal: Ability to maintain clinical measurements within normal limits will improve Outcome: Progressing Goal: Will remain free from infection Outcome: Progressing Goal: Diagnostic test results will improve Outcome: Progressing Goal: Respiratory complications will improve Outcome: Progressing Goal: Cardiovascular complication will be avoided Outcome: Progressing   Problem: Activity: Goal: Risk for activity intolerance will decrease Outcome: Progressing   Problem: Nutrition: Goal: Adequate nutrition will be maintained Outcome: Progressing   Problem: Coping: Goal: Level of anxiety will decrease Outcome: Progressing   Problem: Elimination: Goal: Will not experience complications related to bowel motility Outcome: Progressing Goal: Will not experience complications related to urinary retention Outcome: Progressing   Problem: Pain Managment: Goal: General experience of comfort will improve and/or be controlled Outcome: Progressing   Problem: Safety: Goal: Ability to remain free from injury will improve Outcome: Progressing   Problem: Skin Integrity: Goal: Risk for impaired skin integrity will decrease Outcome: Progressing   Problem: Education: Goal: Ability to demonstrate management of disease process will improve Outcome: Progressing Goal: Ability to verbalize understanding of medication therapies will improve Outcome: Progressing Goal: Individualized Educational  Video(s) Outcome: Progressing   Problem: Activity: Goal: Capacity to carry out activities will improve Outcome: Progressing   Problem: Cardiac: Goal: Ability to achieve and maintain adequate cardiopulmonary perfusion will improve Outcome: Progressing   Problem: Education: Goal: Knowledge of General Education information will improve Description: Including pain rating scale, medication(s)/side effects and non-pharmacologic comfort measures Outcome: Progressing   Problem: Health Behavior/Discharge Planning: Goal: Ability to manage health-related needs will improve Outcome: Progressing   Problem: Clinical Measurements: Goal: Ability to maintain clinical measurements within normal limits will improve Outcome: Progressing Goal: Will remain free from infection Outcome: Progressing Goal: Diagnostic test  results will improve Outcome: Progressing Goal: Respiratory complications will improve Outcome: Progressing Goal: Cardiovascular complication will be avoided Outcome: Progressing   Problem: Activity: Goal: Risk for activity intolerance will decrease Outcome: Progressing   Problem: Nutrition: Goal: Adequate nutrition will be maintained Outcome: Progressing   Problem: Coping: Goal: Level of anxiety will decrease Outcome: Progressing   Problem: Elimination: Goal: Will not experience complications related to bowel motility Outcome: Progressing Goal: Will not experience complications related to urinary retention Outcome: Progressing   Problem: Pain Managment: Goal: General experience of comfort will improve and/or be controlled Outcome: Progressing   Problem: Safety: Goal: Ability to remain free from injury will improve Outcome: Progressing   Problem: Skin Integrity: Goal: Risk for impaired skin integrity will decrease Outcome: Progressing   Problem: Education: Goal: Ability to demonstrate management of disease process will improve Outcome: Progressing Goal:  Ability to verbalize understanding of medication therapies will improve Outcome: Progressing Goal: Individualized Educational Video(s) Outcome: Progressing   Problem: Activity: Goal: Capacity to carry out activities will improve Outcome: Progressing   Problem: Cardiac: Goal: Ability to achieve and maintain adequate cardiopulmonary perfusion will improve Outcome: Progressing   Problem: Education: Goal: Knowledge of General Education information will improve Description: Including pain rating scale, medication(s)/side effects and non-pharmacologic comfort measures Outcome: Progressing   Problem: Health Behavior/Discharge Planning: Goal: Ability to manage health-related needs will improve Outcome: Progressing   Problem: Clinical Measurements: Goal: Ability to maintain clinical measurements within normal limits will improve Outcome: Progressing Goal: Will remain free from infection Outcome: Progressing Goal: Diagnostic test results will improve Outcome: Progressing Goal: Respiratory complications will improve Outcome: Progressing Goal: Cardiovascular complication will be avoided Outcome: Progressing   Problem: Activity: Goal: Risk for activity intolerance will decrease Outcome: Progressing   Problem: Nutrition: Goal: Adequate nutrition will be maintained Outcome: Progressing   Problem: Coping: Goal: Level of anxiety will decrease Outcome: Progressing   Problem: Elimination: Goal: Will not experience complications related to bowel motility Outcome: Progressing Goal: Will not experience complications related to urinary retention Outcome: Progressing   Problem: Pain Managment: Goal: General experience of comfort will improve and/or be controlled Outcome: Progressing   Problem: Safety: Goal: Ability to remain free from injury will improve Outcome: Progressing   Problem: Skin Integrity: Goal: Risk for impaired skin integrity will decrease Outcome: Progressing    Problem: Education: Goal: Ability to demonstrate management of disease process will improve Outcome: Progressing Goal: Ability to verbalize understanding of medication therapies will improve Outcome: Progressing Goal: Individualized Educational Video(s) Outcome: Progressing   Problem: Activity: Goal: Capacity to carry out activities will improve Outcome: Progressing   Problem: Cardiac: Goal: Ability to achieve and maintain adequate cardiopulmonary perfusion will improve Outcome: Progressing

## 2024-05-28 NOTE — Plan of Care (Signed)

## 2024-05-28 NOTE — Progress Notes (Signed)
 Initial Nutrition Assessment  DOCUMENTATION CODES:   Underweight  INTERVENTION:  - Liberalize diet to 2 gram Sodium with fluid restriction given advanced age and low BMI. - Ensure Plus High Protein po BID, each supplement provides 350 kcal and 20 grams of protein. - Magic cup BID with meals, each supplement provides 290 kcal and 9 grams of protein - encourage intake at all meals and of supplements.  - Multivitamin with minerals daily. - Monitor weight trends.   NUTRITION DIAGNOSIS:   Increased nutrient needs related to acute illness as evidenced by estimated needs.  GOAL:   Patient will meet greater than or equal to 90% of their needs  MONITOR:   PO intake, Supplement acceptance, Weight trends, I & O's  REASON FOR ASSESSMENT:   Consult Assessment of nutrition requirement/status  ASSESSMENT:   89 y.o. female with PMH chronic systolic CHF, vascular disease, diverticulitis, HTN, dyslipidemia who presented with SOB, chest tightness and cough, reported intermittent leg swelling after standing or walking.  RD working remotely. Called patient via bedside telephone to obtain nutrition history but unable to reach patient.  Per chart review, no weight history over the past year to assess recent trends.  Patient currently on a heart healthy diet with a fluid restriction. Only meal documented is a 10% of breakfast this AM. Will add ONS to support oral intake.    Medications reviewed and include: Lasix , Protonix   Labs reviewed:  No BMP today   NUTRITION - FOCUSED PHYSICAL EXAM:  RD working remotely  Diet Order:   Diet Order             Diet Heart Room service appropriate? Yes; Fluid consistency: Thin; Fluid restriction: 1800 mL Fluid  Diet effective now                   EDUCATION NEEDS:  No education needs have been identified at this time  Skin:  Skin Assessment: Reviewed RN Assessment  Last BM:  11/22  Height:  Ht Readings from Last 1  Encounters:  05/27/24 4' 10 (1.473 m)   Weight:  Wt Readings from Last 1 Encounters:  05/28/24 36.9 kg    BMI:  Body mass index is 17 kg/m.  Estimated Nutritional Needs:  Kcal:  1300-1500 kcals Protein:  65-75 grams Fluid:  >/= 1.3L or per MD    Trude Ned RD, LDN Contact via Secure Chat.

## 2024-05-28 NOTE — Evaluation (Signed)
 Physical Therapy Evaluation Patient Details Name: Cindy Mccormick MRN: [id number] DOB: [birth year] Today's Date: 05/28/2024  History of Present Illness  The pt is a 89 yo female presenting 11/22 with chest pain, LE edema, and SOB. Admitted for management of HFrEF and acute resp failure with hypoxia. PMH includes: cancer, cardiomyopathy, diverticulitis, HLD, HTN, anemia, LBBB.  Clinical Impression  Pt in bed upon arrival of PT, agreeable to evaluation at this time. Prior to admission the pt was independent without use of DME, reports intermittent use of walking stick when outside but independent with ADLs and IADLs and no hx of falls. The pt was able to complete bed mobility and sit-stand transfer with CGA to minA, benefits from single UE support with gait and is currently limited in endurance, dynamic stability, and LE strength and power. Will continue to benefit from skilled PT acutely to progress functional independence with transfers and mobility for greatest possible return to complete independence. Per pt's family, plan is to d/c home with them to [facility] for short term with HHPT there so that additional supervision and assist is available.          If plan is discharge home, recommend the following: A little help with walking and/or transfers;A little help with bathing/dressing/bathroom;Assistance with cooking/housework;Direct supervision/assist for medications management;Direct supervision/assist for financial management;Assist for transportation;Help with stairs or ramp for entrance   Can travel by private vehicle        Equipment Recommendations BSC/3in1  Recommendations for Other Services       Functional Status Assessment Patient has had a recent decline in their functional status and demonstrates the ability to make significant improvements in function in a reasonable and predictable amount of time.     Precautions / Restrictions Precautions Precautions: None Recall of  Precautions/Restrictions: Intact Restrictions Weight Bearing Restrictions Per Provider Order: No      Mobility  Bed Mobility Overal bed mobility: Needs Assistance Bed Mobility: Supine to Sit     Supine to sit: Modified independent (Device/Increase time)     General bed mobility comments: increased time and use of bed rails    Transfers Overall transfer level: Needs assistance Equipment used: None Transfers: Sit to/from Stand Sit to Stand: Min assist, Contact guard assist           General transfer comment: minA without UE support, CGA from [facility] with armrest    Ambulation/Gait Ambulation/Gait assistance: Min assist Gait Distance (Feet): 35 Feet (+ 20 ft) Assistive device: 1 person hand held assist Gait Pattern/deviations: Step-through pattern, Decreased stride length, Narrow base of support Gait velocity: decreased Gait velocity interpretation: <1.31 ft/sec, indicative of household ambulator   General Gait Details: pt with small steps and limited endurance due to increased urinary frequency and fatigue. VSS on RA.    Balance Overall balance assessment: Needs assistance Sitting-balance support: No upper extremity supported, Feet supported Sitting balance-Leahy Scale: Good     Standing balance support: Single extremity supported, During functional activity Standing balance-Leahy Scale: Fair Standing balance comment: holding single UE support or furniture walkingin room                             Pertinent Vitals/Pain Pain Assessment Pain Assessment: No/denies pain    Home Living Family/patient expects to be discharged to:: Private residence Living Arrangements: Alone Available Help at Discharge: Family;Available PRN/intermittently (sons live in [place]) Type of Home: House Home Access: Stairs to enter Entrance Stairs-Rails: Right Entrance  Stairs-Number of Steps: 2   Home Layout: One level Home Equipment: Rollator (4 wheels);Cane - single  point;Shower seat Additional Comments: per family, plan to return home with son and daughter in law in [place]    Prior Function Prior Level of Function : Needs assist             Mobility Comments: independent, walking stick when outside ADLs Comments: independent, driving, does her own meds, sits on bottom of bath tub to shower     Extremity/Trunk Assessment   Upper Extremity Assessment Upper Extremity Assessment: Defer to OT evaluation    Lower Extremity Assessment Lower Extremity Assessment: Overall WFL for tasks assessed    Cervical / Trunk Assessment Cervical / Trunk Assessment: Normal;Other exceptions Cervical / Trunk Exceptions: frail  Communication   Communication Communication: Impaired Factors Affecting Communication: Hearing impaired    Cognition Arousal: Alert Behavior During Therapy: WFL for tasks assessed/performed   PT - Cognitive impairments: No apparent impairments                         Following commands: Intact       Cueing Cueing Techniques: Verbal cues     General Comments General comments (skin integrity, edema, etc.): VSS on RA    Exercises     Assessment/Plan    PT Assessment Patient needs continued PT services  PT Problem List Decreased strength;Decreased balance;Decreased activity tolerance;Decreased mobility;Cardiopulmonary status limiting activity       PT Treatment Interventions DME instruction;Gait training;Stair training;Functional mobility training;Therapeutic activities;Therapeutic exercise;Balance training;Patient/family education    PT Goals (Current goals can be found in the Care Plan section)  Acute Rehab PT Goals Patient Stated Goal: to return home PT Goal Formulation: With patient/family Time For Goal Achievement: 06/11/24 Potential to Achieve Goals: Good    Frequency Min 2X/week        AM-PAC PT 6 Clicks Mobility  Outcome Measure Help needed turning from your back to your side while in a  flat bed without using bedrails?: None Help needed moving from lying on your back to sitting on the side of a flat bed without using bedrails?: A Little Help needed moving to and from a bed to a chair (including a wheelchair)?: A Little Help needed standing up from a chair using your arms (e.g., wheelchair or bedside chair)?: A Little Help needed to walk in hospital room?: A Little Help needed climbing 3-5 steps with a railing? : A Lot 6 Click Score: 18    End of Session Equipment Utilized During Treatment: Gait belt Activity Tolerance: Patient tolerated treatment well Patient left: in chair;with call bell/phone within reach;with chair alarm set;with family/visitor present Nurse Communication: Mobility status PT Visit Diagnosis: Unsteadiness on feet (R26.81);Other abnormalities of gait and mobility (R26.89);Muscle weakness (generalized) (M62.81)    Time: 8540-8464 PT Time Calculation (min) (ACUTE ONLY): 36 min   Charges:   PT Evaluation $PT Eval Low Complexity: 1 Low PT Treatments $Therapeutic Exercise: 8-22 mins PT General Charges $$ ACUTE PT VISIT: 1 Visit         Izetta Call, PT, DPT   Acute Rehabilitation Department Office [phone] Secure Chat Communication Preferred  Izetta JULIANNA Call 05/28/2024, 5:47 PM

## 2024-05-29 ENCOUNTER — Other Ambulatory Visit (HOSPITAL_COMMUNITY): Payer: Self-pay

## 2024-05-29 DIAGNOSIS — I5023 Acute on chronic systolic (congestive) heart failure: Secondary | ICD-10-CM | POA: Diagnosis not present

## 2024-05-29 DIAGNOSIS — J9601 Acute respiratory failure with hypoxia: Secondary | ICD-10-CM | POA: Diagnosis not present

## 2024-05-29 LAB — BASIC METABOLIC PANEL WITH GFR
Anion gap: 10 (ref 5–15)
BUN: 36 mg/dL — ABNORMAL HIGH (ref 8–23)
CO2: 27 mmol/L (ref 22–32)
Calcium: 8.3 mg/dL — ABNORMAL LOW (ref 8.9–10.3)
Chloride: 103 mmol/L (ref 98–111)
Creatinine, Ser: 1.04 mg/dL — ABNORMAL HIGH (ref 0.44–1.00)
GFR, Estimated: 50 mL/min — ABNORMAL LOW (ref >60)
Glucose, Bld: 99 mg/dL (ref 70–99)
Potassium: 3.9 mmol/L (ref 3.5–5.1)
Sodium: 140 mmol/L (ref 135–145)

## 2024-05-29 LAB — CULTURE, BLOOD (ROUTINE X 2): Special Requests: ADEQUATE

## 2024-05-29 LAB — CBC
HCT: 33.4 % — ABNORMAL LOW (ref 36.0–46.0)
Hemoglobin: 10.8 g/dL — ABNORMAL LOW (ref 12.0–15.0)
MCH: 31.7 pg (ref 26.0–34.0)
MCHC: 32.3 g/dL (ref 30.0–36.0)
MCV: 97.9 fL (ref 80.0–100.0)
Platelets: 318 K/uL (ref 150–400)
RBC: 3.41 MIL/uL — ABNORMAL LOW (ref 3.87–5.11)
RDW: 14.1 % (ref 11.5–15.5)
WBC: 5.6 K/uL (ref 4.0–10.5)
nRBC: 0 % (ref 0.0–0.2)

## 2024-05-29 MED ORDER — FUROSEMIDE 20 MG PO TABS: 20.0000 mg | ORAL_TABLET | Freq: Every day | ORAL | Status: DC

## 2024-05-29 MED ORDER — FUROSEMIDE 20 MG PO TABS
20.0000 mg | ORAL_TABLET | Freq: Every day | ORAL | 0 refills | Status: DC
  Filled 2024-05-29: qty 30, 30d supply, fill #0

## 2024-05-29 NOTE — Progress Notes (Signed)
 Reviewed AVS, patient expressed understanding of medications, MD follow up reviewed.   Removed IV, Site clean, dry and intact.  CCMD contacted and informed patients is being discharged.  Patient states all belongings brought to the hospital at time of admission are accounted for and packed to take home.  Picked up medications from [facility] pharmacy. [facility] contacted to transport patient to entrance A, where family member was waiting in vehicle to transport home.

## 2024-05-29 NOTE — TOC Transition Note (Addendum)
 Transition of Care [facility]) - Discharge Note   Patient Details  Name: IVORIE UPLINGER MRN: [id number] Date of Birth: 1927-12-02  [facility]) CM/SW Contact:  Waddell Barnie Rama, RN Phone Number: 05/29/2024, 10:15 AM   Clinical Narrative:    For dc today, son and DIL is in the room and will transport her to their home.  She will be back at her home on Sunday and that is when they prefer the [facility] for [facility] to start. NCM informed Lynette with [employer].         Patient Goals and CMS Choice            Discharge Placement                       Discharge Plan and Services Additional resources added to the After Visit Summary for                                       Social Drivers of Health (SDOH) Interventions SDOH Screenings   Food Insecurity: No Food Insecurity (05/27/2024)  Housing: Low Risk  (05/27/2024)  Transportation Needs: No Transportation Needs (05/27/2024)  Utilities: Not At Risk (05/27/2024)  Social Connections: Socially Isolated (05/27/2024)  Tobacco Use: Medium Risk (05/27/2024)     Readmission Risk Interventions    05/29/2024   10:03 AM  Readmission Risk Prevention Plan  Post Dischage Appt Complete  Medication Screening Complete  Transportation Screening Complete

## 2024-05-29 NOTE — Progress Notes (Signed)
 Heart Failure Navigator Progress Note  Assessed for Heart & Vascular TOC clinic readiness.  Patient does not meet criteria due to she has a scheduled [facility] appointment on 06/13/2024. No HF TOC.   Navigator will sign off at this time.   Stephane Haddock, BSN, Scientist, Clinical (histocompatibility And Immunogenetics) Only

## 2024-05-29 NOTE — TOC CM/SW Note (Signed)
 Transition of Care [facility]) - Inpatient Brief Assessment   Patient Details  Name: Cindy Mccormick MRN: [id number] Date of Birth: [birth year]  Transition of Care [facility]) CM/SW Contact:    Waddell Barnie Rama, RN Phone Number: 05/29/2024, 10:05 AM   Clinical Narrative: From home alone, has PCP and insurance on file, states has [facility] services in place with [facility] and would like to continue with them.  She has a walker and a cane  at home.  States family member will transport them home at [employer] and family is support system, states would like TOC pharmacy to fill meds prior to dc.  Pta self ambulatory.  PT eval rec HHPT,  she is set up with [facility] for HHPT, HHRN.  Soc will begin on Sunday.    Transition of Care Asessment: Insurance and Status: Insurance coverage has been reviewed Patient has primary care physician: Yes Home environment has been reviewed: home alone, but going to son 's home at [employer] Prior level of function:: indep Prior/Current Home Services: Current home services (walker, cane but do not use) Social Drivers of Health Review: SDOH reviewed no interventions necessary Readmission risk has been reviewed: Yes Transition of care needs: transition of care needs identified, TOC will continue to follow

## 2024-05-29 NOTE — Plan of Care (Signed)

## 2024-05-31 NOTE — Progress Notes (Signed)
 Referring- Reason for referral-congestive heart failure  HPI: 89 year old female for evaluation of congestive heart failure at request of [place]  Cleotilde PA.  Seen previously but not since September 2020. Echo Oct of 2012 showed EF 25 to 30, mild LAE and trace AI and MR. Myoview  was performed in November of 2012 and showed an ejection fraction of 45%. There was a small partially reversible apical defect suggestive of thinning versus mild ischemia. Recently admitted with possible pneumonia and CHF.  She was given diuretic with improvement here.  FU echocardiogram November 2025 showed ejection fraction 20 to 25%, moderate left pleural effusion, mild mitral regurgitation, mild mitral stenosis, mild aortic stenosis, mild aortic insufficiency.  Cardiology asked to evaluate.  Since she was hospitalized patient denies increased dyspnea, chest pain or syncope.  She occasionally feels some dizziness and some pressure in her head.  She has not had syncope.  Her pedal edema has improved.  Current Outpatient Medications  Medication Sig Dispense Refill   ferrous sulfate 325 (65 FE) MG tablet Take 325 mg by mouth every 3 (three) days.     furosemide  (LASIX ) 20 MG tablet Take 1 tablet (20 mg total) by mouth daily. 30 tablet 0   levothyroxine  (SYNTHROID , LEVOTHROID) 25 MCG tablet Take 25 mcg by mouth daily.   3   Multiple Vitamin (MULTIVITAMIN WITH MINERALS) TABS tablet Take 1 tablet by mouth daily.     sennosides-docusate sodium (SENOKOT-S) 8.6-50 MG tablet Take 1 tablet by mouth 2 (two) times daily.     spironolactone  (ALDACTONE ) 50 MG tablet Take 25 mg by mouth daily.     Vitamin D, Cholecalciferol, 25 MCG (1000 UT) CAPS Take 1 capsule by mouth daily.     No current facility-administered medications for this visit.    Allergies  Allergen Reactions   Beta Adrenergic Blockers     Insomnia    Oxycodone Itching   Tramadol  Itching   Ezetimibe     Unknown reaction   Hydrocodone-Acetaminophen  Nausea  Only   Lisinopril  Rash   Nitrofurantoin     Not effective per [facility]   Other Itching and Rash    chemo medication given at [facility] in [place], [place]   Oxycodone Hcl Nausea Only   Sulfa Antibiotics Rash   Sulfonamide Derivatives Rash   Torsemide Itching and Rash     Past Medical History:  Diagnosis Date   Cancer (HCC)    recurrent lymphoma   Cancer (HCC)    carcinoid cancer per outside record   CARDIOMYOPATHY    DIVERTICULITIS, COLON    GERD (gastroesophageal reflux disease)    H/O: hysterectomy 07/07/1975   Hemorrhoids, internal    HYPERLIPIDEMIA    HYPERTENSION    Hypothyroidism    IDA (iron deficiency anemia)    LEFT BUNDLE BRANCH BLOCK    LYMPHOMA    Maintenance chemotherapy    Skin cancer    melanoma diagnosed elsewhere 1999   Vitamin D deficiency     Past Surgical History:  Procedure Laterality Date   APPENDECTOMY  1949   COLONOSCOPY WITH PROPOFOL  N/A 11/16/2013   Procedure: COLONOSCOPY WITH PROPOFOL ;  Surgeon: Toribio SHAUNNA Cedar, MD;  Location: [facility] ENDOSCOPY;  Service: Endoscopy;  Laterality: N/A;   Lymphoma biopsies     TOE SURGERY      Social History   Socioeconomic History   Marital status: Widowed    Spouse name: Not on file   Number of children: 2   Years of education:  Not on file   Highest education level: Not on file  Occupational History   Occupation: retired    Associate Professor: OTHER  Tobacco Use   Smoking status: Never   Smokeless tobacco: Never  Vaping Use   Vaping status: Never Used  Substance and Sexual Activity   Alcohol use: No   Drug use: No   Sexual activity: Not on file  Other Topics Concern   Not on file  Social History Narrative   Not on file   Social Drivers of Health   Financial Resource Strain: Not on file  Food Insecurity: No Food Insecurity (05/27/2024)   Hunger Vital Sign    Worried About Running Out of Food in the Last Year: Never true    Ran Out of Food in the Last Year: Never true   Transportation Needs: No Transportation Needs (05/27/2024)   PRAPARE - Administrator, Civil Service (Medical): No    Lack of Transportation (Non-Medical): No  Physical Activity: Not on file  Stress: Not on file  Social Connections: Socially Isolated (05/27/2024)   Social Connection and Isolation Panel    Frequency of Communication with Friends and Family: Twice a week    Frequency of Social Gatherings with Friends and Family: Twice a week    Attends Religious Services: Never    Database Administrator or Organizations: No    Attends Banker Meetings: Never    Marital Status: Widowed  Intimate Partner Violence: Not At Risk (05/27/2024)   Humiliation, Afraid, Rape, and Kick questionnaire    Fear of Current or Ex-Partner: No    Emotionally Abused: No    Physically Abused: No    Sexually Abused: No    Family History  Problem Relation Age of Onset   Lymphoma Mother    Sudden death Father    Hypertension Neg Hx    Hyperlipidemia Neg Hx    Heart attack Neg Hx    Diabetes Neg Hx    Colon cancer Neg Hx    Esophageal cancer Neg Hx    Liver cancer Neg Hx     ROS: no fevers or chills, productive cough, hemoptysis, dysphasia, odynophagia, melena, hematochezia, dysuria, hematuria, rash, seizure activity, orthopnea, PND, pedal edema, claudication. Remaining systems are negative.  Physical Exam:   Blood pressure (!) 140/70, pulse 85, height 4' 9 (1.448 m), weight 83 lb (37.6 kg), SpO2 99%.  General:  Well developed/frail in NAD Skin warm/dry Patient not depressed No peripheral clubbing Back-normal HEENT-normal/normal eyelids Neck supple/normal carotid upstroke bilaterally; no bruits; no JVD; no thyromegaly chest - CTA/ normal expansion CV - RRR/normal S1 and S2; no rubs or gallops;  PMI nondisplaced, 2/6 systolic murmur left sternal border Abdomen -NT/ND, no HSM, no mass, + bowel sounds, no bruit 2+ femoral pulses, no bruits Ext-no edema, chords, 2+  DP Neuro-grossly nonfocal  ECG - 05/27/24 NSR LBBB personally reviewed  A/P  1 cardiomyopathy-felt to be nonischemic etiology previously.  Question contribution from dyssynchrony/left bundle branch block.  We discussed aggressiveness of care today including procedures and she wants only medical therapy which I think is appropriate.  Will continue Lasix  at present dose and decrease spironolactone  to 12.5 mg daily.  Add losartan  25 mg daily and bisoprolol  2.5 mg daily to see if she tolerates (she had some itching with carvedilol  and Toprol  in the past).  I will have her return in 6 weeks to see APP.  If able we will add SGLT2 inhibitor at that  time.  2 chronic systolic congestive heart failure-she appears to be euvolemic on examination.  Continue Lasix  and spironolactone .  3 hypertension-medication adjustments as outlined above.  I will ask her to follow her blood pressure and we will adjust as needed.  4 aortic stenosis-mild on recent echocardiogram.  Redell Shallow, MD

## 2024-06-01 LAB — CULTURE, BLOOD (ROUTINE X 2): Culture: NO GROWTH

## 2024-06-07 DIAGNOSIS — I5022 Chronic systolic (congestive) heart failure: Secondary | ICD-10-CM | POA: Diagnosis not present

## 2024-06-07 DIAGNOSIS — Z87440 Personal history of urinary (tract) infections: Secondary | ICD-10-CM | POA: Diagnosis not present

## 2024-06-08 NOTE — Discharge Summary (Addendum)
 Physician Discharge Summary  Cindy Mccormick FMW:[id number] DOB: Nov 26, 1927 DOA: 05/27/2024  PCP: Cleotilde, [place]  E, PA  Admit date: 05/27/2024 Discharge date: 05/29/2024  Time spent: 45 minutes  Recommendations for Outpatient Follow-up:  Cardiology Dr. Pietro in few weeks  Discharge Diagnoses:  Principal Problem:   Acute on chronic systolic CHF   Hyperlipidemia   Essential hypertension   Systolic CHF (HCC)   Acute respiratory failure with hypoxia (HCC)   Protein calorie malnutrition   Discharge Condition: Improved  Diet recommendation: Sodium, heart healthy  Filed Weights   05/27/24 1313 05/28/24 0500 05/29/24 0500  Weight: 39.4 kg 36.9 kg 37.3 kg    History of present illness:  89/F with long history of chronic systolic CHF, not very symptomatic, vascular disease, diverticulitis, hypertension, dyslipidemia presented to the ED with shortness of breath, chest tightness and cough, reported intermittent leg swelling after standing or walking in the ER mildly hypoxic placed on 2 L O2, chest x-ray noted bibasilar opacities, small pleural effusion, BNP 07/10/2002, troponin 26, creatinine 0.8   Hospital Course:   Acute on chronic systolic CHF - EF was 30-35% in 2019, now down to 20-25% normal RV, indeterminate diastolic parameters - improved with diuresis, now switched to oral Lasix  and Aldactone  - GDMT limited by soft blood pressures , conservative management considering advanced age frailty - Discontinue diltiazem - Increase activity, PT OT eval completed, discharged home with home health services   Acute hypoxic respiratory failure Resolved   Mild leukocytosis Likely reactive will repeat   Moderate protein calorie malnutrition   Hypothyroidism, TSH elevated 5 Synthroid  dose increased to 50 mcg   Discharge Exam: Vitals:   05/29/24 0431 05/29/24 0826  BP: 112/68 (!) 112/49  Pulse: 75   Resp: 16   Temp: 98 F (36.7 C) 97.7 F (36.5 C)  SpO2: 94%    Gen:  Awake, Alert, Oriented X 3, frail, cachectic HEENT: no JVD Lungs: Good air movement bilaterally, CTAB CVS: S1S2/RRR Abd: soft, Non tender, non distended, BS present Extremities: No edema Skin: no new rashes on exposed skin   Discharge Instructions   Discharge Instructions     Diet - low sodium heart healthy   Complete by: As directed    Increase activity slowly   Complete by: As directed       Allergies as of 05/29/2024       Reactions   Beta Adrenergic Blockers    Insomnia    Oxycodone Itching   Tramadol  Itching   Ezetimibe    Unknown reaction   Hydrocodone-acetaminophen  Nausea Only   Lisinopril  Rash   Nitrofurantoin    Not effective per [facility]   Other Itching, Rash   chemo medication given at [facility] in [place], [place]   Oxycodone Hcl Nausea Only   Sulfa Antibiotics Rash   Sulfonamide Derivatives Rash   Torsemide Itching, Rash        Medication List     STOP taking these medications    diltiazem 120 MG 24 hr capsule Commonly known as: CARDIZEM CD       TAKE these medications    ferrous sulfate 325 (65 FE) MG tablet Take 325 mg by mouth every 3 (three) days.   furosemide  20 MG tablet Commonly known as: LASIX  Take 1 tablet (20 mg total) by mouth daily.   levothyroxine  25 MCG tablet Commonly known as: SYNTHROID  Take 25 mcg by mouth daily.   multivitamin with minerals Tabs tablet Take 1 tablet by mouth daily.  sennosides-docusate sodium 8.6-50 MG tablet Commonly known as: SENOKOT-S Take 1 tablet by mouth 2 (two) times daily.   spironolactone  50 MG tablet Commonly known as: ALDACTONE  Take 25 mg by mouth daily.   Vitamin D (Cholecalciferol) 25 MCG (1000 UT) Caps Take 1 capsule by mouth daily.       Allergies  Allergen Reactions   Beta Adrenergic Blockers     Insomnia    Oxycodone Itching   Tramadol  Itching   Ezetimibe     Unknown reaction   Hydrocodone-Acetaminophen  Nausea Only   Lisinopril  Rash    Nitrofurantoin     Not effective per [facility]   Other Itching and Rash    chemo medication given at [facility] in [place], [place]   Oxycodone Hcl Nausea Only   Sulfa Antibiotics Rash   Sulfonamide Derivatives Rash   Torsemide Itching and Rash    Contact information for follow-up providers     Delice Charleston, MD Follow up.   Specialty: Internal Medicine Why: The office will call patient.SABRA Pass information: [address] [place] [address] [phone]              Contact information for after-discharge care     Home Medical Care     [facility]) Follow up.   Service: Home Health Services Why: Agency will call you to set up apt times Contact information: [address] [place]  [phone]                      The results of significant diagnostics from this hospitalization (including imaging, microbiology, ancillary and laboratory) are listed below for reference.    Significant Diagnostic Studies: ECHOCARDIOGRAM COMPLETE Result Date: 05/27/2024    ECHOCARDIOGRAM REPORT   Patient Name:   Cindy Mccormick Date of Exam: 05/27/2024 Medical Rec #:  [id number]        Height:       59.5 in Accession #:    [id number]       Weight:       88.0 lb Date of Birth:  28-Dec-1927        BSA:          1.308 m Patient Age:    89 years         BP:           104/58 mmHg Patient Gender: F                HR:           76 bpm. Exam Location:  Inpatient Procedure: 2D Echo, Cardiac Doppler, Color Doppler and Intracardiac            Opacification Agent (Both Spectral and Color Flow Doppler were            utilized during procedure). Indications:    CHF I50.9  History:        Patient has prior history of Echocardiogram examinations, most                 recent 09/03/2017. CHF, Arrythmias:LBBB,                 Signs/Symptoms:Hypotension; Risk Factors:Hypertension and                 Dyslipidemia.   Sonographer:    Thea Norlander RCS Referring Phys: YETTA HERO PATEL IMPRESSIONS  1. Left ventricular ejection fraction, by estimation,  is 20 to 25%. The left ventricle has severely decreased function. The left ventricle demonstrates global hypokinesis. Left ventricular diastolic parameters are indeterminate. Elevated left atrial pressure.  2. Right ventricular systolic function is normal. The right ventricular size is normal.  3. Moderate pleural effusion in the left lateral region.  4. The mitral valve is normal in structure. Mild mitral valve regurgitation. Mild mitral stenosis. Severe mitral annular calcification.  5. The aortic valve is tricuspid. Aortic valve regurgitation is mild. Mild aortic valve stenosis.  6. The inferior vena cava is normal in size with greater than 50% respiratory variability, suggesting right atrial pressure of 3 mmHg. Comparison(s): No prior Echocardiogram. FINDINGS  Left Ventricle: Left ventricular ejection fraction, by estimation, is 20 to 25%. The left ventricle has severely decreased function. The left ventricle demonstrates global hypokinesis. Definity  contrast agent was given IV to delineate the left ventricular endocardial borders. The left ventricular internal cavity size was normal in size. There is no left ventricular hypertrophy. Left ventricular diastolic parameters are indeterminate. Elevated left atrial pressure. Right Ventricle: The right ventricular size is normal. Right ventricular systolic function is normal. Left Atrium: Left atrial size was normal in size. Right Atrium: Right atrial size was normal in size. Pericardium: Trivial pericardial effusion is present. Mitral Valve: The mitral valve is normal in structure. Severe mitral annular calcification. Mild mitral valve regurgitation. Mild mitral valve stenosis. MV peak gradient, 9.7 mmHg. The mean mitral valve gradient is 4.5 mmHg. Tricuspid Valve: The tricuspid valve is normal in structure. Tricuspid valve  regurgitation is trivial. No evidence of tricuspid stenosis. Aortic Valve: The aortic valve is tricuspid. Aortic valve regurgitation is mild. Mild aortic stenosis is present. Aortic valve mean gradient measures 8.5 mmHg. Aortic valve peak gradient measures 16.1 mmHg. Aortic valve area, by VTI measures 1.90 cm. Pulmonic Valve: The pulmonic valve was normal in structure. Pulmonic valve regurgitation is mild. No evidence of pulmonic stenosis. Aorta: The aortic root is normal in size and structure. Venous: The inferior vena cava is normal in size with greater than 50% respiratory variability, suggesting right atrial pressure of 3 mmHg. IAS/Shunts: No atrial level shunt detected by color flow Doppler. Additional Comments: There is a moderate pleural effusion in the left lateral region.  LEFT VENTRICLE PLAX 2D LVIDd:         4.40 cm   Diastology LVIDs:         4.10 cm   LV e' medial:    3.70 cm/s LV PW:         0.80 cm   LV E/e' medial:  25.7 LV IVS:        0.70 cm   LV e' lateral:   7.38 cm/s LVOT diam:     2.10 cm   LV E/e' lateral: 12.9 LV SV:         73 LV SV Index:   56 LVOT Area:     3.46 cm  RIGHT VENTRICLE             IVC RV S prime:     14.80 cm/s  IVC diam: 1.10 cm TAPSE (M-mode): 2.0 cm LEFT ATRIUM             Index        RIGHT ATRIUM           Index LA diam:        3.10 cm 2.37 cm/m   RA Area:     12.50 cm LA Vol (A2C):  45.3 ml 34.62 ml/m  RA Volume:   25.50 ml  19.49 ml/m LA Vol (A4C):   40.4 ml 30.88 ml/m LA Biplane Vol: 42.8 ml 32.71 ml/m  AORTIC VALVE AV Area (Vmax):    1.87 cm AV Area (Vmean):   1.87 cm AV Area (VTI):     1.90 cm AV Vmax:           200.50 cm/s AV Vmean:          134.000 cm/s AV VTI:            0.382 m AV Peak Grad:      16.1 mmHg AV Mean Grad:      8.5 mmHg LVOT Vmax:         108.00 cm/s LVOT Vmean:        72.267 cm/s LVOT VTI:          0.210 m LVOT/AV VTI ratio: 0.55  AORTA Ao Root diam: 3.30 cm Ao Asc diam:  3.30 cm MITRAL VALVE                TRICUSPID VALVE MV Area  (PHT): 4.21 cm     TR Peak grad:   6.4 mmHg MV Area VTI:   2.61 cm     TR Vmax:        126.00 cm/s MV Peak grad:  9.7 mmHg MV Mean grad:  4.5 mmHg     SHUNTS MV Vmax:       1.56 m/s     Systemic VTI:  0.21 m MV Vmean:      100.5 cm/s   Systemic Diam: 2.10 cm MV Decel Time: 180 msec MV E velocity: 95.20 cm/s MV A velocity: 145.00 cm/s MV E/A ratio:  0.66 Redell Shallow MD Electronically signed by Redell Shallow MD Signature Date/Time: 05/27/2024/2:49:51 PM    Final    DG Chest 2 View Result Date: 05/27/2024 EXAM: 2 VIEW(S) XRAY OF THE CHEST 05/27/2024 12:55:05 AM COMPARISON: 03/24/2019 CLINICAL HISTORY: CP CP FINDINGS: LUNGS AND PLEURA: Small right pleural effusion. Bibasilar airspace opacities, left greater than right, atelectasis or infiltrates. No pneumothorax. HEART AND MEDIASTINUM: Aortic atherosclerosis. No acute abnormality of the cardiac and mediastinal silhouettes. BONES AND SOFT TISSUES: No acute osseous abnormality. IMPRESSION: 1. Bibasilar airspace opacities, left greater than right, which may represent atelectasis or infiltrates. 2. Small right pleural effusion. Electronically signed by: Franky Crease MD 05/27/2024 01:00 AM EST RP Workstation: [id number]    Microbiology: No results found for this or any previous visit (from the past 240 hours).   Labs: Basic Metabolic Panel: No results for input(s): NA, K, CL, CO2, GLUCOSE, BUN, CREATININE, CALCIUM, MG, PHOS in the last 168 hours. Liver Function Tests: No results for input(s): AST, ALT, ALKPHOS, BILITOT, PROT, ALBUMIN in the last 168 hours. No results for input(s): LIPASE, AMYLASE in the last 168 hours. No results for input(s): AMMONIA in the last 168 hours. CBC: No results for input(s): WBC, NEUTROABS, HGB, HCT, MCV, PLT in the last 168 hours. Cardiac Enzymes: No results for input(s): CKTOTAL, CKMB, CKMBINDEX, TROPONINI in the last 168 hours. BNP: BNP (last 3  results) Recent Labs    05/27/24 0013  BNP 1,504.6*    ProBNP (last 3 results) No results for input(s): PROBNP in the last 8760 hours.  CBG: No results for input(s): GLUCAP in the last 168 hours.     Signed:  Sigurd Pac MD.  [facility] 06/08/2024, 12:41 PM

## 2024-06-09 LAB — LAB REPORT - SCANNED: EGFR: 73

## 2024-06-13 ENCOUNTER — Encounter: Payer: Self-pay | Admitting: Cardiology

## 2024-06-13 ENCOUNTER — Ambulatory Visit: Attending: Cardiology | Admitting: Cardiology

## 2024-06-13 VITALS — BP 140/70 | HR 85 | Ht <= 58 in | Wt 83.0 lb

## 2024-06-13 DIAGNOSIS — I5022 Chronic systolic (congestive) heart failure: Secondary | ICD-10-CM

## 2024-06-13 DIAGNOSIS — I420 Dilated cardiomyopathy: Secondary | ICD-10-CM

## 2024-06-13 MED ORDER — LOSARTAN POTASSIUM 25 MG PO TABS: 25.0000 mg | ORAL_TABLET | Freq: Every day | ORAL | 3 refills | Status: AC

## 2024-06-13 MED ORDER — FUROSEMIDE 20 MG PO TABS: 20.0000 mg | ORAL_TABLET | Freq: Every day | ORAL | 3 refills | Status: AC

## 2024-06-13 MED ORDER — BISOPROLOL FUMARATE 2.5 MG PO TABS: 2.5000 mg | ORAL_TABLET | Freq: Every day | ORAL | 3 refills | Status: AC

## 2024-06-13 MED ORDER — SPIRONOLACTONE 25 MG PO TABS: 12.5000 mg | ORAL_TABLET | Freq: Every day | ORAL | 3 refills | Status: AC

## 2024-06-13 NOTE — Patient Instructions (Signed)
 Medication Instructions:   START SPIRONOLACTONE  12.5 MG ONCE DAILY=1/2 OF THE 25 MG TABLET ONCE DAILY  START LOSARTAN  25 MG ONCE DAILY  START BISOPROLOL  2.5 MG ONE DAILY ' *If you need a refill on your cardiac medications before your next appointment, please call your pharmacy*  Lab Work:  Your physician recommends that you return for lab work in: ONE WEEK-DO NOT NEED TO FAST  If you have labs (blood work) drawn today and your tests are completely normal, you will receive your results only by: MyChart Message (if you have MyChart) OR A paper copy in the mail If you have any lab test that is abnormal or we need to change your treatment, we will call you to review the results.  Follow-Up: At [facility], you and your health needs are our priority.  As part of our continuing mission to provide you with exceptional heart care, our providers are all part of one team.  This team includes your primary Cardiologist (physician) and Advanced Practice Providers or APPs (Physician Assistants and Nurse Practitioners) who all work together to provide you with the care you need, when you need it.  Your next appointment:   6 week(s)  Provider:   One of our Advanced Practice Providers (APPs): Morse Clause, PA-C  Lamarr Satterfield, NP Miriam Shams, NP  Olivia Pavy, PA-C Josefa Beauvais, NP  Leontine Salen, PA-C Orren Fabry, PA-C  [place], PA-C Ernest Dick, NP  Damien Braver, NP Jon Hails, PA-C  Waddell Donath, PA-C    Dayna Dunn, PA-C  Scott Weaver, PA-C Lum Louis, NP Katlyn West, NP Callie Goodrich, PA-C  Xika Zhao, NP Sheng Haley, PA-C    Kathleen Johnson, PA-C   Then, BRIAN CRENSHAW  will plan to see you again in 4 month(s).

## 2024-06-20 LAB — BASIC METABOLIC PANEL WITH GFR
BUN/Creatinine Ratio: 31 — ABNORMAL HIGH (ref 12–28)
BUN: 21 mg/dL (ref 10–36)
CO2: 26 mmol/L (ref 20–29)
Calcium: 9.9 mg/dL (ref 8.7–10.3)
Chloride: 101 mmol/L (ref 96–106)
Creatinine, Ser: 0.68 mg/dL (ref 0.57–1.00)
Glucose: 84 mg/dL (ref 70–99)
Potassium: 4.5 mmol/L (ref 3.5–5.2)
Sodium: 140 mmol/L (ref 134–144)
eGFR: 81 mL/min/1.73 (ref >59)

## 2024-06-21 ENCOUNTER — Ambulatory Visit: Payer: Self-pay | Admitting: Cardiology

## 2024-06-21 NOTE — Telephone Encounter (Signed)
 Letter of results sent to pt

## 2024-06-22 ENCOUNTER — Telehealth: Payer: Self-pay | Admitting: Cardiology

## 2024-06-22 NOTE — Telephone Encounter (Signed)
 Left detailed message informing HHPT that year supply Rx sent to [facility] on 12/9.  Advised to call back if needed Rx sent somewhere else.

## 2024-06-22 NOTE — Telephone Encounter (Signed)
 [facility] calling to get a new prescription for furosemide  (LASIX ) 20 MG tablet  for the pt. Pt is completely out.

## 2024-07-27 NOTE — Progress Notes (Unsigned)
 " Cardiology Office Note:    Date:  07/28/2024   ID:  Cindy Mccormick, DOB 1928/05/12, MRN [id number]  PCP:  Cleotilde, [place]  E, PA   [facility] Providers Cardiologist:  None     Referring MD: Cleotilde, [place]  E, PA   Chief complaint: Follow-up recent hospitalization     History of Present Illness:   Cindy Mccormick is a 89 y.o. female with a hx of CHF, HLD, HTN, vascular disease, diverticulitis presenting to clinic following admission from recent acute on chronic systolic CHF.  Echo Oct of 2012 showed EF 25 to 30, mild LAE and trace AI and MR. Myoview  was performed in November of 2012 and showed an ejection fraction of 45%. There was a small partially reversible apical defect suggestive of thinning versus mild ischemia.  Presented to the ED on 05/27/2024 with complaints of SOB, chest tightness, cough with intermittent leg swelling.  CXR with bibasilar opacities, small pleural effusion, BNP 1504, troponin 26.  Echo 05/27/2024: LVEF 20-25%, global hypokinesis, indeterminate diastolic parameters, moderate pleural effusion, severe mitral annular calcification with mild MV stenosis and regurg.  AV regurgitation mild with mild AV stenosis.  Patient responded well to diuretics, with GDMT for heart failure limited by soft blood pressures.  Diltiazem was discontinued, patient switched to oral Lasix  and spironolactone .   Most recently seen in office with Dr. Pietro on 06/13/2024.  Cardiomyopathy felt to be possibly due to dyssynchrony with left bundle branch block/NICM.  The decision was made to only pursue medical therapy, and avoid procedures.  Presents with her son Genette, doing well from a cardiovascular standpoint. She denies chest pain, palpitations, dyspnea, orthopnea, n, v, dark/tarry/bloody stools, hematuria, dizziness, syncope, edema, weight gain.  She reports her previous lightheadedness has completely resolved following the start of the medications Dr. Pietro prescribed  the last visit.  States she has been routinely doing home health physical therapy, they come to her house once a week, she performs the exercises diligently throughout the week without exacerbation of symptoms.  She does report a history of frequent UTIs in the past.  ROS:   Please see the history of present illness.    All other systems reviewed and are negative.     Past Medical History:  Diagnosis Date   Cancer [facility])    recurrent lymphoma   Cancer (HCC)    carcinoid cancer per outside record   CARDIOMYOPATHY    DIVERTICULITIS, COLON    GERD (gastroesophageal reflux disease)    H/O: hysterectomy 07/07/1975   Hemorrhoids, internal    HYPERLIPIDEMIA    HYPERTENSION    Hypothyroidism    IDA (iron deficiency anemia)    LEFT BUNDLE BRANCH BLOCK    LYMPHOMA    Maintenance chemotherapy    Skin cancer    melanoma diagnosed elsewhere 1999   Vitamin D deficiency     Past Surgical History:  Procedure Laterality Date   APPENDECTOMY  1949   COLONOSCOPY WITH PROPOFOL  N/A 11/16/2013   Procedure: COLONOSCOPY WITH PROPOFOL ;  Surgeon: Toribio SHAUNNA Cedar, MD;  Location: [facility] ENDOSCOPY;  Service: Endoscopy;  Laterality: N/A;   Lymphoma biopsies     TOE SURGERY      Current Medications: Active Medications[1]   Allergies:   Beta adrenergic blockers, Oxycodone, Tramadol , Ezetimibe, Hydrocodone-acetaminophen , Lisinopril , Nitrofurantoin, Other, Oxycodone hcl, Sulfa antibiotics, Sulfonamide derivatives, and Torsemide   Social History   Socioeconomic History   Marital status: Widowed    Spouse name: Not on file   Number  of children: 2   Years of education: Not on file   Highest education level: Not on file  Occupational History   Occupation: retired    Associate Professor: OTHER  Tobacco Use   Smoking status: Never   Smokeless tobacco: Never  Vaping Use   Vaping status: Never Used  Substance and Sexual Activity   Alcohol use: No   Drug use: No   Sexual activity: Not on file  Other Topics  Concern   Not on file  Social History Narrative   Not on file   Social Drivers of Health   Tobacco Use: Low Risk (07/28/2024)   Patient History    Smoking Tobacco Use: Never    Smokeless Tobacco Use: Never    Passive Exposure: Not on file  Recent Concern: Tobacco Use - Medium Risk (06/13/2024)   Patient History    Smoking Tobacco Use: Former    Smokeless Tobacco Use: Never    Passive Exposure: Not on Actuary Strain: Not on file  Food Insecurity: No Food Insecurity (05/27/2024)   Epic    Worried About Programme Researcher, Broadcasting/film/video in the Last Year: Never true    Ran Out of Food in the Last Year: Never true  Transportation Needs: No Transportation Needs (05/27/2024)   Epic    Lack of Transportation (Medical): No    Lack of Transportation (Non-Medical): No  Physical Activity: Not on file  Stress: Not on file  Social Connections: Socially Isolated (05/27/2024)   Social Connection and Isolation Panel    Frequency of Communication with Friends and Family: Twice a week    Frequency of Social Gatherings with Friends and Family: Twice a week    Attends Religious Services: Never    Database Administrator or Organizations: No    Attends Banker Meetings: Never    Marital Status: Widowed  Depression (PHQ2-9): Not on file  Alcohol Screen: Not on file  Housing: Low Risk (05/27/2024)   Epic    Unable to Pay for Housing in the Last Year: No    Number of Times Moved in the Last Year: 0    Homeless in the Last Year: No  Utilities: Not At Risk (05/27/2024)   Epic    Threatened with loss of utilities: No  Health Literacy: Not on file     Family History: The patient's family history includes Lymphoma in her mother; Sudden death in her father. There is no history of Hypertension, Hyperlipidemia, Heart attack, Diabetes, Colon cancer, Esophageal cancer, or Liver cancer.  EKGs/Labs/Other Studies Reviewed:    The following studies were reviewed today:       Recent  Labs: 05/27/2024: ALT 11; B Natriuretic Peptide 1,504.6; TSH 5.000 05/29/2024: Hemoglobin 10.8; Platelets 318 06/20/2024: BUN 21; Creatinine, Ser 0.68; Potassium 4.5; Sodium 140  Recent Lipid Panel    Component Value Date/Time   CHOL 147 11/11/2013 1004   TRIG 362 (H) 11/11/2013 1004   HDL 25 (L) 11/11/2013 1004   CHOLHDL 5.9 11/11/2013 1004   VLDL 72 (H) 11/11/2013 1004   LDLCALC 50 11/11/2013 1004   LDLDIRECT 157.9 04/23/2008 0922     Risk Assessment/Calculations:                Physical Exam:    VS:  BP (!) 118/58   Pulse 71   Ht 4' 11 (1.499 m)   Wt 87 lb (39.5 kg)   SpO2 97%   BMI 17.57 kg/m  Wt Readings from Last 3 Encounters:  07/28/24 87 lb (39.5 kg)  06/13/24 83 lb (37.6 kg)  05/29/24 82 lb 3.7 oz (37.3 kg)     GEN:  Well nourished, well developed in no acute distress HEENT: Normal NECK:  No carotid bruits CARDIAC:  S1-S2 normal, RRR, 2/6 systolic murmur, rubs, gallops RESPIRATORY:  Clear to auscultation without rales, wheezing or rhonchi  MUSCULOSKELETAL:  No edema; No deformity  SKIN: Warm and dry NEUROLOGIC:  Alert and oriented x 3 PSYCHIATRIC:  Normal affect       Assessment & Plan Chronic systolic congestive heart failure (HCC) Previously felt to be nonischemic.  Goals of care discussed at previous visit with Dr. Pietro, preferred to avoid invasive procedures.  Medical management pursued.  Spironolactone  decreased to 12.5 at previous visit, losartan  25 mg daily and bisoprolol  2.5 mg added at previous visit. At today's visit patient appears to have improved following addition of ARB and beta-blocker at prior visit.  Asymptomatic today.  Appears euvolemic, states that her weights ranged from 80-85lbs, never greater than 3 pounds overnight or 5 pounds in a week. Do not believe she is a candidate for SGLT2 inhibitors due to history of frequent UTIs in the past Continue bisoprolol  2.5 mg daily, Lasix  20 mg daily, losartan  25 mg daily,  spironolactone  12.5 mg daily.   Will recheck BMP today Primary hypertension Reports BPs well-controlled at home 118/58 in office today Continue bisoprolol , Lasix , losartan , spironolactone  as above Nonrheumatic aortic valve stenosis Echo 05/27/2024: LVEF 20-25%, global hypokinesis, mild AV regurgitation and stenosis present Systolic murmur auscultated on exam, asymptomatic Performing leg exercises daily as guided by physical therapy Continue to monitor for any signs or symptoms, notify the office if any occur  Disposition: Follow-up in 3 months with Dr. Pietro, or sooner if needed.  Proceed to the ED with any new or worsening symptoms.            Medication Adjustments/Labs and Tests Ordered: Current medicines are reviewed at length with the patient today.  Concerns regarding medicines are outlined above.  Orders Placed This Encounter  Procedures   Basic Metabolic Panel (BMET)   No orders of the defined types were placed in this encounter.   Patient Instructions  Medication Instructions:   Your physician recommends that you continue on your current medications as directed. Please refer to the Current Medication list given to you today.   *If you need a refill on your cardiac medications before your next appointment, please call your pharmacy*   Lab Work:   PLEASE GO DOWN STAIRS  [facility]  FIRST FLOOR   ( GET OFF ELEVATORS WALK TOWARDS WAITING AREA LAB LOCATED BY PHARMACY):   BMET TODAY        If you have labs (blood work) drawn today and your tests are completely normal, you will receive your results only by: MyChart Message (if you have MyChart) OR A paper copy in the mail If you have any lab test that is abnormal or we need to change your treatment, we will call you to review the results.    Testing/Procedures:  NONE ORDERED  TODAY    Follow-Up: At [facility], you and your health needs are our priority.  As part of our continuing mission to provide  you with exceptional heart care, our providers are all part of one team.  This team includes your primary Cardiologist (physician) and Advanced Practice Providers or APPs (Physician Assistants and Nurse Practitioners) who all  work together to provide you with the care you need, when you need it.  Your next appointment:  3 month(s)      Provider:  PIETRO     We recommend signing up for the patient portal called MyChart.  Sign up information is provided on this After Visit Summary.  MyChart is used to connect with patients for Virtual Visits (Telemedicine).  Patients are able to view lab/test results, encounter notes, upcoming appointments, etc.  Non-urgent messages can be sent to your provider as well.   To learn more about what you can do with MyChart, go to [email or web address].   Other Instructions             Signed, Miriam FORBES Shams, NP  07/28/2024 11:33 AM    [facility]     [1]  No outpatient medications have been marked as taking for the 07/28/24 encounter (Office Visit) with Olando Willems E, NP.   "

## 2024-07-28 ENCOUNTER — Ambulatory Visit: Attending: Emergency Medicine | Admitting: Emergency Medicine

## 2024-07-28 ENCOUNTER — Encounter: Payer: Self-pay | Admitting: Emergency Medicine

## 2024-07-28 VITALS — BP 118/58 | HR 71 | Ht 59.0 in | Wt 87.0 lb

## 2024-07-28 DIAGNOSIS — I10 Essential (primary) hypertension: Secondary | ICD-10-CM | POA: Diagnosis not present

## 2024-07-28 DIAGNOSIS — I5022 Chronic systolic (congestive) heart failure: Secondary | ICD-10-CM | POA: Diagnosis not present

## 2024-07-28 DIAGNOSIS — I350 Nonrheumatic aortic (valve) stenosis: Secondary | ICD-10-CM

## 2024-07-28 LAB — BASIC METABOLIC PANEL WITH GFR
BUN/Creatinine Ratio: 22 (ref 12–28)
BUN: 17 mg/dL (ref 10–36)
CO2: 24 mmol/L (ref 20–29)
Calcium: 9.1 mg/dL (ref 8.7–10.3)
Chloride: 105 mmol/L (ref 96–106)
Creatinine, Ser: 0.78 mg/dL (ref 0.57–1.00)
Glucose: 105 mg/dL — ABNORMAL HIGH (ref 70–99)
Potassium: 4.6 mmol/L (ref 3.5–5.2)
Sodium: 142 mmol/L (ref 134–144)
eGFR: 70 mL/min/1.73

## 2024-07-28 NOTE — Assessment & Plan Note (Signed)
 Previously felt to be nonischemic.  Goals of care discussed at previous visit with Dr. Pietro, preferred to avoid invasive procedures.  Medical management pursued.  Spironolactone  decreased to 12.5 at previous visit, losartan  25 mg daily and bisoprolol  2.5 mg added at previous visit. At today's visit patient appears to have improved following addition of ARB and beta-blocker at prior visit.  Asymptomatic today.  Appears euvolemic, states that her weights ranged from 80-85lbs, never greater than 3 pounds overnight or 5 pounds in a week. Do not believe she is a candidate for SGLT2 inhibitors due to history of frequent UTIs in the past Continue bisoprolol  2.5 mg daily, Lasix  20 mg daily, losartan  25 mg daily, spironolactone  12.5 mg daily.   Will recheck BMP today

## 2024-07-28 NOTE — Patient Instructions (Signed)
 Medication Instructions:   Your physician recommends that you continue on your current medications as directed. Please refer to the Current Medication list given to you today.   *If you need a refill on your cardiac medications before your next appointment, please call your pharmacy*   Lab Work:   PLEASE GO DOWN STAIRS  [facility]  FIRST FLOOR   ( GET OFF ELEVATORS WALK TOWARDS WAITING AREA LAB LOCATED BY PHARMACY):   BMET TODAY        If you have labs (blood work) drawn today and your tests are completely normal, you will receive your results only by: MyChart Message (if you have MyChart) OR A paper copy in the mail If you have any lab test that is abnormal or we need to change your treatment, we will call you to review the results.    Testing/Procedures:  NONE ORDERED  TODAY    Follow-Up: At [facility], you and your health needs are our priority.  As part of our continuing mission to provide you with exceptional heart care, our providers are all part of one team.  This team includes your primary Cardiologist (physician) and Advanced Practice Providers or APPs (Physician Assistants and Nurse Practitioners) who all work together to provide you with the care you need, when you need it.  Your next appointment:  3 month(s)      Provider:  PIETRO     We recommend signing up for the patient portal called MyChart.  Sign up information is provided on this After Visit Summary.  MyChart is used to connect with patients for Virtual Visits (Telemedicine).  Patients are able to view lab/test results, encounter notes, upcoming appointments, etc.  Non-urgent messages can be sent to your provider as well.   To learn more about what you can do with MyChart, go to [email or web address].   Other Instructions

## 2024-07-31 ENCOUNTER — Ambulatory Visit: Payer: Self-pay | Admitting: Emergency Medicine

## 2024-08-01 NOTE — Telephone Encounter (Signed)
 Spoke with pt regarding lab results. Pt verbalized understanding and was advised to call our office if she has any concerns.

## 2024-11-02 ENCOUNTER — Ambulatory Visit: Admitting: Cardiology
